# Patient Record
Sex: Male | Born: 1965
Health system: Southern US, Community
[De-identification: ages and names within clinical notes are randomized; demographics above are authoritative.]

## PROBLEM LIST (undated history)

## (undated) DIAGNOSIS — E785 Hyperlipidemia, unspecified: Secondary | ICD-10-CM

## (undated) DIAGNOSIS — F988 Other specified behavioral and emotional disorders with onset usually occurring in childhood and adolescence: Secondary | ICD-10-CM

## (undated) DIAGNOSIS — C4491 Basal cell carcinoma of skin, unspecified: Secondary | ICD-10-CM

## (undated) DIAGNOSIS — I10 Essential (primary) hypertension: Secondary | ICD-10-CM

## (undated) DIAGNOSIS — R0683 Snoring: Secondary | ICD-10-CM

## (undated) DIAGNOSIS — C44310 Basal cell carcinoma of skin of unspecified parts of face: Secondary | ICD-10-CM

## (undated) DIAGNOSIS — T7840XA Allergy, unspecified, initial encounter: Secondary | ICD-10-CM

## (undated) DIAGNOSIS — G4730 Sleep apnea, unspecified: Secondary | ICD-10-CM

## (undated) DIAGNOSIS — R739 Hyperglycemia, unspecified: Secondary | ICD-10-CM

## (undated) HISTORY — PX: EYE SURGERY: SHX253

## (undated) HISTORY — DX: Hyperglycemia, unspecified: R73.9

## (undated) HISTORY — DX: Allergy, unspecified, initial encounter: T78.40XA

## (undated) HISTORY — DX: Snoring: R06.83

## (undated) HISTORY — DX: Basal cell carcinoma of skin, unspecified: C44.91

## (undated) HISTORY — DX: Essential (primary) hypertension: I10

## (undated) HISTORY — DX: Basal cell carcinoma of skin of unspecified parts of face: C44.310

## (undated) HISTORY — DX: Hyperlipidemia, unspecified: E78.5

## (undated) HISTORY — DX: Other specified behavioral and emotional disorders with onset usually occurring in childhood and adolescence: F98.8

---

## 2011-11-13 LAB — PSA

## 2013-02-09 ENCOUNTER — Ambulatory Visit: Payer: Self-pay | Admitting: Specialist

## 2014-01-07 LAB — LIPID PANEL
Cholesterol: 215 mg/dL — AB (ref 0–200)
HDL: 66 mg/dL (ref 35–70)
LDL Cholesterol: 129 mg/dL
Triglycerides: 100 mg/dL (ref 40–160)

## 2014-01-07 LAB — HEMOGLOBIN A1C: Hgb A1c MFr Bld: 5.7 % (ref 4.0–6.0)

## 2014-05-05 HISTORY — PX: LASIK: SHX215

## 2014-10-18 DIAGNOSIS — D239 Other benign neoplasm of skin, unspecified: Secondary | ICD-10-CM

## 2014-10-18 HISTORY — DX: Other benign neoplasm of skin, unspecified: D23.9

## 2014-11-13 ENCOUNTER — Encounter: Payer: Self-pay | Admitting: Family Medicine

## 2014-11-13 DIAGNOSIS — F988 Other specified behavioral and emotional disorders with onset usually occurring in childhood and adolescence: Secondary | ICD-10-CM | POA: Insufficient documentation

## 2014-11-13 DIAGNOSIS — E785 Hyperlipidemia, unspecified: Secondary | ICD-10-CM | POA: Insufficient documentation

## 2014-11-13 DIAGNOSIS — J309 Allergic rhinitis, unspecified: Secondary | ICD-10-CM | POA: Insufficient documentation

## 2014-11-13 DIAGNOSIS — R739 Hyperglycemia, unspecified: Secondary | ICD-10-CM | POA: Insufficient documentation

## 2014-11-13 DIAGNOSIS — L259 Unspecified contact dermatitis, unspecified cause: Secondary | ICD-10-CM | POA: Insufficient documentation

## 2014-11-13 DIAGNOSIS — J302 Other seasonal allergic rhinitis: Secondary | ICD-10-CM | POA: Insufficient documentation

## 2014-11-13 DIAGNOSIS — J3089 Other allergic rhinitis: Secondary | ICD-10-CM | POA: Insufficient documentation

## 2014-11-13 DIAGNOSIS — I10 Essential (primary) hypertension: Secondary | ICD-10-CM | POA: Insufficient documentation

## 2014-11-13 DIAGNOSIS — C44310 Basal cell carcinoma of skin of unspecified parts of face: Secondary | ICD-10-CM | POA: Insufficient documentation

## 2014-11-15 ENCOUNTER — Ambulatory Visit (INDEPENDENT_AMBULATORY_CARE_PROVIDER_SITE_OTHER): Payer: 59 | Admitting: Family Medicine

## 2014-11-15 ENCOUNTER — Encounter: Payer: Self-pay | Admitting: Family Medicine

## 2014-11-15 VITALS — BP 122/72 | HR 92 | Temp 97.8°F | Resp 16 | Ht 70.0 in | Wt 202.9 lb

## 2014-11-15 DIAGNOSIS — I10 Essential (primary) hypertension: Secondary | ICD-10-CM

## 2014-11-15 DIAGNOSIS — F909 Attention-deficit hyperactivity disorder, unspecified type: Secondary | ICD-10-CM

## 2014-11-15 DIAGNOSIS — E785 Hyperlipidemia, unspecified: Secondary | ICD-10-CM

## 2014-11-15 DIAGNOSIS — F988 Other specified behavioral and emotional disorders with onset usually occurring in childhood and adolescence: Secondary | ICD-10-CM

## 2014-11-15 MED ORDER — AMPHETAMINE-DEXTROAMPHETAMINE 20 MG PO TABS: 20.0000 mg | ORAL_TABLET | Freq: Every day | ORAL | Status: DC

## 2014-11-15 MED ORDER — AMPHETAMINE-DEXTROAMPHETAMINE 10 MG PO TABS: 10.0000 mg | ORAL_TABLET | Freq: Every evening | ORAL | Status: DC

## 2014-11-15 MED ORDER — OLMESARTAN-AMLODIPINE-HCTZ 20-5-12.5 MG PO TABS: 1.0000 | ORAL_TABLET | Freq: Every day | ORAL | Status: DC

## 2014-11-15 MED ORDER — AMPHETAMINE-DEXTROAMPHETAMINE 10 MG PO TABS: 10.0000 mg | ORAL_TABLET | Freq: Every day | ORAL | Status: DC

## 2014-11-15 NOTE — Progress Notes (Signed)
Name: Victor Valdez   MRN: [id number]    DOB: [birth year]   Date:11/15/2014       Progress Note  Subjective  Chief Complaint  Chief Complaint  Patient presents with  . Hypertension  . ADHD    HPI  HTN: taking medications daily, denies side effects, bp is at goal.  Dyslipidemia: he is not on a statin, he is exercising more, riding bikes with his son four to five days weekly   ADD: doing well on medication, takes as prescribed, able to focus, feels like the dose is working well for him, no palpitation, it does decrease his appetite , but not significant.    Patient Active Problem List   Diagnosis Date Noted  . ADD (attention deficit disorder) 11/13/2014  . Basal cell carcinoma of face 11/13/2014  . CD (contact dermatitis) 11/13/2014  . Dyslipidemia 11/13/2014  . Blood glucose elevated 11/13/2014  . Benign hypertension 11/13/2014  . Perennial allergic rhinitis with seasonal variation 11/13/2014    Past Surgical History  Procedure Laterality Date  . Lasik Bilateral 12.30.2015    Family History  Problem Relation Age of Onset  . Hypertension Mother   . Cancer Father     Prostate    History   Social History  . Marital Status: Married    Spouse Name: N/A  . Number of Children: N/A  . Years of Education: N/A   Occupational History  . Not on file.   Social History Main Topics  . Smoking status: Never Smoker   . Smokeless tobacco: Never Used  . Alcohol Use: 0.0 oz/week    0 Standard drinks or equivalent per week  . Drug Use: No  . Sexual Activity: Yes   Other Topics Concern  . Not on file   Social History Narrative     Current outpatient prescriptions:  .  amphetamine-dextroamphetamine (ADDERALL) 10 MG tablet, Take 1 tablet (10 mg total) by mouth every evening., Disp: 30 tablet, Rfl: 0 .  amphetamine-dextroamphetamine (ADDERALL) 20 MG tablet, Take 1 tablet (20 mg total) by mouth daily., Disp: 30 tablet, Rfl: 0 .  cetirizine (ZYRTEC) 10 MG tablet, Take 1  tablet by mouth daily., Disp: , Rfl:  .  mometasone (NASONEX) 50 MCG/ACT nasal spray, Place 2 sprays into the nose at bedtime., Disp: , Rfl:  .  Olmesartan-Amlodipine-HCTZ (TRIBENZOR) 20-5-12.5 MG TABS, Take 1 tablet by mouth daily., Disp: 90 tablet, Rfl: 4 .  amphetamine-dextroamphetamine (ADDERALL) 10 MG tablet, Take 1 tablet (10 mg total) by mouth daily at 2 PM daily at 2 PM., Disp: 30 tablet, Rfl: 0 .  amphetamine-dextroamphetamine (ADDERALL) 10 MG tablet, Take 1 tablet (10 mg total) by mouth daily at 2 PM daily at 2 PM., Disp: 30 tablet, Rfl: 0 .  amphetamine-dextroamphetamine (ADDERALL) 20 MG tablet, Take 1 tablet (20 mg total) by mouth daily., Disp: 30 tablet, Rfl: 0 .  amphetamine-dextroamphetamine (ADDERALL) 20 MG tablet, Take 1 tablet (20 mg total) by mouth daily., Disp: 30 tablet, Rfl: 0  Allergies  Allergen Reactions  . Aspirin   . Penicillins      ROS  Constitutional: Negative for fever or weight change.  Respiratory: Negative for cough and shortness of breath.   Cardiovascular: Negative for chest pain or palpitations.  Gastrointestinal: Negative for abdominal pain, no bowel changes.  Musculoskeletal: Negative for gait problem or joint swelling.  Skin: Negative for rash.  Neurological: Negative for dizziness or headache.  No other specific complaints in a complete review of systems (except  as listed in HPI above).  Objective  Filed Vitals:   11/15/14 0810  BP: 122/72  Pulse: 92  Temp: 97.8 F (36.6 C)  TempSrc: Oral  Resp: 16  Height: 5\' 10"  (1.778 m)  Weight: 202 lb 14.4 oz (92.035 kg)  SpO2: 97%    Body mass index is 29.11 kg/(m^2).  Physical Exam  Constitutional: Patient appears well-developed and well-nourished. No distress.  Eyes:  No scleral icterus. PERL Neck: Normal range of motion. Neck supple. Cardiovascular: Normal rate, regular rhythm and normal heart sounds.  No murmur heard. No BLE edema. Pulmonary/Chest: Effort normal and breath sounds  normal. No respiratory distress. Abdominal: Soft.  There is no tenderness. Psychiatric: Patient has a normal mood and affect. behavior is normal. Judgment and thought content normal.  PHQ2/9: Depression screen PHQ 2/9 11/15/2014  Decreased Interest 0  Down, Depressed, Hopeless 0  PHQ - 2 Score 0     Fall Risk: Fall Risk  11/15/2014  Falls in the past year? No    Assessment & Plan    1. Benign hypertension  - Olmesartan-Amlodipine-HCTZ (TRIBENZOR) 20-5-12.5 MG TABS; Take 1 tablet by mouth daily.  Dispense: 90 tablet; Refill: 4  2. Dyslipidemia  Discussed diet and exercise   3. ADD (attention deficit disorder)  - amphetamine-dextroamphetamine (ADDERALL) 20 MG tablet; Take 1 tablet (20 mg total) by mouth daily.  Dispense: 30 tablet; Refill: 0 - amphetamine-dextroamphetamine (ADDERALL) 10 MG tablet; Take 1 tablet (10 mg total) by mouth every evening.  Dispense: 30 tablet; Refill: 0 - amphetamine-dextroamphetamine (ADDERALL) 10 MG tablet; Take 1 tablet (10 mg total) by mouth daily at 2 PM daily at 2 PM.  Dispense: 30 tablet; Refill: 0 - amphetamine-dextroamphetamine (ADDERALL) 20 MG tablet; Take 1 tablet (20 mg total) by mouth daily.  Dispense: 30 tablet; Refill: 0 - amphetamine-dextroamphetamine (ADDERALL) 10 MG tablet; Take 1 tablet (10 mg total) by mouth daily at 2 PM daily at 2 PM.  Dispense: 30 tablet; Refill: 0 - amphetamine-dextroamphetamine (ADDERALL) 20 MG tablet; Take 1 tablet (20 mg total) by mouth daily.  Dispense: 30 tablet; Refill: 0

## 2014-11-15 NOTE — Addendum Note (Signed)
Addended by: Steele Sizer F on: 11/15/2014 08:39 AM   Modules accepted: Orders, SmartSet

## 2015-01-11 ENCOUNTER — Telehealth: Payer: Self-pay | Admitting: Family Medicine

## 2015-01-11 ENCOUNTER — Other Ambulatory Visit: Payer: Self-pay

## 2015-01-11 DIAGNOSIS — F988 Other specified behavioral and emotional disorders with onset usually occurring in childhood and adolescence: Secondary | ICD-10-CM

## 2015-01-11 NOTE — Telephone Encounter (Signed)
Pt states Aug had 31 days and he needs a refill on Adderell and needs to be able to get this on Friday instead of Saturday to be called into [facility].

## 2015-02-14 ENCOUNTER — Encounter: Payer: Self-pay | Admitting: Family Medicine

## 2015-02-14 ENCOUNTER — Ambulatory Visit (INDEPENDENT_AMBULATORY_CARE_PROVIDER_SITE_OTHER): Payer: 59 | Admitting: Family Medicine

## 2015-02-14 VITALS — BP 106/64 | HR 102 | Temp 98.1°F | Resp 18 | Ht 70.0 in | Wt 199.7 lb

## 2015-02-14 DIAGNOSIS — J309 Allergic rhinitis, unspecified: Secondary | ICD-10-CM

## 2015-02-14 DIAGNOSIS — J3089 Other allergic rhinitis: Secondary | ICD-10-CM

## 2015-02-14 DIAGNOSIS — I10 Essential (primary) hypertension: Secondary | ICD-10-CM | POA: Diagnosis not present

## 2015-02-14 DIAGNOSIS — Z23 Encounter for immunization: Secondary | ICD-10-CM

## 2015-02-14 DIAGNOSIS — E785 Hyperlipidemia, unspecified: Secondary | ICD-10-CM | POA: Diagnosis not present

## 2015-02-14 DIAGNOSIS — F909 Attention-deficit hyperactivity disorder, unspecified type: Secondary | ICD-10-CM | POA: Diagnosis not present

## 2015-02-14 DIAGNOSIS — F988 Other specified behavioral and emotional disorders with onset usually occurring in childhood and adolescence: Secondary | ICD-10-CM

## 2015-02-14 DIAGNOSIS — J302 Other seasonal allergic rhinitis: Secondary | ICD-10-CM

## 2015-02-14 MED ORDER — AMPHETAMINE-DEXTROAMPHETAMINE 10 MG PO TABS: 10.0000 mg | ORAL_TABLET | Freq: Every day | ORAL | Status: DC

## 2015-02-14 MED ORDER — AMPHETAMINE-DEXTROAMPHETAMINE 10 MG PO TABS: 10.0000 mg | ORAL_TABLET | Freq: Every evening | ORAL | Status: DC

## 2015-02-14 MED ORDER — AMPHETAMINE-DEXTROAMPHETAMINE 20 MG PO TABS: 20.0000 mg | ORAL_TABLET | Freq: Every day | ORAL | Status: DC

## 2015-02-14 NOTE — Progress Notes (Signed)
Name: Victor Valdez   MRN: [id number]    DOB: 1966-01-17   Date:02/14/2015       Progress Note  Subjective  Chief Complaint  Chief Complaint  Patient presents with  . Medication Refill    follow-up 3onths  . ADHD  . Hypertension  . Stye    right eye noticed this am no pain    HPI  Hypertension: taking medication and denies side effects, no chest pain or SOB.  Not checking bp at home   ADHD: taking Adderal daily and is able to concentrate and focus, no palpitation, no side effects. No change in appetite  Eye lid problem: noticed a red bump on right lower eye lid, he noticed it a couple of days ago, non-tender  AR: currently under control, no nasal congestion, itchy eyes, or rhinorrhea, he has been off allergy medications.   Patient Active Problem List   Diagnosis Date Noted  . ADD (attention deficit disorder) 11/13/2014  . Basal cell carcinoma of face 11/13/2014  . CD (contact dermatitis) 11/13/2014  . Dyslipidemia 11/13/2014  . Blood glucose elevated 11/13/2014  . Benign hypertension 11/13/2014  . Perennial allergic rhinitis with seasonal variation 11/13/2014    Past Surgical History  Procedure Laterality Date  . Lasik Bilateral 12.30.2015    Family History  Problem Relation Age of Onset  . Hypertension Mother   . Cancer Father     Prostate    Social History   Social History  . Marital Status: Married    Spouse Name: N/A  . Number of Children: N/A  . Years of Education: N/A   Occupational History  . Not on file.   Social History Main Topics  . Smoking status: Never Smoker   . Smokeless tobacco: Never Used  . Alcohol Use: 0.0 oz/week    0 Standard drinks or equivalent per week  . Drug Use: No  . Sexual Activity: Yes   Other Topics Concern  . Not on file   Social History Narrative     Current outpatient prescriptions:  .  amphetamine-dextroamphetamine (ADDERALL) 10 MG tablet, Take 1 tablet (10 mg total) by mouth every evening., Disp: 30  tablet, Rfl: 0 .  amphetamine-dextroamphetamine (ADDERALL) 10 MG tablet, Take 1 tablet (10 mg total) by mouth daily at 2 PM daily at 2 PM., Disp: 30 tablet, Rfl: 0 .  amphetamine-dextroamphetamine (ADDERALL) 10 MG tablet, Take 1 tablet (10 mg total) by mouth daily at 2 PM daily at 2 PM., Disp: 30 tablet, Rfl: 0 .  amphetamine-dextroamphetamine (ADDERALL) 20 MG tablet, Take 1 tablet (20 mg total) by mouth daily., Disp: 30 tablet, Rfl: 0 .  amphetamine-dextroamphetamine (ADDERALL) 20 MG tablet, Take 1 tablet (20 mg total) by mouth daily., Disp: 30 tablet, Rfl: 0 .  amphetamine-dextroamphetamine (ADDERALL) 20 MG tablet, Take 1 tablet (20 mg total) by mouth daily., Disp: 30 tablet, Rfl: 0 .  cetirizine (ZYRTEC) 10 MG tablet, Take 1 tablet by mouth daily., Disp: , Rfl:  .  mometasone (NASONEX) 50 MCG/ACT nasal spray, Place 2 sprays into the nose at bedtime., Disp: , Rfl:  .  Olmesartan-Amlodipine-HCTZ (TRIBENZOR) 20-5-12.5 MG TABS, Take 1 tablet by mouth daily., Disp: 90 tablet, Rfl: 4  Allergies  Allergen Reactions  . Aspirin   . Penicillins      ROS  Constitutional: Negative for fever or weight change.  Respiratory: Negative for cough and shortness of breath.   Cardiovascular: Negative for chest pain or palpitations.  Gastrointestinal: Negative for abdominal pain,  no bowel changes.  Musculoskeletal: Negative for gait problem or joint swelling.  Skin: Negative for rash.  Neurological: Negative for dizziness or headache.  No other specific complaints in a complete review of systems (except as listed in HPI above).  Objective  Filed Vitals:   02/14/15 0927  BP: 106/64  Pulse: 102  Temp: 98.1 F (36.7 C)  TempSrc: Oral  Resp: 18  Height: 5\' 10"  (1.778 m)  Weight: 199 lb 11.2 oz (90.583 kg)  SpO2: 98%    Body mass index is 28.65 kg/(m^2).  Physical Exam  Constitutional: Patient appears well-developed and well-nourished. No distress.  HEENT: head atraumatic, normocephalic,  pupils equal and reactive to light,  neck supple, throat within normal limits, erythematous bump on right lower lid, non - tender, advised to monitor and if no resolution go to dermatologist since he has a history of basal cell carcinoma of face Cardiovascular: Normal rate, regular rhythm and normal heart sounds.  No murmur heard. No BLE edema. Pulmonary/Chest: Effort normal and breath sounds normal. No respiratory distress. Abdominal: Soft.  There is no tenderness. Psychiatric: Patient has a normal mood and affect. behavior is normal. Judgment and thought content normal.  PHQ2/9: Depression screen [facility] 2/9 02/14/2015 11/15/2014  Decreased Interest 0 0  Down, Depressed, Hopeless 0 0  PHQ - 2 Score 0 0    Fall Risk: Fall Risk  02/14/2015 11/15/2014  Falls in the past year? No No     Functional Status Survey: Is the patient deaf or have difficulty hearing?: No Does the patient have difficulty seeing, even when wearing glasses/contacts?: Yes (reading glasses) Does the patient have difficulty concentrating, remembering, or making decisions?: No Does the patient have difficulty walking or climbing stairs?: No Does the patient have difficulty dressing or bathing?: No Does the patient have difficulty doing errands alone such as visiting a doctor's office or shopping?: No    Assessment & Plan  1. Benign hypertension  At goal   2. Needs flu shot  - Flu Vaccine QUAD 36+ mos PF IM (Fluarix & Fluzone Quad PF)  3. Perennial allergic rhinitis with seasonal variation  Under control   4. ADD (attention deficit disorder)  Controlled  - amphetamine-dextroamphetamine (ADDERALL) 10 MG tablet; Take 1 tablet (10 mg total) by mouth every evening.  Dispense: 30 tablet; Refill: 0 - amphetamine-dextroamphetamine (ADDERALL) 10 MG tablet; Take 1 tablet (10 mg total) by mouth daily at 2 PM daily at 2 PM.  Dispense: 30 tablet; Refill: 0 - amphetamine-dextroamphetamine (ADDERALL) 10 MG tablet; Take 1  tablet (10 mg total) by mouth daily at 2 PM daily at 2 PM.  Dispense: 30 tablet; Refill: 0 - amphetamine-dextroamphetamine (ADDERALL) 20 MG tablet; Take 1 tablet (20 mg total) by mouth daily.  Dispense: 30 tablet; Refill: 0 - amphetamine-dextroamphetamine (ADDERALL) 20 MG tablet; Take 1 tablet (20 mg total) by mouth daily.  Dispense: 30 tablet; Refill: 0 - amphetamine-dextroamphetamine (ADDERALL) 20 MG tablet; Take 1 tablet (20 mg total) by mouth daily.  Dispense: 30 tablet; Refill: 0  5. Dyslipidemia  He had labs done at work and will fax results, but not fasting that day, he will returning fasting for his next follow up

## 2015-05-13 ENCOUNTER — Encounter: Payer: Self-pay | Admitting: Family Medicine

## 2015-05-13 ENCOUNTER — Ambulatory Visit (INDEPENDENT_AMBULATORY_CARE_PROVIDER_SITE_OTHER): Payer: 59 | Admitting: Family Medicine

## 2015-05-13 VITALS — BP 128/82 | HR 86 | Temp 97.8°F | Resp 16 | Ht 70.0 in | Wt 203.7 lb

## 2015-05-13 DIAGNOSIS — I10 Essential (primary) hypertension: Secondary | ICD-10-CM | POA: Diagnosis not present

## 2015-05-13 DIAGNOSIS — F988 Other specified behavioral and emotional disorders with onset usually occurring in childhood and adolescence: Secondary | ICD-10-CM

## 2015-05-13 DIAGNOSIS — F909 Attention-deficit hyperactivity disorder, unspecified type: Secondary | ICD-10-CM | POA: Diagnosis not present

## 2015-05-13 DIAGNOSIS — J069 Acute upper respiratory infection, unspecified: Secondary | ICD-10-CM

## 2015-05-13 MED ORDER — AMPHETAMINE-DEXTROAMPHETAMINE 20 MG PO TABS: 20.0000 mg | ORAL_TABLET | Freq: Every day | ORAL | Status: DC

## 2015-05-13 MED ORDER — AMPHETAMINE-DEXTROAMPHETAMINE 10 MG PO TABS: 10.0000 mg | ORAL_TABLET | Freq: Every evening | ORAL | Status: DC

## 2015-05-13 MED ORDER — AMPHETAMINE-DEXTROAMPHETAMINE 10 MG PO TABS: 10.0000 mg | ORAL_TABLET | Freq: Every day | ORAL | Status: DC

## 2015-05-13 NOTE — Progress Notes (Signed)
Name: Victor Valdez   MRN: [id number]    DOB: Aug 02, 1965   Date:05/13/2015       Progress Note  Subjective  Chief Complaint  Chief Complaint  Patient presents with  . Hypertension    patient is here for a 34-month follow-up  . URI    patient states that he thinks he has a head cold and has taken some Sudafed.    HPI  HTN: patient is taking medication as prescribed and bp has been at goal, no chest pain, no SOB or palpitation  ADD: patient is doing well on medication, he denies side effects of medication, able to focus at work on medication. He takes second dose in the afternoon and is able to stay focused until 6 pm.  No problems following asleep at night.   URI: he states he developed nasal congestion, rhinorrhea , nasal drainage is green at times. Facial pressure, mild post-nasal drainage. No fever, no body aches. No cough. He is taking Sudafed otc with some improvement of symptoms.    Patient Active Problem List   Diagnosis Date Noted  . ADD (attention deficit disorder) 11/13/2014  . Basal cell carcinoma of face 11/13/2014  . CD (contact dermatitis) 11/13/2014  . Dyslipidemia 11/13/2014  . Blood glucose elevated 11/13/2014  . Benign hypertension 11/13/2014  . Perennial allergic rhinitis with seasonal variation 11/13/2014    Past Surgical History  Procedure Laterality Date  . Lasik Bilateral 12.30.2015    Family History  Problem Relation Age of Onset  . Hypertension Mother   . Cancer Father     Prostate    Social History   Social History  . Marital Status: Married    Spouse Name: N/A  . Number of Children: N/A  . Years of Education: N/A   Occupational History  . Not on file.   Social History Main Topics  . Smoking status: Never Smoker   . Smokeless tobacco: Never Used  . Alcohol Use: 0.0 oz/week    0 Standard drinks or equivalent per week  . Drug Use: No  . Sexual Activity: Yes   Other Topics Concern  . Not on file   Social History Narrative      Current outpatient prescriptions:  .  amphetamine-dextroamphetamine (ADDERALL) 10 MG tablet, Take 1 tablet (10 mg total) by mouth every evening., Disp: 30 tablet, Rfl: 0 .  amphetamine-dextroamphetamine (ADDERALL) 10 MG tablet, Take 1 tablet (10 mg total) by mouth daily at 2 PM., Disp: 30 tablet, Rfl: 0 .  amphetamine-dextroamphetamine (ADDERALL) 10 MG tablet, Take 1 tablet (10 mg total) by mouth daily at 2 PM., Disp: 30 tablet, Rfl: 0 .  amphetamine-dextroamphetamine (ADDERALL) 20 MG tablet, Take 1 tablet (20 mg total) by mouth daily., Disp: 30 tablet, Rfl: 0 .  amphetamine-dextroamphetamine (ADDERALL) 20 MG tablet, Take 1 tablet (20 mg total) by mouth daily., Disp: 30 tablet, Rfl: 0 .  amphetamine-dextroamphetamine (ADDERALL) 20 MG tablet, Take 1 tablet (20 mg total) by mouth daily., Disp: 30 tablet, Rfl: 0 .  cetirizine (ZYRTEC) 10 MG tablet, Take 1 tablet by mouth daily., Disp: , Rfl:  .  mometasone (NASONEX) 50 MCG/ACT nasal spray, Place 2 sprays into the nose at bedtime., Disp: , Rfl:  .  Olmesartan-Amlodipine-HCTZ (TRIBENZOR) 20-5-12.5 MG TABS, Take 1 tablet by mouth daily., Disp: 90 tablet, Rfl: 4  Allergies  Allergen Reactions  . Aspirin   . Penicillins      ROS  Constitutional: Negative for fever or weight change.  Respiratory:  Negative for cough and shortness of breath.   Cardiovascular: Negative for chest pain or palpitations.  Gastrointestinal: Negative for abdominal pain, no bowel changes.  Musculoskeletal: Negative for gait problem or joint swelling.  Skin: Negative for rash.  Neurological: Negative for dizziness or headache.  No other specific complaints in a complete review of systems (except as listed in HPI above).  Objective  Filed Vitals:   05/13/15 0842  BP: 128/82  Pulse: 86  Temp: 97.8 F (36.6 C)  TempSrc: Oral  Resp: 16  Height: 5\' 10"  (1.778 m)  Weight: 203 lb 11.2 oz (92.398 kg)  SpO2: 97%    Body mass index is 29.23  kg/(m^2).  Physical Exam  Constitutional: Patient appears well-developed and well-nourished. Obese No distress.  HEENT: head atraumatic, normocephalic, pupils equal and reactive to light, neck supple, throat within normal limits, Tender maxillary sinus Cardiovascular: Normal rate, regular rhythm and normal heart sounds.  No murmur heard. No BLE edema. Pulmonary/Chest: Effort normal and breath sounds normal. No respiratory distress. Abdominal: Soft.  There is no tenderness. Psychiatric: Patient has a normal mood and affect. behavior is normal. Judgment and thought content normal.   PHQ2/9: Depression screen [facility] 2/9 05/13/2015 02/14/2015 11/15/2014  Decreased Interest 0 0 0  Down, Depressed, Hopeless 0 0 0  PHQ - 2 Score 0 0 0    Fall Risk: Fall Risk  05/13/2015 02/14/2015 11/15/2014  Falls in the past year? No No No    Functional Status Survey: Is the patient deaf or have difficulty hearing?: No Does the patient have difficulty seeing, even when wearing glasses/contacts?: No Does the patient have difficulty concentrating, remembering, or making decisions?: No Does the patient have difficulty walking or climbing stairs?: No Does the patient have difficulty dressing or bathing?: No Does the patient have difficulty doing errands alone such as visiting a doctor's office or shopping?: No   Assessment & Plan  1. ADD (attention deficit disorder)  - amphetamine-dextroamphetamine (ADDERALL) 20 MG tablet; Take 1 tablet (20 mg total) by mouth daily.  Dispense: 30 tablet; Refill: 0 - amphetamine-dextroamphetamine (ADDERALL) 10 MG tablet; Take 1 tablet (10 mg total) by mouth every evening.  Dispense: 30 tablet; Refill: 0 - amphetamine-dextroamphetamine (ADDERALL) 10 MG tablet; Take 1 tablet (10 mg total) by mouth daily at 2 PM.  Dispense: 30 tablet; Refill: 0 - amphetamine-dextroamphetamine (ADDERALL) 10 MG tablet; Take 1 tablet (10 mg total) by mouth daily at 2 PM.  Dispense: 30 tablet; Refill:  0 - amphetamine-dextroamphetamine (ADDERALL) 20 MG tablet; Take 1 tablet (20 mg total) by mouth daily.  Dispense: 30 tablet; Refill: 0 - amphetamine-dextroamphetamine (ADDERALL) 20 MG tablet; Take 1 tablet (20 mg total) by mouth daily.  Dispense: 30 tablet; Refill: 0  2. Benign hypertension  Doing well on medication   3. Upper respiratory infection  Continue otc medication

## 2015-05-23 ENCOUNTER — Telehealth: Payer: Self-pay

## 2015-05-23 NOTE — Telephone Encounter (Signed)
Spoke with patient about [facility] denying his Amphetamine/Dextroamphetamine prescription due to it needing to be written by or in consultation with a mental health specialist. Patient states he does not go to one currently and received the letter as well but would rather just pay cash, instead of going to a mental health provider.

## 2015-06-29 ENCOUNTER — Ambulatory Visit (INDEPENDENT_AMBULATORY_CARE_PROVIDER_SITE_OTHER): Payer: 59 | Admitting: Family Medicine

## 2015-06-29 ENCOUNTER — Encounter: Payer: Self-pay | Admitting: Family Medicine

## 2015-06-29 VITALS — BP 120/78 | HR 88 | Temp 97.6°F | Resp 16 | Wt 199.5 lb

## 2015-06-29 DIAGNOSIS — I10 Essential (primary) hypertension: Secondary | ICD-10-CM | POA: Diagnosis not present

## 2015-06-29 DIAGNOSIS — Z0000 Encounter for general adult medical examination without abnormal findings: Secondary | ICD-10-CM | POA: Diagnosis not present

## 2015-06-29 NOTE — Progress Notes (Signed)
Name: Pietro Moffet   MRN: [id number]    DOB: 01/10/1966   Date:06/29/2015       Progress Note  Subjective  Chief Complaint  Chief Complaint  Patient presents with  . Annual Exam    patient has to have surgery on his right eyelid to remove basal cell cancer.    HPI  Male Exam: he is feeling well, lost almost 5 lbs since last visit. He is feeling well. No chest pain or SOB. He has basal cell cancer and will have surgery next week at [facility] ( Dr. Lacinda Axon -dermatologist and Dr. Ellin Mayhew is the plastic surgeon )   Patient Active Problem List   Diagnosis Date Noted  . ADD (attention deficit disorder) 11/13/2014  . Basal cell carcinoma of face 11/13/2014  . CD (contact dermatitis) 11/13/2014  . Dyslipidemia 11/13/2014  . Blood glucose elevated 11/13/2014  . Benign hypertension 11/13/2014  . Perennial allergic rhinitis with seasonal variation 11/13/2014    Past Surgical History  Procedure Laterality Date  . Lasik Bilateral 12.30.2015    Family History  Problem Relation Age of Onset  . Hypertension Mother   . Cancer Father     Prostate    Social History   Social History  . Marital Status: Married    Spouse Name: N/A  . Number of Children: N/A  . Years of Education: N/A   Occupational History  . Not on file.   Social History Main Topics  . Smoking status: Never Smoker   . Smokeless tobacco: Never Used  . Alcohol Use: 0.0 oz/week    0 Standard drinks or equivalent per week  . Drug Use: No  . Sexual Activity: Yes   Other Topics Concern  . Not on file   Social History Narrative     Current outpatient prescriptions:  .  amphetamine-dextroamphetamine (ADDERALL) 10 MG tablet, Take 1 tablet (10 mg total) by mouth every evening., Disp: 30 tablet, Rfl: 0 .  amphetamine-dextroamphetamine (ADDERALL) 10 MG tablet, Take 1 tablet (10 mg total) by mouth daily at 2 PM., Disp: 30 tablet, Rfl: 0 .  amphetamine-dextroamphetamine (ADDERALL) 10 MG tablet, Take 1 tablet (10 mg total)  by mouth daily at 2 PM., Disp: 30 tablet, Rfl: 0 .  amphetamine-dextroamphetamine (ADDERALL) 20 MG tablet, Take 1 tablet (20 mg total) by mouth daily., Disp: 30 tablet, Rfl: 0 .  amphetamine-dextroamphetamine (ADDERALL) 20 MG tablet, Take 1 tablet (20 mg total) by mouth daily., Disp: 30 tablet, Rfl: 0 .  amphetamine-dextroamphetamine (ADDERALL) 20 MG tablet, Take 1 tablet (20 mg total) by mouth daily., Disp: 30 tablet, Rfl: 0 .  cetirizine (ZYRTEC) 10 MG tablet, Take 1 tablet by mouth daily., Disp: , Rfl:  .  mometasone (NASONEX) 50 MCG/ACT nasal spray, Place 2 sprays into the nose at bedtime., Disp: , Rfl:  .  Olmesartan-Amlodipine-HCTZ (TRIBENZOR) 20-5-12.5 MG TABS, Take 1 tablet by mouth daily., Disp: 90 tablet, Rfl: 4  Allergies  Allergen Reactions  . Aspirin   . Penicillins      ROS  Constitutional: Negative for fever, positive for  weight change - loss.  Respiratory: Negative for cough and shortness of breath.   Cardiovascular: Negative for chest pain or palpitations.  Gastrointestinal: Negative for abdominal pain, no bowel changes.  Musculoskeletal: Negative for gait problem or joint swelling.  Skin: Negative for rash. Skin lesion - basal lower right eyelid Neurological: Negative for dizziness or headache.  No other specific complaints in a complete review of systems (except as listed  in HPI above).  Objective  Filed Vitals:   06/29/15 0827  BP: 120/78  Pulse: 88  Temp: 97.6 F (36.4 C)  TempSrc: Oral  Resp: 16  Weight: 199 lb 8 oz (90.493 kg)  SpO2: 96%    Body mass index is 28.63 kg/(m^2).  Physical Exam  Constitutional: Patient appears well-developed and well-nourished, overweight. No distress.  HENT: Head: Normocephalic and atraumatic. Ears: B TMs ok, no erythema or effusion; Nose: Nose normal. Mouth/Throat: Oropharynx is clear and moist. No oropharyngeal exudate.  Eyes: Conjunctivae and EOM are normal. Pupils are equal, round, and reactive to light. No  scleral icterus.  Neck: Normal range of motion. Neck supple. No JVD present. No thyromegaly present.  Cardiovascular: Normal rate, regular rhythm and normal heart sounds.  No murmur heard. No BLE edema. Pulmonary/Chest: Effort normal and breath sounds normal. No respiratory distress. Abdominal: Soft. Bowel sounds are normal, no distension. There is no tenderness. no masses MALE GENITALIA: Normal descended testes bilaterally, no masses palpated, no hernias, no lesions, no discharge RECTAL: Prostate normal size and consistency, no rectal masses or hemorrhoids Musculoskeletal: Normal range of motion, no joint effusions. No gross deformities Neurological: he is alert and oriented to person, place, and time. No cranial nerve deficit. Coordination, balance, strength, speech and gait are normal.  Skin: Skin is warm and dry. No rash noted. No erythema.  Psychiatric: Patient has a normal mood and affect. behavior is normal. Judgment and thought content normal.   PHQ2/9: Depression screen [facility] 2/9 06/29/2015 05/13/2015 02/14/2015 11/15/2014  Decreased Interest 0 0 0 0  Down, Depressed, Hopeless 0 0 0 0  PHQ - 2 Score 0 0 0 0    Fall Risk: Fall Risk  06/29/2015 05/13/2015 02/14/2015 11/15/2014  Falls in the past year? No No No No    Functional Status Survey: Is the patient deaf or have difficulty hearing?: No Does the patient have difficulty seeing, even when wearing glasses/contacts?: No Does the patient have difficulty concentrating, remembering, or making decisions?: No Does the patient have difficulty walking or climbing stairs?: No Does the patient have difficulty dressing or bathing?: No Does the patient have difficulty doing errands alone such as visiting a doctor's office or shopping?: No    Assessment & Plan  1. Encounter for routine history and physical exam for male  Discussed importance of 150 minutes of physical activity weekly, eat two servings of fish weekly, eat one serving of tree  nuts ( cashews, pistachios, pecans, almonds.[name]) every other day, eat 6 servings of fruit/vegetables daily and drink plenty of water and avoid sweet beverages.   2. Benign hypertension  Doing well.  - EKG 12-Lead

## 2015-08-02 ENCOUNTER — Encounter: Payer: Self-pay | Admitting: Family Medicine

## 2015-08-08 ENCOUNTER — Ambulatory Visit (INDEPENDENT_AMBULATORY_CARE_PROVIDER_SITE_OTHER): Payer: 59 | Admitting: Family Medicine

## 2015-08-08 ENCOUNTER — Encounter: Payer: Self-pay | Admitting: Family Medicine

## 2015-08-08 VITALS — BP 122/76 | HR 95 | Temp 98.1°F | Resp 16 | Wt 204.5 lb

## 2015-08-08 DIAGNOSIS — F909 Attention-deficit hyperactivity disorder, unspecified type: Secondary | ICD-10-CM | POA: Diagnosis not present

## 2015-08-08 DIAGNOSIS — J302 Other seasonal allergic rhinitis: Secondary | ICD-10-CM

## 2015-08-08 DIAGNOSIS — E785 Hyperlipidemia, unspecified: Secondary | ICD-10-CM

## 2015-08-08 DIAGNOSIS — I10 Essential (primary) hypertension: Secondary | ICD-10-CM | POA: Diagnosis not present

## 2015-08-08 DIAGNOSIS — J309 Allergic rhinitis, unspecified: Secondary | ICD-10-CM

## 2015-08-08 DIAGNOSIS — F988 Other specified behavioral and emotional disorders with onset usually occurring in childhood and adolescence: Secondary | ICD-10-CM

## 2015-08-08 DIAGNOSIS — J3089 Other allergic rhinitis: Secondary | ICD-10-CM

## 2015-08-08 MED ORDER — AMPHETAMINE-DEXTROAMPHETAMINE 20 MG PO TABS: 20.0000 mg | ORAL_TABLET | Freq: Every day | ORAL | Status: DC

## 2015-08-08 MED ORDER — AMPHETAMINE-DEXTROAMPHETAMINE 10 MG PO TABS: 10.0000 mg | ORAL_TABLET | Freq: Every day | ORAL | Status: DC

## 2015-08-08 MED ORDER — AMPHETAMINE-DEXTROAMPHETAMINE 10 MG PO TABS
10.0000 mg | ORAL_TABLET | Freq: Every evening | ORAL | Status: DC
Start: 2015-08-08 — End: 2015-10-28

## 2015-08-08 NOTE — Progress Notes (Signed)
Name: Victor Valdez   MRN: [id number]    DOB: Sep 07, 1965   Date:08/08/2015       Progress Note  Subjective  Chief Complaint  Chief Complaint  Patient presents with  . ADD  . Hypertension  . Medication Refill    HPI  HTN: patient is taking medication as prescribed and bp has been at goal, no chest pain, no SOB or palpitation  ADD: patient is doing well on medication, he denies side effects of medication, able to focus at work on medication. He takes second dose in the afternoon and is able to stay focused until 6 pm. No problems following asleep at night. No tachycardia, bp has been at goal  Basal cell Carcinoma of face: just had it removed from right lower eye lid 2 weeks ago, doing well, having sutures removed next week  AR: she is not having any symptoms at this time, no nasal congestion or rhinorrhea   Patient Active Problem List   Diagnosis Date Noted  . ADD (attention deficit disorder) 11/13/2014  . Basal cell carcinoma of face 11/13/2014  . CD (contact dermatitis) 11/13/2014  . Dyslipidemia 11/13/2014  . Blood glucose elevated 11/13/2014  . Benign hypertension 11/13/2014  . Perennial allergic rhinitis with seasonal variation 11/13/2014    Past Surgical History  Procedure Laterality Date  . Lasik Bilateral 12.30.2015    Family History  Problem Relation Age of Onset  . Hypertension Mother   . Cancer Father     Prostate    Social History   Social History  . Marital Status: Married    Spouse Name: N/A  . Number of Children: N/A  . Years of Education: N/A   Occupational History  . Not on file.   Social History Main Topics  . Smoking status: Never Smoker   . Smokeless tobacco: Never Used  . Alcohol Use: 0.0 oz/week    0 Standard drinks or equivalent per week  . Drug Use: No  . Sexual Activity: Yes   Other Topics Concern  . Not on file   Social History Narrative     Current outpatient prescriptions:  .  amphetamine-dextroamphetamine (ADDERALL)  10 MG tablet, Take 1 tablet (10 mg total) by mouth every evening., Disp: 30 tablet, Rfl: 0 .  amphetamine-dextroamphetamine (ADDERALL) 10 MG tablet, Take 1 tablet (10 mg total) by mouth daily at 2 PM., Disp: 30 tablet, Rfl: 0 .  amphetamine-dextroamphetamine (ADDERALL) 10 MG tablet, Take 1 tablet (10 mg total) by mouth daily at 2 PM., Disp: 30 tablet, Rfl: 0 .  amphetamine-dextroamphetamine (ADDERALL) 20 MG tablet, Take 1 tablet (20 mg total) by mouth daily., Disp: 30 tablet, Rfl: 0 .  amphetamine-dextroamphetamine (ADDERALL) 20 MG tablet, Take 1 tablet (20 mg total) by mouth daily., Disp: 30 tablet, Rfl: 0 .  amphetamine-dextroamphetamine (ADDERALL) 20 MG tablet, Take 1 tablet (20 mg total) by mouth daily., Disp: 30 tablet, Rfl: 0 .  cetirizine (ZYRTEC) 10 MG tablet, Take 1 tablet by mouth daily., Disp: , Rfl:  .  mometasone (NASONEX) 50 MCG/ACT nasal spray, Place 2 sprays into the nose at bedtime., Disp: , Rfl:  .  Olmesartan-Amlodipine-HCTZ (TRIBENZOR) 20-5-12.5 MG TABS, Take 1 tablet by mouth daily., Disp: 90 tablet, Rfl: 4  Allergies  Allergen Reactions  . Aspirin   . Penicillins      ROS  Ten systems reviewed and is negative except as mentioned in HPI   Objective  Filed Vitals:   08/08/15 0956  BP: 122/76  Pulse:  95  Temp: 98.1 F (36.7 C)  TempSrc: Oral  Resp: 16  Weight: 204 lb 8 oz (92.761 kg)  SpO2: 97%    Body mass index is 29.34 kg/(m^2).  Physical Exam  Constitutional: Patient appears well-developed and well-nourished.  No distress.  HEENT: head atraumatic, normocephalic, pupils equal and reactive to light,  neck supple, throat within normal limits Cardiovascular: Normal rate, regular rhythm and normal heart sounds.  No murmur heard. No BLE edema. Pulmonary/Chest: Effort normal and breath sounds normal. No respiratory distress. Abdominal: Soft.  There is no tenderness. Psychiatric: Patient has a normal mood and affect. behavior is normal. Judgment and thought  content normal. Skin: area of recent surgery in good aspect  PHQ2/9: Depression screen [facility] 2/9 08/08/2015 06/29/2015 05/13/2015 02/14/2015 11/15/2014  Decreased Interest 0 0 0 0 0  Down, Depressed, Hopeless 0 0 0 0 0  PHQ - 2 Score 0 0 0 0 0     Fall Risk: Fall Risk  08/08/2015 06/29/2015 05/13/2015 02/14/2015 11/15/2014  Falls in the past year? No No No No No     Functional Status Survey: Is the patient deaf or have difficulty hearing?: No Does the patient have difficulty seeing, even when wearing glasses/contacts?: No Does the patient have difficulty concentrating, remembering, or making decisions?: No Does the patient have difficulty walking or climbing stairs?: No Does the patient have difficulty dressing or bathing?: No Does the patient have difficulty doing errands alone such as visiting a doctor's office or shopping?: No    Assessment & Plan  1. Benign hypertension  Under control   2. ADD (attention deficit disorder)  - amphetamine-dextroamphetamine (ADDERALL) 20 MG tablet; Take 1 tablet (20 mg total) by mouth daily.  Dispense: 30 tablet; Refill: 0 - amphetamine-dextroamphetamine (ADDERALL) 10 MG tablet; Take 1 tablet (10 mg total) by mouth every evening.  Dispense: 30 tablet; Refill: 0 - amphetamine-dextroamphetamine (ADDERALL) 10 MG tablet; Take 1 tablet (10 mg total) by mouth daily at 2 PM.  Dispense: 30 tablet; Refill: 0 - amphetamine-dextroamphetamine (ADDERALL) 10 MG tablet; Take 1 tablet (10 mg total) by mouth daily at 2 PM.  Dispense: 30 tablet; Refill: 0 - amphetamine-dextroamphetamine (ADDERALL) 20 MG tablet; Take 1 tablet (20 mg total) by mouth daily.  Dispense: 30 tablet; Refill: 0 - amphetamine-dextroamphetamine (ADDERALL) 20 MG tablet; Take 1 tablet (20 mg total) by mouth daily.  Dispense: 30 tablet; Refill: 0  3. Dyslipidemia  Continue life style modification   4. Perennial allergic rhinitis with seasonal variation  Under control

## 2015-08-11 ENCOUNTER — Ambulatory Visit: Payer: 59 | Admitting: Family Medicine

## 2015-10-28 ENCOUNTER — Ambulatory Visit (INDEPENDENT_AMBULATORY_CARE_PROVIDER_SITE_OTHER): Payer: 59 | Admitting: Family Medicine

## 2015-10-28 ENCOUNTER — Encounter: Payer: Self-pay | Admitting: Family Medicine

## 2015-10-28 VITALS — BP 118/72 | HR 95 | Temp 98.1°F | Resp 16 | Wt 207.5 lb

## 2015-10-28 DIAGNOSIS — J309 Allergic rhinitis, unspecified: Secondary | ICD-10-CM | POA: Diagnosis not present

## 2015-10-28 DIAGNOSIS — F909 Attention-deficit hyperactivity disorder, unspecified type: Secondary | ICD-10-CM

## 2015-10-28 DIAGNOSIS — I10 Essential (primary) hypertension: Secondary | ICD-10-CM | POA: Diagnosis not present

## 2015-10-28 DIAGNOSIS — E785 Hyperlipidemia, unspecified: Secondary | ICD-10-CM | POA: Diagnosis not present

## 2015-10-28 DIAGNOSIS — L259 Unspecified contact dermatitis, unspecified cause: Secondary | ICD-10-CM | POA: Diagnosis not present

## 2015-10-28 DIAGNOSIS — J302 Other seasonal allergic rhinitis: Secondary | ICD-10-CM

## 2015-10-28 DIAGNOSIS — J3089 Other allergic rhinitis: Secondary | ICD-10-CM

## 2015-10-28 DIAGNOSIS — F988 Other specified behavioral and emotional disorders with onset usually occurring in childhood and adolescence: Secondary | ICD-10-CM

## 2015-10-28 MED ORDER — AMPHETAMINE-DEXTROAMPHETAMINE 10 MG PO TABS: 10.0000 mg | ORAL_TABLET | Freq: Every day | ORAL | Status: DC

## 2015-10-28 MED ORDER — AMPHETAMINE-DEXTROAMPHETAMINE 20 MG PO TABS: 20.0000 mg | ORAL_TABLET | Freq: Every day | ORAL | Status: DC

## 2015-10-28 MED ORDER — OLMESARTAN-AMLODIPINE-HCTZ 20-5-12.5 MG PO TABS: 1.0000 | ORAL_TABLET | Freq: Every day | ORAL | Status: DC

## 2015-10-28 MED ORDER — TRIAMCINOLONE ACETONIDE 0.1 % EX CREA: 1.0000 "application " | TOPICAL_CREAM | Freq: Two times a day (BID) | CUTANEOUS | Status: DC

## 2015-10-28 MED ORDER — AMPHETAMINE-DEXTROAMPHETAMINE 10 MG PO TABS: 10.0000 mg | ORAL_TABLET | Freq: Every evening | ORAL | Status: DC

## 2015-10-28 NOTE — Progress Notes (Signed)
Name: Victor Valdez   MRN: [id number]    DOB: November 17, 1965   Date:10/28/2015       Progress Note  Subjective  Chief Complaint  Chief Complaint  Patient presents with  . Medication Refill    patient is requesting a refill of the KEnolg 0.1%  . Hypertension  . ADD  . Dyslipidemia  . Allergic Rhinitis     HPI  HTN: patient is taking medication as prescribed and bp has been at goal, no chest pain, no SOB or palpitation, she denies dizziness  ADD: patient is doing well on medication, he denies side effects of medication, able to focus at work on medication. He takes second dose in the afternoon and is able to stay focused until 6 pm. No problems following asleep at night. No tachycardia, bp has been at goal  Basal cell Carcinoma of face: just had it removed from right lower eye lid back in April,  doing well  AR: she is not having any symptoms at this time, no nasal congestion, sneezing or rhinorrhea  Contact Dermatitis: he has yearly poison ivy and uses medication prn when needed.    Patient Active Problem List   Diagnosis Date Noted  . ADD (attention deficit disorder) 11/13/2014  . Basal cell carcinoma of face 11/13/2014  . CD (contact dermatitis) 11/13/2014  . Dyslipidemia 11/13/2014  . Blood glucose elevated 11/13/2014  . Benign hypertension 11/13/2014  . Perennial allergic rhinitis with seasonal variation 11/13/2014  . Allergic rhinitis 11/13/2014    Past Surgical History  Procedure Laterality Date  . Lasik Bilateral 12.30.2015    Family History  Problem Relation Age of Onset  . Hypertension Mother   . Cancer Father     Prostate    Social History   Social History  . Marital Status: Married    Spouse Name: N/A  . Number of Children: N/A  . Years of Education: N/A   Occupational History  . Not on file.   Social History Main Topics  . Smoking status: Never Smoker   . Smokeless tobacco: Never Used  . Alcohol Use: 0.0 oz/week    0 Standard drinks or  equivalent per week  . Drug Use: No  . Sexual Activity: Yes   Other Topics Concern  . Not on file   Social History Narrative     Current outpatient prescriptions:  .  amphetamine-dextroamphetamine (ADDERALL) 10 MG tablet, Take 1 tablet (10 mg total) by mouth every evening., Disp: 30 tablet, Rfl: 0 .  amphetamine-dextroamphetamine (ADDERALL) 10 MG tablet, Take 1 tablet (10 mg total) by mouth daily at 2 PM., Disp: 30 tablet, Rfl: 0 .  amphetamine-dextroamphetamine (ADDERALL) 10 MG tablet, Take 1 tablet (10 mg total) by mouth daily at 2 PM., Disp: 30 tablet, Rfl: 0 .  amphetamine-dextroamphetamine (ADDERALL) 20 MG tablet, Take 1 tablet (20 mg total) by mouth daily., Disp: 30 tablet, Rfl: 0 .  amphetamine-dextroamphetamine (ADDERALL) 20 MG tablet, Take 1 tablet (20 mg total) by mouth daily., Disp: 30 tablet, Rfl: 0 .  amphetamine-dextroamphetamine (ADDERALL) 20 MG tablet, Take 1 tablet (20 mg total) by mouth daily., Disp: 30 tablet, Rfl: 0 .  cetirizine (ZYRTEC) 10 MG tablet, Take 1 tablet by mouth daily., Disp: , Rfl:  .  mometasone (NASONEX) 50 MCG/ACT nasal spray, Place 2 sprays into the nose at bedtime., Disp: , Rfl:  .  Olmesartan-Amlodipine-HCTZ (TRIBENZOR) 20-5-12.5 MG TABS, Take 1 tablet by mouth daily., Disp: 90 tablet, Rfl: 4  Allergies  Allergen Reactions  .  Aspirin   . Penicillins      ROS  Ten systems reviewed and is negative except as mentioned in HPI   Objective  Filed Vitals:   10/28/15 1119  BP: 118/72  Pulse: 95  Temp: 98.1 F (36.7 C)  TempSrc: Oral  Resp: 16  Weight: 207 lb 8 oz (94.121 kg)  SpO2: 97%    Body mass index is 29.77 kg/(m^2).  Physical Exam  Constitutional: Patient appears well-developed and well-nourished. Obese  No distress.  HEENT: head atraumatic, normocephalic, pupils equal and reactive to light, neck supple, throat within normal limits Cardiovascular: Normal rate, regular rhythm and normal heart sounds.  No murmur heard. No BLE  edema. Pulmonary/Chest: Effort normal and breath sounds normal. No respiratory distress. Abdominal: Soft.  There is no tenderness. Psychiatric: Patient has a normal mood and affect. behavior is normal. Judgment and thought content normal.  PHQ2/9: Depression screen [facility] 2/9 10/28/2015 08/08/2015 06/29/2015 05/13/2015 02/14/2015  Decreased Interest 0 0 0 0 0  Down, Depressed, Hopeless 0 0 0 0 0  PHQ - 2 Score 0 0 0 0 0    Fall Risk: Fall Risk  10/28/2015 08/08/2015 06/29/2015 05/13/2015 02/14/2015  Falls in the past year? No No No No No    Functional Status Survey: Is the patient deaf or have difficulty hearing?: No Does the patient have difficulty seeing, even when wearing glasses/contacts?: No Does the patient have difficulty concentrating, remembering, or making decisions?: No Does the patient have difficulty walking or climbing stairs?: No Does the patient have difficulty dressing or bathing?: No Does the patient have difficulty doing errands alone such as visiting a doctor's office or shopping?: No   Assessment & Plan  1. Benign hypertension  - Olmesartan-Amlodipine-HCTZ (TRIBENZOR) 20-5-12.5 MG TABS; Take 1 tablet by mouth daily.  Dispense: 90 tablet; Refill: 4  2. ADD (attention deficit disorder)  - amphetamine-dextroamphetamine (ADDERALL) 20 MG tablet; Take 1 tablet (20 mg total) by mouth daily.  Dispense: 30 tablet; Refill: 0 - amphetamine-dextroamphetamine (ADDERALL) 10 MG tablet; Take 1 tablet (10 mg total) by mouth every evening.  Dispense: 30 tablet; Refill: 0 - amphetamine-dextroamphetamine (ADDERALL) 10 MG tablet; Take 1 tablet (10 mg total) by mouth daily at 2 PM.  Dispense: 30 tablet; Refill: 0 - amphetamine-dextroamphetamine (ADDERALL) 10 MG tablet; Take 1 tablet (10 mg total) by mouth daily at 2 PM.  Dispense: 30 tablet; Refill: 0 - amphetamine-dextroamphetamine (ADDERALL) 20 MG tablet; Take 1 tablet (20 mg total) by mouth daily.  Dispense: 30 tablet; Refill: 0 -  amphetamine-dextroamphetamine (ADDERALL) 20 MG tablet; Take 1 tablet (20 mg total) by mouth daily.  Dispense: 30 tablet; Refill: 0  3. Dyslipidemia  Continue medication  4. Perennial allergic rhinitis with seasonal variation  Doing well  5. Contact dermatitis  - triamcinolone cream (KENALOG) 0.1 %; Apply 1 application topically 2 (two) times daily.  Dispense: 453.6 g; Refill: 0

## 2015-11-10 ENCOUNTER — Other Ambulatory Visit: Payer: Self-pay | Admitting: Family Medicine

## 2015-11-10 NOTE — Telephone Encounter (Signed)
Patient requesting refill. 

## 2016-01-05 LAB — BASIC METABOLIC PANEL
Creatinine: 1.5 mg/dL — AB (ref 0.6–1.3)
Glucose: 108 mg/dL

## 2016-01-05 LAB — LIPID PANEL
Cholesterol: 264 mg/dL — AB (ref 0–200)
HDL: 82 mg/dL — AB (ref 35–70)
LDL Cholesterol: 161 mg/dL
Triglycerides: 107 mg/dL (ref 40–160)

## 2016-01-05 LAB — HEMOGLOBIN A1C: Hemoglobin A1C: 5.3

## 2016-01-31 ENCOUNTER — Encounter: Payer: Self-pay | Admitting: Family Medicine

## 2016-01-31 ENCOUNTER — Ambulatory Visit (INDEPENDENT_AMBULATORY_CARE_PROVIDER_SITE_OTHER): Payer: 59 | Admitting: Family Medicine

## 2016-01-31 VITALS — BP 118/79 | HR 90 | Temp 97.7°F | Resp 16 | Ht 70.0 in | Wt 206.9 lb

## 2016-01-31 DIAGNOSIS — E785 Hyperlipidemia, unspecified: Secondary | ICD-10-CM

## 2016-01-31 DIAGNOSIS — J309 Allergic rhinitis, unspecified: Secondary | ICD-10-CM

## 2016-01-31 DIAGNOSIS — Z23 Encounter for immunization: Secondary | ICD-10-CM | POA: Diagnosis not present

## 2016-01-31 DIAGNOSIS — F909 Attention-deficit hyperactivity disorder, unspecified type: Secondary | ICD-10-CM | POA: Diagnosis not present

## 2016-01-31 DIAGNOSIS — Z1211 Encounter for screening for malignant neoplasm of colon: Secondary | ICD-10-CM

## 2016-01-31 DIAGNOSIS — J3089 Other allergic rhinitis: Secondary | ICD-10-CM

## 2016-01-31 DIAGNOSIS — F988 Other specified behavioral and emotional disorders with onset usually occurring in childhood and adolescence: Secondary | ICD-10-CM

## 2016-01-31 DIAGNOSIS — I10 Essential (primary) hypertension: Secondary | ICD-10-CM

## 2016-01-31 DIAGNOSIS — J302 Other seasonal allergic rhinitis: Secondary | ICD-10-CM

## 2016-01-31 MED ORDER — AMPHETAMINE-DEXTROAMPHETAMINE 20 MG PO TABS: 20.0000 mg | ORAL_TABLET | Freq: Every day | ORAL | 0 refills | Status: DC

## 2016-01-31 MED ORDER — AMPHETAMINE-DEXTROAMPHETAMINE 10 MG PO TABS: 10.0000 mg | ORAL_TABLET | Freq: Every day | ORAL | 0 refills | Status: DC

## 2016-01-31 MED ORDER — AMPHETAMINE-DEXTROAMPHETAMINE 10 MG PO TABS: 10.0000 mg | ORAL_TABLET | Freq: Every evening | ORAL | 0 refills | Status: DC

## 2016-01-31 NOTE — Progress Notes (Signed)
Name: Victor Valdez   MRN: [id number]    DOB: [birth year]   Date:01/31/2016       Progress Note  Subjective  Chief Complaint  Chief Complaint  Patient presents with  . Follow-up    3 mo  . Medication Refill    adderall    HPI  HTN: patient is taking medication as prescribed and bp has been at goal, no chest pain, no SOB or palpitation, he denies orthostatic changes  ADD: patient is doing well on medication, he denies side effects of medication, able to focus at work on medication. He takes second dose in the afternoon and is able to stay focused until 6 pm. No problems following asleep at night. No tachycardia, bp has been at goal   AR: she is not having any symptoms at this time, no nasal congestion, sneezing or rhinorrhea  Dyslipidemia: he will fax me the results of his labs today    Patient Active Problem List   Diagnosis Date Noted  . ADD (attention deficit disorder) 11/13/2014  . Basal cell carcinoma of face 11/13/2014  . CD (contact dermatitis) 11/13/2014  . Dyslipidemia 11/13/2014  . Blood glucose elevated 11/13/2014  . Benign hypertension 11/13/2014  . Perennial allergic rhinitis with seasonal variation 11/13/2014  . Allergic rhinitis 11/13/2014    Past Surgical History:  Procedure Laterality Date  . LASIK Bilateral 12.30.2015    Family History  Problem Relation Age of Onset  . Hypertension Mother   . Cancer Father     Prostate    Social History   Social History  . Marital status: Married    Spouse name: N/A  . Number of children: N/A  . Years of education: N/A   Occupational History  . Not on file.   Social History Main Topics  . Smoking status: Never Smoker  . Smokeless tobacco: Never Used  . Alcohol use 0.0 oz/week  . Drug use: No  . Sexual activity: Yes   Other Topics Concern  . Not on file   Social History Narrative  . No narrative on file     Current Outpatient Prescriptions:  .  amphetamine-dextroamphetamine (ADDERALL) 10  MG tablet, Take 1 tablet (10 mg total) by mouth daily at 2 PM., Disp: 30 tablet, Rfl: 0 .  amphetamine-dextroamphetamine (ADDERALL) 10 MG tablet, Take 1 tablet (10 mg total) by mouth every evening., Disp: 30 tablet, Rfl: 0 .  amphetamine-dextroamphetamine (ADDERALL) 10 MG tablet, Take 1 tablet (10 mg total) by mouth daily at 2 PM., Disp: 30 tablet, Rfl: 0 .  amphetamine-dextroamphetamine (ADDERALL) 20 MG tablet, Take 1 tablet (20 mg total) by mouth daily., Disp: 30 tablet, Rfl: 0 .  amphetamine-dextroamphetamine (ADDERALL) 20 MG tablet, Take 1 tablet (20 mg total) by mouth daily., Disp: 30 tablet, Rfl: 0 .  amphetamine-dextroamphetamine (ADDERALL) 20 MG tablet, Take 1 tablet (20 mg total) by mouth daily., Disp: 30 tablet, Rfl: 0 .  cetirizine (ZYRTEC) 10 MG tablet, Take 1 tablet by mouth daily., Disp: , Rfl:  .  mometasone (NASONEX) 50 MCG/ACT nasal spray, Place 2 sprays into the nose at bedtime., Disp: , Rfl:  .  Olmesartan-Amlodipine-HCTZ (TRIBENZOR) 20-5-12.5 MG TABS, Take 1 tablet by mouth daily., Disp: 90 tablet, Rfl: 4 .  triamcinolone cream (KENALOG) 0.1 %, APPLY TWICE A DAY AS DIRECTED, Disp: 454 g, Rfl: 0  Allergies  Allergen Reactions  . Aspirin   . Penicillins      ROS  Constitutional: Negative for fever or  weight change.  Respiratory: Negative for cough and shortness of breath.   Cardiovascular: Negative for chest pain or palpitations.  Gastrointestinal: Negative for abdominal pain, no bowel changes.  Musculoskeletal: Negative for gait problem or joint swelling.  Skin: Negative for rash.  Neurological: Negative for dizziness or headache.  No other specific complaints in a complete review of systems (except as listed in HPI above).  Objective  Vitals:   01/31/16 0754  BP: 118/79  Pulse: 90  Resp: 16  Temp: 97.7 F (36.5 C)  TempSrc: Oral  SpO2: 96%  Weight: 206 lb 14.4 oz (93.8 kg)  Height: 5\' 10"  (1.778 m)    Body mass index is 29.69 kg/m.  Physical  Exam  Constitutional: Patient appears well-developed and well-nourished. Obese  No distress.  HEENT: head atraumatic, normocephalic, pupils equal and reactive to light, neck supple, throat within normal limits Cardiovascular: Normal rate, regular rhythm and normal heart sounds.  No murmur heard. No BLE edema. Pulmonary/Chest: Effort normal and breath sounds normal. No respiratory distress. Abdominal: Soft.  There is no tenderness. Psychiatric: Patient has a normal mood and affect. behavior is normal. Judgment and thought content normal.  PHQ2/9: Depression screen [facility] 2/9 01/31/2016 10/28/2015 08/08/2015 06/29/2015 05/13/2015  Decreased Interest 0 0 0 0 0  Down, Depressed, Hopeless 0 0 0 0 0  PHQ - 2 Score 0 0 0 0 0     Fall Risk: Fall Risk  01/31/2016 10/28/2015 08/08/2015 06/29/2015 05/13/2015  Falls in the past year? No No No No No     Functional Status Survey: Is the patient deaf or have difficulty hearing?: No Does the patient have difficulty seeing, even when wearing glasses/contacts?: No Does the patient have difficulty concentrating, remembering, or making decisions?: No Does the patient have difficulty walking or climbing stairs?: No Does the patient have difficulty dressing or bathing?: No Does the patient have difficulty doing errands alone such as visiting a doctor's office or shopping?: No    Assessment & Plan  1. Benign hypertension  Well controlled, continue medication   2. ADD (attention deficit disorder)  - amphetamine-dextroamphetamine (ADDERALL) 10 MG tablet; Take 1 tablet (10 mg total) by mouth daily at 2 PM.  Dispense: 30 tablet; Refill: 0 - amphetamine-dextroamphetamine (ADDERALL) 10 MG tablet; Take 1 tablet (10 mg total) by mouth every evening.  Dispense: 30 tablet; Refill: 0 - amphetamine-dextroamphetamine (ADDERALL) 10 MG tablet; Take 1 tablet (10 mg total) by mouth daily at 2 PM.  Dispense: 30 tablet; Refill: 0 - amphetamine-dextroamphetamine (ADDERALL) 20 MG  tablet; Take 1 tablet (20 mg total) by mouth daily.  Dispense: 30 tablet; Refill: 0 - amphetamine-dextroamphetamine (ADDERALL) 20 MG tablet; Take 1 tablet (20 mg total) by mouth daily.  Dispense: 30 tablet; Refill: 0 - amphetamine-dextroamphetamine (ADDERALL) 20 MG tablet; Take 1 tablet (20 mg total) by mouth daily.  Dispense: 30 tablet; Refill: 0  3. Dyslipidemia  Not on statin therapy   4. Perennial allergic rhinitis with seasonal variation  Well controlled at this time   5. Needs flu shot  - Flu Vaccine QUAD 36+ mos IM   6. Colon cancer screening  - Ambulatory referral to Gastroenterology

## 2016-04-27 ENCOUNTER — Encounter: Payer: Self-pay | Admitting: Family Medicine

## 2016-04-27 ENCOUNTER — Ambulatory Visit (INDEPENDENT_AMBULATORY_CARE_PROVIDER_SITE_OTHER): Payer: 59 | Admitting: Family Medicine

## 2016-04-27 VITALS — BP 112/62 | HR 89 | Temp 97.7°F | Resp 18 | Ht 70.0 in | Wt 207.4 lb

## 2016-04-27 DIAGNOSIS — E785 Hyperlipidemia, unspecified: Secondary | ICD-10-CM | POA: Diagnosis not present

## 2016-04-27 DIAGNOSIS — F901 Attention-deficit hyperactivity disorder, predominantly hyperactive type: Secondary | ICD-10-CM

## 2016-04-27 DIAGNOSIS — I10 Essential (primary) hypertension: Secondary | ICD-10-CM

## 2016-04-27 MED ORDER — AMPHETAMINE-DEXTROAMPHETAMINE 20 MG PO TABS: 20.0000 mg | ORAL_TABLET | Freq: Every day | ORAL | 0 refills | Status: DC

## 2016-04-27 MED ORDER — AMPHETAMINE-DEXTROAMPHETAMINE 10 MG PO TABS: 10.0000 mg | ORAL_TABLET | Freq: Every day | ORAL | 0 refills | Status: DC

## 2016-04-27 MED ORDER — AMPHETAMINE-DEXTROAMPHETAMINE 10 MG PO TABS: 10.0000 mg | ORAL_TABLET | Freq: Every evening | ORAL | 0 refills | Status: DC

## 2016-04-27 NOTE — Progress Notes (Signed)
Name: Victor Valdez   MRN: [id number]    DOB: 12-16-1965   Date:04/27/2016       Progress Note  Subjective  Chief Complaint  Chief Complaint  Patient presents with  . Hypertension  . ADHD  . Allergic Rhinitis     HPI  HTN: patient is taking medication as prescribed and bp has been at goal, no chest pain, no SOB or palpitation, he denies orthostatic changes  ADD: patient is doing well on medication, he denies side effects of medication, able to focus at work on medication. He takes second dose in the afternoon and is able to stay focused until 6 pm. No problems following asleep at night. No tachycardia, bp has been at goal. He denies lack of appetite  AR: he states that he has no nasal congestion, sneezing or rhinorrhea  Dyslipidemia: he forgot to fax me the lab results.   Patient Active Problem List   Diagnosis Date Noted  . ADD (attention deficit disorder) 11/13/2014  . Basal cell carcinoma of face 11/13/2014  . CD (contact dermatitis) 11/13/2014  . Dyslipidemia 11/13/2014  . Blood glucose elevated 11/13/2014  . Benign hypertension 11/13/2014  . Perennial allergic rhinitis with seasonal variation 11/13/2014  . Allergic rhinitis 11/13/2014    Past Surgical History:  Procedure Laterality Date  . LASIK Bilateral 12.30.2015    Family History  Problem Relation Age of Onset  . Hypertension Mother   . Cancer Father 59    prostate cancer  . Hypertension Sister   . Obesity Sister     Social History   Social History  . Marital status: Married    Spouse name: N/A  . Number of children: N/A  . Years of education: N/A   Occupational History  . Not on file.   Social History Main Topics  . Smoking status: Never Smoker  . Smokeless tobacco: Never Used  . Alcohol use 0.0 oz/week  . Drug use: No  . Sexual activity: Yes   Other Topics Concern  . Not on file   Social History Narrative  . No narrative on file     Current Outpatient Prescriptions:  .   amphetamine-dextroamphetamine (ADDERALL) 10 MG tablet, Take 1 tablet (10 mg total) by mouth daily at 2 PM., Disp: 30 tablet, Rfl: 0 .  amphetamine-dextroamphetamine (ADDERALL) 10 MG tablet, Take 1 tablet (10 mg total) by mouth every evening., Disp: 30 tablet, Rfl: 0 .  amphetamine-dextroamphetamine (ADDERALL) 10 MG tablet, Take 1 tablet (10 mg total) by mouth daily at 2 PM., Disp: 30 tablet, Rfl: 0 .  amphetamine-dextroamphetamine (ADDERALL) 20 MG tablet, Take 1 tablet (20 mg total) by mouth daily., Disp: 30 tablet, Rfl: 0 .  amphetamine-dextroamphetamine (ADDERALL) 20 MG tablet, Take 1 tablet (20 mg total) by mouth daily., Disp: 30 tablet, Rfl: 0 .  amphetamine-dextroamphetamine (ADDERALL) 20 MG tablet, Take 1 tablet (20 mg total) by mouth daily., Disp: 30 tablet, Rfl: 0 .  cetirizine (ZYRTEC) 10 MG tablet, Take 1 tablet by mouth daily., Disp: , Rfl:  .  mometasone (NASONEX) 50 MCG/ACT nasal spray, Place 2 sprays into the nose at bedtime., Disp: , Rfl:  .  Olmesartan-Amlodipine-HCTZ (TRIBENZOR) 20-5-12.5 MG TABS, Take 1 tablet by mouth daily., Disp: 90 tablet, Rfl: 4 .  triamcinolone cream (KENALOG) 0.1 %, APPLY TWICE A DAY AS DIRECTED, Disp: 454 g, Rfl: 0  Allergies  Allergen Reactions  . Aspirin   . Penicillins      ROS  Constitutional: Negative for fever or  weight change.  Respiratory: Negative for cough and shortness of breath.   Cardiovascular: Negative for chest pain or palpitations.  Gastrointestinal: Negative for abdominal pain, no bowel changes.  Musculoskeletal: Negative for gait problem or joint swelling.  Skin: Negative for rash.  Neurological: Negative for dizziness or headache.  No other specific complaints in a complete review of systems (except as listed in HPI above).  Objective  Vitals:   04/27/16 0729  BP: 112/62  Pulse: 89  Resp: 18  Temp: 97.7 F (36.5 C)  TempSrc: Oral  SpO2: 96%  Weight: 207 lb 7 oz (94.1 kg)  Height: 5\' 10"  (1.778 m)    Body mass  index is 29.76 kg/m.  Physical Exam  Constitutional: Patient appears well-developed and well-nourished. Obese No distress.  HEENT: head atraumatic, normocephalic, pupils equal and reactive to light,  neck supple, throat within normal limits Cardiovascular: Normal rate, regular rhythm and normal heart sounds.  No murmur heard. No BLE edema. Pulmonary/Chest: Effort normal and breath sounds normal. No respiratory distress. Abdominal: Soft.  There is no tenderness. Psychiatric: Patient has a normal mood and affect. behavior is normal. Judgment and thought content normal.   PHQ2/9: Depression screen [facility] 2/9 01/31/2016 10/28/2015 08/08/2015 06/29/2015 05/13/2015  Decreased Interest 0 0 0 0 0  Down, Depressed, Hopeless 0 0 0 0 0  PHQ - 2 Score 0 0 0 0 0    Fall Risk: Fall Risk  01/31/2016 10/28/2015 08/08/2015 06/29/2015 05/13/2015  Falls in the past year? No No No No No    Assessment & Plan  1. Benign hypertension  Well controlled  2. Attention deficit hyperactivity disorder (ADHD), predominantly hyperactive type  - amphetamine-dextroamphetamine (ADDERALL) 20 MG tablet; Take 1 tablet (20 mg total) by mouth daily.  Dispense: 30 tablet; Refill: 0 - amphetamine-dextroamphetamine (ADDERALL) 10 MG tablet; Take 1 tablet (10 mg total) by mouth daily at 2 PM.  Dispense: 30 tablet; Refill: 0 - amphetamine-dextroamphetamine (ADDERALL) 10 MG tablet; Take 1 tablet (10 mg total) by mouth every evening.  Dispense: 30 tablet; Refill: 0 - amphetamine-dextroamphetamine (ADDERALL) 10 MG tablet; Take 1 tablet (10 mg total) by mouth daily at 2 PM.  Dispense: 30 tablet; Refill: 0 - amphetamine-dextroamphetamine (ADDERALL) 20 MG tablet; Take 1 tablet (20 mg total) by mouth daily.  Dispense: 30 tablet; Refill: 0 - amphetamine-dextroamphetamine (ADDERALL) 20 MG tablet; Take 1 tablet (20 mg total) by mouth daily.  Dispense: 30 tablet; Refill: 0  3. Dyslipidemia

## 2016-07-10 ENCOUNTER — Ambulatory Visit: Admission: RE | Admit: 2016-07-10 | Payer: 59 | Source: Ambulatory Visit | Admitting: Gastroenterology

## 2016-07-10 ENCOUNTER — Encounter: Admission: RE | Payer: Self-pay | Source: Ambulatory Visit

## 2016-07-10 SURGERY — COLONOSCOPY WITH PROPOFOL
Anesthesia: General

## 2016-07-26 ENCOUNTER — Ambulatory Visit (INDEPENDENT_AMBULATORY_CARE_PROVIDER_SITE_OTHER): Payer: 59 | Admitting: Family Medicine

## 2016-07-26 ENCOUNTER — Encounter: Payer: Self-pay | Admitting: Family Medicine

## 2016-07-26 VITALS — BP 142/100 | HR 90 | Temp 97.5°F | Resp 16 | Ht 70.0 in | Wt 206.4 lb

## 2016-07-26 DIAGNOSIS — J3089 Other allergic rhinitis: Secondary | ICD-10-CM | POA: Diagnosis not present

## 2016-07-26 DIAGNOSIS — Z1211 Encounter for screening for malignant neoplasm of colon: Secondary | ICD-10-CM

## 2016-07-26 DIAGNOSIS — F901 Attention-deficit hyperactivity disorder, predominantly hyperactive type: Secondary | ICD-10-CM | POA: Diagnosis not present

## 2016-07-26 DIAGNOSIS — E663 Overweight: Secondary | ICD-10-CM | POA: Diagnosis not present

## 2016-07-26 DIAGNOSIS — I10 Essential (primary) hypertension: Secondary | ICD-10-CM | POA: Diagnosis not present

## 2016-07-26 DIAGNOSIS — J302 Other seasonal allergic rhinitis: Secondary | ICD-10-CM | POA: Diagnosis not present

## 2016-07-26 DIAGNOSIS — E785 Hyperlipidemia, unspecified: Secondary | ICD-10-CM

## 2016-07-26 MED ORDER — ATENOLOL 25 MG PO TABS: 25.0000 mg | ORAL_TABLET | Freq: Every evening | ORAL | 0 refills | Status: DC

## 2016-07-26 MED ORDER — AMPHETAMINE-DEXTROAMPHETAMINE 10 MG PO TABS: 10.0000 mg | ORAL_TABLET | Freq: Every day | ORAL | 0 refills | Status: DC

## 2016-07-26 MED ORDER — AMPHETAMINE-DEXTROAMPHETAMINE 20 MG PO TABS: 20.0000 mg | ORAL_TABLET | Freq: Every day | ORAL | 0 refills | Status: DC

## 2016-07-26 MED ORDER — AMPHETAMINE-DEXTROAMPHETAMINE 10 MG PO TABS: 10.0000 mg | ORAL_TABLET | Freq: Every evening | ORAL | 0 refills | Status: DC

## 2016-07-26 NOTE — Progress Notes (Signed)
Name: Victor Valdez   MRN: [id number]    DOB: Feb 05, 1966   Date:07/26/2016       Progress Note  Subjective  Chief Complaint  Chief Complaint  Patient presents with  . Hypertension    3 month follow up  . ADD  . Hyperlipidemia    HPI   HTN:  no chest pain, no SOB or palpitation, he denies orthostatic changes. He has not been taking medication lately and is very worried about his 50 yo daughter that they just found out has been self - cutting   ADD: patient is doing well on medication, he denies side effects of medication, able to focus at work on medication. He takes second dose in the afternoon and is able to stay focused until 6 pm. No problems following asleep at night. No tachycardia, bp has been at goal. He denies lack of appetite He changed departments but is doing okay.  AR: he states that he has no nasal congestion, sneezing or rhinorrhea  Dyslipidemia: we recheck labs   Patient Active Problem List   Diagnosis Date Noted  . ADD (attention deficit disorder) 11/13/2014  . Basal cell carcinoma of face 11/13/2014  . CD (contact dermatitis) 11/13/2014  . Dyslipidemia 11/13/2014  . Blood glucose elevated 11/13/2014  . Benign hypertension 11/13/2014  . Perennial allergic rhinitis with seasonal variation 11/13/2014  . Allergic rhinitis 11/13/2014    Past Surgical History:  Procedure Laterality Date  . LASIK Bilateral 12.30.2015    Family History  Problem Relation Age of Onset  . Hypertension Mother   . Cancer Father 5    prostate cancer  . Hypertension Sister   . Obesity Sister     Social History   Social History  . Marital status: Married    Spouse name: N/A  . Number of children: N/A  . Years of education: N/A   Occupational History  . Not on file.   Social History Main Topics  . Smoking status: Never Smoker  . Smokeless tobacco: Never Used  . Alcohol use 0.0 oz/week  . Drug use: No  . Sexual activity: Yes   Other Topics Concern  . Not on  file   Social History Narrative  . No narrative on file     Current Outpatient Prescriptions:  .  amphetamine-dextroamphetamine (ADDERALL) 10 MG tablet, Take 1 tablet (10 mg total) by mouth daily at 2 PM., Disp: 30 tablet, Rfl: 0 .  amphetamine-dextroamphetamine (ADDERALL) 10 MG tablet, Take 1 tablet (10 mg total) by mouth every evening., Disp: 30 tablet, Rfl: 0 .  amphetamine-dextroamphetamine (ADDERALL) 10 MG tablet, Take 1 tablet (10 mg total) by mouth daily at 2 PM., Disp: 30 tablet, Rfl: 0 .  amphetamine-dextroamphetamine (ADDERALL) 20 MG tablet, Take 1 tablet (20 mg total) by mouth daily., Disp: 30 tablet, Rfl: 0 .  amphetamine-dextroamphetamine (ADDERALL) 20 MG tablet, Take 1 tablet (20 mg total) by mouth daily., Disp: 30 tablet, Rfl: 0 .  amphetamine-dextroamphetamine (ADDERALL) 20 MG tablet, Take 1 tablet (20 mg total) by mouth daily., Disp: 30 tablet, Rfl: 0 .  cetirizine (ZYRTEC) 10 MG tablet, Take 1 tablet by mouth daily., Disp: , Rfl:  .  mometasone (NASONEX) 50 MCG/ACT nasal spray, Place 2 sprays into the nose at bedtime., Disp: , Rfl:  .  Olmesartan-Amlodipine-HCTZ (TRIBENZOR) 20-5-12.5 MG TABS, Take 1 tablet by mouth daily., Disp: 90 tablet, Rfl: 4 .  triamcinolone cream (KENALOG) 0.1 %, APPLY TWICE A DAY AS DIRECTED, Disp: 454 g, Rfl: 0  Allergies  Allergen Reactions  . Aspirin   . Penicillins      ROS  Constitutional: Negative for fever or weight change.  Respiratory: Negative for cough and shortness of breath.   Cardiovascular: Negative for chest pain or palpitations.  Gastrointestinal: Negative for abdominal pain, no bowel changes.  Musculoskeletal: Negative for gait problem or joint swelling.  Skin: Negative for rash.  Neurological: Negative for dizziness or headache.  No other specific complaints in a complete review of systems (except as listed in HPI above).  Objective  Vitals:   07/26/16 0832 07/26/16 0847  BP: (!) 142/98 (!) 142/100  Pulse: 90    Resp: 16   Temp: 97.5 F (36.4 C)   SpO2: 95%   Weight: 206 lb 6 oz (93.6 kg)   Height: 5\' 10"  (1.778 m)     Body mass index is 29.61 kg/m.  Physical Exam  Constitutional: Patient appears well-developed and well-nourished. Obese  No distress.  HEENT: head atraumatic, normocephalic, pupils equal and reactive to light,  neck supple, throat within normal limits Cardiovascular: Normal rate, regular rhythm and normal heart sounds.  No murmur heard. No BLE edema. Pulmonary/Chest: Effort normal and breath sounds normal. No respiratory distress. Abdominal: Soft.  There is no tenderness. Psychiatric: Patient has a normal mood and affect. behavior is normal. Judgment and thought content normal.  PHQ2/9: Depression screen [facility] 2/9 07/26/2016 01/31/2016 10/28/2015 08/08/2015 06/29/2015  Decreased Interest 0 0 0 0 0  Down, Depressed, Hopeless 0 0 0 0 0  PHQ - 2 Score 0 0 0 0 0     Fall Risk: Fall Risk  07/26/2016 01/31/2016 10/28/2015 08/08/2015 06/29/2015  Falls in the past year? No No No No No     Assessment & Plan  1. Benign hypertension  - CBC with Differential/Platelet - Comp. Metabolic Panel (12) - CBC With Differential We will add Atenolol to help with stress and bp, recheck in one month  2. Attention deficit hyperactivity disorder (ADHD), predominantly hyperactive type  - amphetamine-dextroamphetamine (ADDERALL) 10 MG tablet; Take 1 tablet (10 mg total) by mouth daily at 2 PM.  Dispense: 30 tablet; Refill: 0 - amphetamine-dextroamphetamine (ADDERALL) 10 MG tablet; Take 1 tablet (10 mg total) by mouth every evening.  Dispense: 30 tablet; Refill: 0 - amphetamine-dextroamphetamine (ADDERALL) 10 MG tablet; Take 1 tablet (10 mg total) by mouth daily at 2 PM.  Dispense: 30 tablet; Refill: 0 - amphetamine-dextroamphetamine (ADDERALL) 20 MG tablet; Take 1 tablet (20 mg total) by mouth daily.  Dispense: 30 tablet; Refill: 0 - amphetamine-dextroamphetamine (ADDERALL) 20 MG tablet; Take 1  tablet (20 mg total) by mouth daily.  Dispense: 30 tablet; Refill: 0 - amphetamine-dextroamphetamine (ADDERALL) 20 MG tablet; Take 1 tablet (20 mg total) by mouth daily.  Dispense: 30 tablet; Refill: 0  3. Dyslipidemia  - Lipid panel  4. Perennial allergic rhinitis with seasonal variation  Continue otc medication   5. Colon cancer screening  - Ambulatory referral to Gastroenterology  6. Overweight (BMI 25.0-29.9)  - Hemoglobin A1c - Insulin, 2 Hour

## 2016-07-26 NOTE — Addendum Note (Signed)
Addended by: Lolita Rieger D on: 07/26/2016 09:37 AM   Modules accepted: Orders

## 2016-08-30 ENCOUNTER — Other Ambulatory Visit: Payer: Self-pay | Admitting: Family Medicine

## 2016-08-30 ENCOUNTER — Encounter: Payer: Self-pay | Admitting: Family Medicine

## 2016-08-30 ENCOUNTER — Ambulatory Visit (INDEPENDENT_AMBULATORY_CARE_PROVIDER_SITE_OTHER): Payer: 59 | Admitting: Family Medicine

## 2016-08-30 VITALS — BP 120/68 | HR 86 | Temp 97.7°F | Resp 16 | Ht 70.0 in | Wt 204.1 lb

## 2016-08-30 DIAGNOSIS — E785 Hyperlipidemia, unspecified: Secondary | ICD-10-CM | POA: Diagnosis not present

## 2016-08-30 DIAGNOSIS — I10 Essential (primary) hypertension: Secondary | ICD-10-CM

## 2016-08-30 MED ORDER — ATENOLOL 25 MG PO TABS: 25.0000 mg | ORAL_TABLET | Freq: Every evening | ORAL | 1 refills | Status: DC

## 2016-08-30 NOTE — Patient Instructions (Addendum)
The conscious parent - book -

## 2016-08-30 NOTE — Progress Notes (Signed)
Name: Victor Valdez   MRN: [id number]    DOB: 1965-07-22   Date:08/30/2016       Progress Note  Subjective  Chief Complaint  Chief Complaint  Patient presents with  . Hypertension    1 month follow up    HPI  HTN: he is now on Atenolol qhs and Tribenzor and bp is at goal, he denies side effects of medication. No chest pain or palpitation. Stress at home is down, daughter is seeing therapist.   Dyslipidemia; reviewed labs done at work, his LDL was 161, but HDL was 83, ASCVD is 3.2%, discussed life style modification, we will hold off on statin therapy   Patient Active Problem List   Diagnosis Date Noted  . ADD (attention deficit disorder) 11/13/2014  . Basal cell carcinoma of face 11/13/2014  . CD (contact dermatitis) 11/13/2014  . Dyslipidemia 11/13/2014  . Blood glucose elevated 11/13/2014  . Benign hypertension 11/13/2014  . Perennial allergic rhinitis with seasonal variation 11/13/2014  . Allergic rhinitis 11/13/2014    Past Surgical History:  Procedure Laterality Date  . LASIK Bilateral 12.30.2015    Family History  Problem Relation Age of Onset  . Hypertension Mother   . Cancer Father 65    prostate cancer  . Hypertension Sister   . Obesity Sister     Social History   Social History  . Marital status: Married    Spouse name: N/A  . Number of children: N/A  . Years of education: N/A   Occupational History  . Not on file.   Social History Main Topics  . Smoking status: Never Smoker  . Smokeless tobacco: Never Used  . Alcohol use 0.0 oz/week  . Drug use: No  . Sexual activity: Yes   Other Topics Concern  . Not on file   Social History Narrative  . No narrative on file     Current Outpatient Prescriptions:  .  amphetamine-dextroamphetamine (ADDERALL) 10 MG tablet, Take 1 tablet (10 mg total) by mouth daily at 2 PM., Disp: 30 tablet, Rfl: 0 .  amphetamine-dextroamphetamine (ADDERALL) 10 MG tablet, Take 1 tablet (10 mg total) by mouth every  evening., Disp: 30 tablet, Rfl: 0 .  amphetamine-dextroamphetamine (ADDERALL) 10 MG tablet, Take 1 tablet (10 mg total) by mouth daily at 2 PM., Disp: 30 tablet, Rfl: 0 .  amphetamine-dextroamphetamine (ADDERALL) 20 MG tablet, Take 1 tablet (20 mg total) by mouth daily., Disp: 30 tablet, Rfl: 0 .  amphetamine-dextroamphetamine (ADDERALL) 20 MG tablet, Take 1 tablet (20 mg total) by mouth daily., Disp: 30 tablet, Rfl: 0 .  amphetamine-dextroamphetamine (ADDERALL) 20 MG tablet, Take 1 tablet (20 mg total) by mouth daily., Disp: 30 tablet, Rfl: 0 .  atenolol (TENORMIN) 25 MG tablet, Take 1 tablet (25 mg total) by mouth every evening., Disp: 90 tablet, Rfl: 1 .  cetirizine (ZYRTEC) 10 MG tablet, Take 1 tablet by mouth daily., Disp: , Rfl:  .  mometasone (NASONEX) 50 MCG/ACT nasal spray, Place 2 sprays into the nose at bedtime., Disp: , Rfl:  .  Olmesartan-Amlodipine-HCTZ (TRIBENZOR) 20-5-12.5 MG TABS, Take 1 tablet by mouth daily., Disp: 90 tablet, Rfl: 4 .  triamcinolone cream (KENALOG) 0.1 %, APPLY TWICE A DAY AS DIRECTED, Disp: 454 g, Rfl: 0  Allergies  Allergen Reactions  . Aspirin   . Penicillins      ROS  Constitutional: Negative for fever or weight change.  Respiratory: Negative for cough and shortness of breath.   Cardiovascular: Negative for chest  pain or palpitations.  Gastrointestinal: Negative for abdominal pain, no bowel changes.  Musculoskeletal: Negative for gait problem or joint swelling.  Skin: Negative for rash.  Neurological: Negative for dizziness or headache.  No other specific complaints in a complete review of systems (except as listed in HPI above).  Objective  Vitals:   08/30/16 0840  BP: 120/68  Pulse: 86  Resp: 16  Temp: 97.7 F (36.5 C)  SpO2: 96%  Weight: 204 lb 2 oz (92.6 kg)  Height: 5\' 10"  (1.778 m)    Body mass index is 29.29 kg/m.  Physical Exam  Constitutional: Patient appears well-developed and well-nourished. Obese No distress.  HEENT:  head atraumatic, normocephalic, pupils equal and reactive to light,  neck supple, throat within normal limits Cardiovascular: Normal rate, regular rhythm and normal heart sounds.  No murmur heard. No BLE edema. Pulmonary/Chest: Effort normal and breath sounds normal. No respiratory distress. Abdominal: Soft.  There is no tenderness. Psychiatric: Patient has a normal mood and affect. behavior is normal. Judgment and thought content normal.  PHQ2/9: Depression screen [facility] 2/9 08/30/2016 07/26/2016 01/31/2016 10/28/2015 08/08/2015  Decreased Interest 0 0 0 0 0  Down, Depressed, Hopeless 0 0 0 0 0  PHQ - 2 Score 0 0 0 0 0     Fall Risk: Fall Risk  08/30/2016 07/26/2016 01/31/2016 10/28/2015 08/08/2015  Falls in the past year? No No No No No     Assessment & Plan  1. Benign hypertension  - atenolol (TENORMIN) 25 MG tablet; Take 1 tablet (25 mg total) by mouth every evening.  Dispense: 90 tablet; Refill: 1              He is doing better, stress is down, his daughter is doing better   2. Dyslipidemia  Life style modification

## 2016-10-05 ENCOUNTER — Encounter: Payer: Self-pay | Admitting: *Deleted

## 2016-10-08 ENCOUNTER — Ambulatory Visit: Payer: 59 | Admitting: Certified Registered Nurse Anesthetist

## 2016-10-08 ENCOUNTER — Ambulatory Visit
Admission: RE | Admit: 2016-10-08 | Discharge: 2016-10-08 | Disposition: A | Discharge status: Home / Self Care | Payer: 59 | Source: Ambulatory Visit | Attending: Gastroenterology | Admitting: Gastroenterology

## 2016-10-08 ENCOUNTER — Encounter: Payer: Self-pay | Admitting: *Deleted

## 2016-10-08 ENCOUNTER — Encounter
Admission: RE | Disposition: A | Discharge status: Home / Self Care | Payer: Self-pay | Source: Ambulatory Visit | Attending: Gastroenterology

## 2016-10-08 DIAGNOSIS — Z85828 Personal history of other malignant neoplasm of skin: Secondary | ICD-10-CM | POA: Diagnosis not present

## 2016-10-08 DIAGNOSIS — R0683 Snoring: Secondary | ICD-10-CM | POA: Insufficient documentation

## 2016-10-08 DIAGNOSIS — Z880 Allergy status to penicillin: Secondary | ICD-10-CM | POA: Insufficient documentation

## 2016-10-08 DIAGNOSIS — E785 Hyperlipidemia, unspecified: Secondary | ICD-10-CM | POA: Insufficient documentation

## 2016-10-08 DIAGNOSIS — Z79899 Other long term (current) drug therapy: Secondary | ICD-10-CM | POA: Insufficient documentation

## 2016-10-08 DIAGNOSIS — K640 First degree hemorrhoids: Secondary | ICD-10-CM | POA: Insufficient documentation

## 2016-10-08 DIAGNOSIS — F988 Other specified behavioral and emotional disorders with onset usually occurring in childhood and adolescence: Secondary | ICD-10-CM | POA: Insufficient documentation

## 2016-10-08 DIAGNOSIS — Z886 Allergy status to analgesic agent status: Secondary | ICD-10-CM | POA: Diagnosis not present

## 2016-10-08 DIAGNOSIS — R739 Hyperglycemia, unspecified: Secondary | ICD-10-CM | POA: Insufficient documentation

## 2016-10-08 DIAGNOSIS — K5790 Diverticulosis of intestine, part unspecified, without perforation or abscess without bleeding: Secondary | ICD-10-CM | POA: Diagnosis not present

## 2016-10-08 DIAGNOSIS — I10 Essential (primary) hypertension: Secondary | ICD-10-CM | POA: Diagnosis not present

## 2016-10-08 DIAGNOSIS — K5730 Diverticulosis of large intestine without perforation or abscess without bleeding: Secondary | ICD-10-CM | POA: Diagnosis not present

## 2016-10-08 DIAGNOSIS — Z1211 Encounter for screening for malignant neoplasm of colon: Secondary | ICD-10-CM | POA: Insufficient documentation

## 2016-10-08 DIAGNOSIS — K648 Other hemorrhoids: Secondary | ICD-10-CM | POA: Diagnosis not present

## 2016-10-08 HISTORY — PX: COLONOSCOPY WITH PROPOFOL: SHX5780

## 2016-10-08 SURGERY — COLONOSCOPY WITH PROPOFOL
Anesthesia: General

## 2016-10-08 MED ORDER — PROPOFOL 500 MG/50ML IV EMUL
INTRAVENOUS | Status: AC
  Filled 2016-10-08: qty 50

## 2016-10-08 MED ORDER — PROPOFOL 500 MG/50ML IV EMUL
INTRAVENOUS | Status: DC | PRN
  Administered 2016-10-08: 150 ug/kg/min via INTRAVENOUS

## 2016-10-08 MED ORDER — PROPOFOL 10 MG/ML IV BOLUS
INTRAVENOUS | Status: AC
  Filled 2016-10-08: qty 20

## 2016-10-08 MED ORDER — SODIUM CHLORIDE 0.9 % IV SOLN: INTRAVENOUS | Status: DC

## 2016-10-08 MED ORDER — PROPOFOL 10 MG/ML IV BOLUS
INTRAVENOUS | Status: DC | PRN
  Administered 2016-10-08: 30 mg via INTRAVENOUS
  Administered 2016-10-08: 70 mg via INTRAVENOUS
  Administered 2016-10-08: 30 mg via INTRAVENOUS
  Administered 2016-10-08: 50 mg via INTRAVENOUS

## 2016-10-08 MED ORDER — SODIUM CHLORIDE 0.9 % IV SOLN
INTRAVENOUS | Status: DC
  Administered 2016-10-08: 1000 mL via INTRAVENOUS

## 2016-10-08 NOTE — Transfer of Care (Signed)
Immediate Anesthesia Transfer of Care Note  Patient: Victor Valdez  Procedure(s) Performed: Procedure(s): COLONOSCOPY WITH PROPOFOL (N/A)  Patient Location: PACU  Anesthesia Type:General  Level of Consciousness: awake, alert , oriented and patient cooperative  Airway & Oxygen Therapy: Patient Spontanous Breathing and Patient connected to nasal cannula oxygen  Post-op Assessment: Report given to RN and Post -op Vital signs reviewed and stable  Post vital signs: Reviewed and stable  Last Vitals:  Vitals:   10/08/16 1243 10/08/16 1334  BP: (!) 129/91 (!) 97/47  Pulse: 80 81  Resp: 16 (!) 9  Temp: 36.4 C 36.3 C    Last Pain:  Vitals:   10/08/16 1334  TempSrc: Tympanic         Complications: No apparent anesthesia complications

## 2016-10-08 NOTE — Op Note (Signed)
[facility] Gastroenterology Patient Name: Victor Valdez Procedure Date: 10/08/2016 12:53 PM MRN: [id number] Account #: [id number] Date of Birth: August 01, 1965 Admit Type: Outpatient Age: 51 Room: [facility] ENDO ROOM 3 Gender: Male Note Status: Finalized Procedure:            Colonoscopy Indications:          Screening for colorectal malignant neoplasm, This is                        the patient's first colonoscopy Providers:            Lollie Sails, MD Referring MD:         Bethena Roys. Sowles, MD (Referring MD) Medicines:            Monitored Anesthesia Care Complications:        No immediate complications. Procedure:            Pre-Anesthesia Assessment:                       - ASA Grade Assessment: II - A patient with mild                        systemic disease.                       After obtaining informed consent, the colonoscope was                        passed under direct vision. Throughout the procedure,                        the patient's blood pressure, pulse, and oxygen                        saturations were monitored continuously. The                        Colonoscope was introduced through the anus and                        advanced to the the cecum, identified by appendiceal                        orifice and ileocecal valve. The patient tolerated the                        procedure well. The quality of the bowel preparation                        was good. Findings:      A few small-mouthed diverticula were found in the sigmoid colon.      Non-bleeding internal hemorrhoids were found during retroflexion. The       hemorrhoids were small and Grade I (internal hemorrhoids that do not       prolapse).      The exam was otherwise without abnormality.      The digital rectal exam was normal. Impression:           - Diverticulosis in the sigmoid colon.                       -  Non-bleeding internal hemorrhoids.                       - The  examination was otherwise normal.                       - No specimens collected. Recommendation:       - Discharge patient to home.                       - Repeat colonoscopy in 10 years for screening purposes. Procedure Code(s):    --- Professional ---                       [phone], Colonoscopy, flexible; diagnostic, including                        collection of specimen(s) by brushing or washing, when                        performed (separate procedure) Diagnosis Code(s):    --- Professional ---                       Z12.11, Encounter for screening for malignant neoplasm                        of colon                       K64.0, First degree hemorrhoids                       K57.30, Diverticulosis of large intestine without                        perforation or abscess without bleeding CPT copyright 2016 American Medical Association. All rights reserved. The codes documented in this report are preliminary and upon coder review may  be revised to meet current compliance requirements. Lollie Sails, MD 10/08/2016 1:33:15 PM This report has been signed electronically. Number of Addenda: 0 Note Initiated On: 10/08/2016 12:53 PM Scope Withdrawal Time: 0 hours 6 minutes 50 seconds  Total Procedure Duration: 0 hours 16 minutes 38 seconds       [facility]

## 2016-10-08 NOTE — Anesthesia Post-op Follow-up Note (Cosign Needed)
Anesthesia QCDR form completed.        

## 2016-10-08 NOTE — Anesthesia Preprocedure Evaluation (Signed)
Anesthesia Evaluation  Patient identified by MRN, date of birth, ID band Patient awake    Reviewed: Allergy & Precautions, NPO status , Patient's Chart, lab work & pertinent test results, reviewed documented beta blocker date and time   History of Anesthesia Complications Negative for: history of anesthetic complications  Airway Mallampati: II       Dental   Pulmonary neg pulmonary ROS,           Cardiovascular hypertension, Pt. on medications and Pt. on home beta blockers      Neuro/Psych negative neurological ROS     GI/Hepatic negative GI ROS, Neg liver ROS,   Endo/Other  negative endocrine ROS  Renal/GU negative Renal ROS     Musculoskeletal   Abdominal   Peds  Hematology   Anesthesia Other Findings   Reproductive/Obstetrics                             Anesthesia Physical Anesthesia Plan  ASA: II  Anesthesia Plan: General   Post-op Pain Management:    Induction: Intravenous  Airway Management Planned: Nasal Cannula  Additional Equipment:   Intra-op Plan:   Post-operative Plan:   Informed Consent: I have reviewed the patients History and Physical, chart, labs and discussed the procedure including the risks, benefits and alternatives for the proposed anesthesia with the patient or authorized representative who has indicated his/her understanding and acceptance.     Plan Discussed with:   Anesthesia Plan Comments:         Anesthesia Quick Evaluation

## 2016-10-08 NOTE — H&P (Signed)
Outpatient short stay form Pre-procedure 10/08/2016 12:44 PM Lollie Sails MD  Primary Physician: Dr Vashti Hey  Reason for visit:  Screening colonoscopy  History of present illness:  Patient is a 51 year old male presenting as above. He tolerated his prep well. He takes no aspirin or blood thinning agents. This is his first colonoscopy.    Current Facility-Administered Medications:  .  0.9 %  sodium chloride infusion, , Intravenous, Continuous, Lollie Sails, MD  Prescriptions Prior to Admission  Medication Sig Dispense Refill Last Dose  . amphetamine-dextroamphetamine (ADDERALL) 10 MG tablet Take 1 tablet (10 mg total) by mouth daily at 2 PM. 30 tablet 0 10/07/2016 at Unknown time  . amphetamine-dextroamphetamine (ADDERALL) 10 MG tablet Take 1 tablet (10 mg total) by mouth every evening. 30 tablet 0 10/07/2016 at Unknown time  . amphetamine-dextroamphetamine (ADDERALL) 10 MG tablet Take 1 tablet (10 mg total) by mouth daily at 2 PM. 30 tablet 0 10/07/2016 at Unknown time  . amphetamine-dextroamphetamine (ADDERALL) 20 MG tablet Take 1 tablet (20 mg total) by mouth daily. 30 tablet 0 10/07/2016 at Unknown time  . amphetamine-dextroamphetamine (ADDERALL) 20 MG tablet Take 1 tablet (20 mg total) by mouth daily. 30 tablet 0 10/07/2016 at Unknown time  . amphetamine-dextroamphetamine (ADDERALL) 20 MG tablet Take 1 tablet (20 mg total) by mouth daily. 30 tablet 0 10/07/2016 at Unknown time  . atenolol (TENORMIN) 25 MG tablet Take 1 tablet (25 mg total) by mouth every evening. 90 tablet 1 Past Month at Unknown time  . cetirizine (ZYRTEC) 10 MG tablet Take 1 tablet by mouth daily.   Past Week at Unknown time  . mometasone (NASONEX) 50 MCG/ACT nasal spray Place 2 sprays into the nose at bedtime.   Past Week at Unknown time  . Olmesartan-Amlodipine-HCTZ (TRIBENZOR) 20-5-12.5 MG TABS Take 1 tablet by mouth daily. 90 tablet 4 10/07/2016 at Unknown time  . triamcinolone cream (KENALOG) 0.1 % APPLY TWICE A  DAY AS DIRECTED 454 g 0 Past Week at Unknown time     Allergies  Allergen Reactions  . Aspirin Anaphylaxis  . Penicillins      Past Medical History:  Diagnosis Date  . Adult attention deficit disorder   . Allergy   . Basal cell epithelioma, face    Dr. Doristine Johns  . Hyperglycemia   . Hyperlipidemia   . Hypertension   . Snoring     Review of systems:      Physical Exam    Heart and lungs: Regular rate and rhythm without rub or gallop, lungs are bilaterally clear    HEENT: Normocephalic atraumatic eyes are anicteric    Other:     Pertinant exam for procedure: Soft nontender nondistended bowel sounds positive normoactive.    Planned proceedures: Colonoscopy and indicated procedures. I have discussed the risks benefits and complications of procedures to include not limited to bleeding, infection, perforation and the risk of sedation and the patient wishes to proceed.    Lollie Sails, MD Gastroenterology 10/08/2016  12:44 PM

## 2016-10-08 NOTE — Anesthesia Postprocedure Evaluation (Signed)
Anesthesia Post Note  Patient: Victor Valdez  Procedure(s) Performed: Procedure(s) (LRB): COLONOSCOPY WITH PROPOFOL (N/A)  Patient location during evaluation: Endoscopy Anesthesia Type: General Level of consciousness: awake and alert Pain management: pain level controlled Vital Signs Assessment: post-procedure vital signs reviewed and stable Respiratory status: spontaneous breathing and respiratory function stable Cardiovascular status: stable Anesthetic complications: no     Last Vitals:  Vitals:   10/08/16 1243 10/08/16 1334  BP: (!) 129/91 (!) 97/47  Pulse: 80 81  Resp: 16 (!) 9  Temp: 36.4 C 36.3 C    Last Pain:  Vitals:   10/08/16 1334  TempSrc: Tympanic                 Anahlia Iseminger K

## 2016-10-08 NOTE — Anesthesia Procedure Notes (Signed)
Date/Time: 10/08/2016 1:09 PM Performed by: Darlyne Russian Pre-anesthesia Checklist: Patient identified, Emergency Drugs available, Suction available, Patient being monitored and Timeout performed Patient Re-evaluated:Patient Re-evaluated prior to inductionOxygen Delivery Method: Nasal cannula Placement Confirmation: positive ETCO2

## 2016-10-09 ENCOUNTER — Encounter: Payer: Self-pay | Admitting: Gastroenterology

## 2016-10-23 ENCOUNTER — Encounter: Payer: Self-pay | Admitting: Family Medicine

## 2016-10-23 ENCOUNTER — Ambulatory Visit (INDEPENDENT_AMBULATORY_CARE_PROVIDER_SITE_OTHER): Payer: 59 | Admitting: Family Medicine

## 2016-10-23 VITALS — BP 118/64 | HR 80 | Temp 98.2°F | Resp 16 | Ht 70.0 in | Wt 205.1 lb

## 2016-10-23 DIAGNOSIS — J3089 Other allergic rhinitis: Secondary | ICD-10-CM

## 2016-10-23 DIAGNOSIS — K5790 Diverticulosis of intestine, part unspecified, without perforation or abscess without bleeding: Secondary | ICD-10-CM | POA: Insufficient documentation

## 2016-10-23 DIAGNOSIS — I10 Essential (primary) hypertension: Secondary | ICD-10-CM | POA: Diagnosis not present

## 2016-10-23 DIAGNOSIS — F901 Attention-deficit hyperactivity disorder, predominantly hyperactive type: Secondary | ICD-10-CM

## 2016-10-23 DIAGNOSIS — J302 Other seasonal allergic rhinitis: Secondary | ICD-10-CM

## 2016-10-23 DIAGNOSIS — E785 Hyperlipidemia, unspecified: Secondary | ICD-10-CM | POA: Diagnosis not present

## 2016-10-23 DIAGNOSIS — J40 Bronchitis, not specified as acute or chronic: Secondary | ICD-10-CM

## 2016-10-23 DIAGNOSIS — K648 Other hemorrhoids: Secondary | ICD-10-CM | POA: Insufficient documentation

## 2016-10-23 MED ORDER — DM-GUAIFENESIN ER 60-1200 MG PO TB12: 1.0000 | ORAL_TABLET | Freq: Two times a day (BID) | ORAL | 0 refills | Status: DC

## 2016-10-23 MED ORDER — AMPHETAMINE-DEXTROAMPHETAMINE 10 MG PO TABS: 10.0000 mg | ORAL_TABLET | Freq: Every day | ORAL | 0 refills | Status: DC

## 2016-10-23 MED ORDER — PSEUDOEPHEDRINE-GUAIFENESIN ER 60-600 MG PO TB12: 1.0000 | ORAL_TABLET | Freq: Two times a day (BID) | ORAL | 0 refills | Status: DC

## 2016-10-23 MED ORDER — AMPHETAMINE-DEXTROAMPHETAMINE 10 MG PO TABS: 10.0000 mg | ORAL_TABLET | Freq: Every evening | ORAL | 0 refills | Status: DC

## 2016-10-23 MED ORDER — AMPHETAMINE-DEXTROAMPHETAMINE 20 MG PO TABS: 20.0000 mg | ORAL_TABLET | Freq: Every day | ORAL | 0 refills | Status: DC

## 2016-10-23 MED ORDER — BUDESONIDE-FORMOTEROL FUMARATE 160-4.5 MCG/ACT IN AERO: 2.0000 | INHALATION_SPRAY | Freq: Two times a day (BID) | RESPIRATORY_TRACT | 0 refills | Status: DC

## 2016-10-23 MED ORDER — AZITHROMYCIN 250 MG PO TABS: ORAL_TABLET | ORAL | 0 refills | Status: DC

## 2016-10-23 MED ORDER — OLMESARTAN-AMLODIPINE-HCTZ 20-5-12.5 MG PO TABS: 1.0000 | ORAL_TABLET | Freq: Every day | ORAL | 4 refills | Status: DC

## 2016-10-23 NOTE — Progress Notes (Signed)
Name: Victor Valdez   MRN: [id number]    DOB: 05-28-1965   Date:10/23/2016       Progress Note  Subjective  Chief Complaint  Chief Complaint  Patient presents with  . Hypertension    3 month follow up  . ADHD  . Cough    for about [redacted] weeks along with nasal congestion    HPI  HTN:  no chest pain, no SOB or palpitation, he denies orthostatic changes. He is doing better, stress is down, his daughter is doing well under therapy.   ADD: patient is doing well on medication, he denies side effects of medication, able to focus at work on medication. He takes second dose in the afternoon and is able to stay focused until 6 pm. No problems following asleep at night. No tachycardia, bp has been at goal. He denies lack of appetite He changed departments but is doing okay.   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  Bronchitis: he developed a cold 2 weeks ago, he is still having a productive cough, noticed some wheezing and mild SOB with activity. He is not taking otc medications at this time.    Patient Active Problem List   Diagnosis Date Noted  . ADD (attention deficit disorder) 11/13/2014  . Basal cell carcinoma of face 11/13/2014  . CD (contact dermatitis) 11/13/2014  . Dyslipidemia 11/13/2014  . Blood glucose elevated 11/13/2014  . Benign hypertension 11/13/2014  . Perennial allergic rhinitis with seasonal variation 11/13/2014  . Allergic rhinitis 11/13/2014    Past Surgical History:  Procedure Laterality Date  . COLONOSCOPY WITH PROPOFOL N/A 10/08/2016   Procedure: COLONOSCOPY WITH PROPOFOL;  Surgeon: Lollie Sails, MD;  Location: [facility] ENDOSCOPY;  Service: Endoscopy;  Laterality: N/A;  . EYE SURGERY Bilateral    lasik  . LASIK Bilateral 12.30.2015    Family History  Problem Relation Age of Onset  . Hypertension Mother   . Cancer Father 110        prostate cancer  . Hypertension Sister   . Obesity Sister     Social History   Social History  . Marital status: Married    Spouse name: N/A  . Number of children: N/A  . Years of education: N/A   Occupational History  . Not on file.   Social History Main Topics  . Smoking status: Never Smoker  . Smokeless tobacco: Former Systems developer  . Alcohol use 0.0 oz/week  . Drug use: No  . Sexual activity: Yes   Other Topics Concern  . Not on file   Social History Narrative  . No narrative on file     Current Outpatient Prescriptions:  .  amphetamine-dextroamphetamine (ADDERALL) 10 MG tablet, Take 1 tablet (10 mg total) by mouth daily at 2 PM., Disp: 30 tablet, Rfl: 0 .  amphetamine-dextroamphetamine (ADDERALL) 10 MG tablet, Take 1 tablet (10 mg total) by mouth every evening., Disp: 30 tablet, Rfl: 0 .  amphetamine-dextroamphetamine (ADDERALL) 10 MG tablet, Take 1 tablet (10 mg total) by mouth daily at 2 PM., Disp: 30 tablet, Rfl: 0 .  amphetamine-dextroamphetamine (ADDERALL) 20 MG tablet, Take 1 tablet (20 mg total) by mouth daily., Disp: 30 tablet, Rfl: 0 .  amphetamine-dextroamphetamine (ADDERALL) 20 MG tablet, Take 1 tablet (20 mg total) by mouth daily., Disp: 30 tablet, Rfl: 0 .  amphetamine-dextroamphetamine (ADDERALL) 20 MG tablet, Take 1  tablet (20 mg total) by mouth daily., Disp: 30 tablet, Rfl: 0 .  atenolol (TENORMIN) 25 MG tablet, Take 1 tablet (25 mg total) by mouth every evening., Disp: 90 tablet, Rfl: 1 .  azithromycin (ZITHROMAX Z-PAK) 250 MG tablet, Take 2 first day and one daily for 4 days, Disp: 6 each, Rfl: 0 .  budesonide-formoterol (SYMBICORT) 160-4.5 MCG/ACT inhaler, Inhale 2 puffs into the lungs 2 (two) times daily., Disp: 1 Inhaler, Rfl: 0 .  cetirizine (ZYRTEC) 10 MG tablet, Take 1 tablet by mouth daily., Disp: , Rfl:  .  mometasone (NASONEX) 50 MCG/ACT nasal spray, Place 2 sprays into the nose at bedtime., Disp: , Rfl:  .  Olmesartan-Amlodipine-HCTZ  (TRIBENZOR) 20-5-12.5 MG TABS, Take 1 tablet by mouth daily., Disp: 90 tablet, Rfl: 4 .  pseudoephedrine-guaifenesin (MUCINEX D) 60-600 MG 12 hr tablet, Take 1 tablet by mouth every 12 (twelve) hours., Disp: 20 tablet, Rfl: 0 .  triamcinolone cream (KENALOG) 0.1 %, APPLY TWICE A DAY AS DIRECTED, Disp: 454 g, Rfl: 0  Allergies  Allergen Reactions  . Aspirin Anaphylaxis  . Penicillins      ROS  Ten systems reviewed and is negative except as mentioned in HPI   Objective  Vitals:   10/23/16 0941  BP: 118/64  Pulse: 80  Resp: 16  Temp: 98.2 F (36.8 C)  SpO2: 97%  Weight: 205 lb 2 oz (93 kg)  Height: 5\' 10"  (1.778 m)    Body mass index is 29.43 kg/m.  Physical Exam  Constitutional: Patient appears well-developed and well-nourished. Overweight No distress.  HEENT: head atraumatic, normocephalic, pupils equal and reactive to light,  neck supple, throat within normal limits Cardiovascular: Normal rate, regular rhythm and normal heart sounds.  No murmur heard. No BLE edema. Pulmonary/Chest: Effort normal and breath sounds normal. No respiratory distress. Abdominal: Soft.  There is no tenderness. Psychiatric: Patient has a normal mood and affect. behavior is normal. Judgment and thought content normal.  PHQ2/9: Depression screen [facility] 2/9 08/30/2016 07/26/2016 01/31/2016 10/28/2015 08/08/2015  Decreased Interest 0 0 0 0 0  Down, Depressed, Hopeless 0 0 0 0 0  PHQ - 2 Score 0 0 0 0 0     Fall Risk: Fall Risk  08/30/2016 07/26/2016 01/31/2016 10/28/2015 08/08/2015  Falls in the past year? No No No No No     Assessment & Plan  1. Benign hypertension  - Olmesartan-Amlodipine-HCTZ (TRIBENZOR) 20-5-12.5 MG TABS; Take 1 tablet by mouth daily.  Dispense: 90 tablet; Refill: 4  2. Dyslipidemia  Continue medication   3. Attention deficit hyperactivity disorder (ADHD), predominantly hyperactive type  - amphetamine-dextroamphetamine (ADDERALL) 20 MG tablet; Take 1 tablet (20 mg total)  by mouth daily.  Dispense: 30 tablet; Refill: 0 - amphetamine-dextroamphetamine (ADDERALL) 10 MG tablet; Take 1 tablet (10 mg total) by mouth daily at 2 PM.  Dispense: 30 tablet; Refill: 0 - amphetamine-dextroamphetamine (ADDERALL) 10 MG tablet; Take 1 tablet (10 mg total) by mouth every evening.  Dispense: 30 tablet; Refill: 0 - amphetamine-dextroamphetamine (ADDERALL) 10 MG tablet; Take 1 tablet (10 mg total) by mouth daily at 2 PM.  Dispense: 30 tablet; Refill: 0 - amphetamine-dextroamphetamine (ADDERALL) 20 MG tablet; Take 1 tablet (20 mg total) by mouth daily.  Dispense: 30 tablet; Refill: 0 - amphetamine-dextroamphetamine (ADDERALL) 20 MG tablet; Take 1 tablet (20 mg total) by mouth daily.  Dispense: 30 tablet; Refill: 0   4. Perennial allergic rhinitis with seasonal variation  Continue medication  5. Bronchitis  - Dextromethorphan-Guaifenesin  60-1200 MG 12hr tablet; Take 1 tablet by mouth every 12 (twelve) hours.  Dispense: 30 tablet; Refill: 0

## 2017-01-16 ENCOUNTER — Encounter: Payer: Self-pay | Admitting: Family Medicine

## 2017-01-16 ENCOUNTER — Ambulatory Visit (INDEPENDENT_AMBULATORY_CARE_PROVIDER_SITE_OTHER): Payer: 59 | Admitting: Family Medicine

## 2017-01-16 VITALS — BP 106/68 | HR 68 | Temp 98.1°F | Resp 18 | Ht 70.0 in | Wt 208.4 lb

## 2017-01-16 DIAGNOSIS — E785 Hyperlipidemia, unspecified: Secondary | ICD-10-CM | POA: Diagnosis not present

## 2017-01-16 DIAGNOSIS — F901 Attention-deficit hyperactivity disorder, predominantly hyperactive type: Secondary | ICD-10-CM

## 2017-01-16 DIAGNOSIS — Z23 Encounter for immunization: Secondary | ICD-10-CM | POA: Diagnosis not present

## 2017-01-16 DIAGNOSIS — R232 Flushing: Secondary | ICD-10-CM | POA: Diagnosis not present

## 2017-01-16 DIAGNOSIS — I10 Essential (primary) hypertension: Secondary | ICD-10-CM

## 2017-01-16 DIAGNOSIS — J3089 Other allergic rhinitis: Secondary | ICD-10-CM

## 2017-01-16 DIAGNOSIS — J302 Other seasonal allergic rhinitis: Secondary | ICD-10-CM

## 2017-01-16 MED ORDER — AMPHETAMINE-DEXTROAMPHETAMINE 10 MG PO TABS: 10.0000 mg | ORAL_TABLET | Freq: Every evening | ORAL | 0 refills | Status: DC

## 2017-01-16 MED ORDER — AMPHETAMINE-DEXTROAMPHETAMINE 10 MG PO TABS: 10.0000 mg | ORAL_TABLET | Freq: Every day | ORAL | 0 refills | Status: DC

## 2017-01-16 MED ORDER — AMPHETAMINE-DEXTROAMPHETAMINE 20 MG PO TABS: 20.0000 mg | ORAL_TABLET | Freq: Every day | ORAL | 0 refills | Status: DC

## 2017-01-16 NOTE — Progress Notes (Signed)
Name: Victor Valdez   MRN: [id number]    DOB: [birth year]   Date:01/16/2017       Progress Note  Subjective  Chief Complaint  Chief Complaint  Patient presents with  . Medication Refill    3 month F/U  . Hypertension    Denies any symptoms  . ADD  . Allergic Rhinitis   . Dyslipidemia    HPI  HTN: no chest pain, no SOB or palpitation, he denies orthostatic changes. BP is at goal   ADD: patient is doing well on medication, he denies side effects of medication, able to focus at work on medication. He takes second dose in the afternoon and is able to stay focused until 6 pm. No problems following asleep at night. No tachycardia, bp has been at goal. He denies lack of appetite He changed departments but is doing okay.   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   Hot flashes: he has noticed 3 episodes of feeling flushed, and breaks out in a sweat, not associated with SOB or chest pain. Very brief. Nobody has noticed, he will try to keep a log of when it happens. This morning it happened after taking a shower   Patient Active Problem List   Diagnosis Date Noted  . Internal hemorrhoids 10/23/2016  . Diverticulosis 10/23/2016  . ADD (attention deficit disorder) 11/13/2014  . Basal cell carcinoma of face 11/13/2014  . CD (contact dermatitis) 11/13/2014  . Dyslipidemia 11/13/2014  . Blood glucose elevated 11/13/2014  . Benign hypertension 11/13/2014  . Perennial allergic rhinitis with seasonal variation 11/13/2014  . Allergic rhinitis 11/13/2014    Past Surgical History:  Procedure Laterality Date  . COLONOSCOPY WITH PROPOFOL N/A 10/08/2016   Procedure: COLONOSCOPY WITH PROPOFOL;  Surgeon: Lollie Sails, MD;  Location: [facility] ENDOSCOPY;  Service: Endoscopy;  Laterality: N/A;  . EYE SURGERY Bilateral    lasik  . LASIK  Bilateral 12.30.2015    Family History  Problem Relation Age of Onset  . Hypertension Mother   . Cancer Father 51       prostate cancer  . Hypertension Sister   . Obesity Sister     Social History   Social History  . Marital status: Married    Spouse name: N/A  . Number of children: N/A  . Years of education: N/A   Occupational History  . Not on file.   Social History Main Topics  . Smoking status: Never Smoker  . Smokeless tobacco: Former Systems developer  . Alcohol use 0.0 oz/week  . Drug use: No  . Sexual activity: Yes    Partners: Female   Other Topics Concern  . Not on file   Social History Narrative  . No narrative on file     Current Outpatient Prescriptions:  .  amphetamine-dextroamphetamine (ADDERALL) 10 MG tablet, Take 1 tablet (10 mg total) by mouth daily at 2 PM., Disp: 30 tablet, Rfl: 0 .  amphetamine-dextroamphetamine (ADDERALL) 10 MG tablet, Take 1 tablet (10 mg total) by mouth every evening., Disp: 30 tablet, Rfl: 0 .  amphetamine-dextroamphetamine (ADDERALL) 10 MG tablet, Take 1 tablet (10 mg total) by mouth daily at 2 PM., Disp: 30 tablet, Rfl: 0 .  amphetamine-dextroamphetamine (ADDERALL) 20 MG tablet, Take 1 tablet (20 mg total) by mouth daily., Disp: 30 tablet, Rfl: 0 .  amphetamine-dextroamphetamine (ADDERALL) 20 MG tablet, Take 1 tablet (20 mg total) by mouth daily., Disp: 30 tablet, Rfl: 0 .  amphetamine-dextroamphetamine (ADDERALL) 20 MG tablet, Take 1 tablet (20 mg total) by mouth daily., Disp: 30 tablet, Rfl: 0 .  atenolol (TENORMIN) 25 MG tablet, Take 1 tablet (25 mg total) by mouth every evening., Disp: 90 tablet, Rfl: 1 .  Olmesartan-Amlodipine-HCTZ (TRIBENZOR) 20-5-12.5 MG TABS, Take 1 tablet by mouth daily., Disp: 90 tablet, Rfl: 4 .  cetirizine (ZYRTEC) 10 MG tablet, Take 1 tablet by mouth daily., Disp: , Rfl:  .  mometasone (NASONEX) 50 MCG/ACT nasal spray, Place 2 sprays into the nose at bedtime., Disp: , Rfl:  .  triamcinolone cream (KENALOG)  0.1 %, APPLY TWICE A DAY AS DIRECTED (Patient not taking: Reported on 01/16/2017), Disp: 454 g, Rfl: 0  Allergies  Allergen Reactions  . Aspirin Anaphylaxis  . Penicillins      ROS  Constitutional: Negative for fever or weight change.  Respiratory: Negative for cough and shortness of breath.   Cardiovascular: Negative for chest pain or palpitations.  Gastrointestinal: Negative for abdominal pain, no bowel changes.  Musculoskeletal: Negative for gait problem or joint swelling.  Skin: Negative for rash.  Neurological: Negative for dizziness or headache.  No other specific complaints in a complete review of systems (except as listed in HPI above).  Objective  Vitals:   01/16/17 1451  BP: 106/68  Pulse: 68  Resp: 18  Temp: 98.1 F (36.7 C)  TempSrc: Oral  SpO2: 97%  Weight: 208 lb 6.4 oz (94.5 kg)  Height: 5\' 10"  (1.778 m)    Body mass index is 29.9 kg/m.  Physical Exam  Constitutional: Patient appears well-developed and well-nourished. Obese  No distress.  HEENT: head atraumatic, normocephalic, pupils equal and reactive to light,  neck supple, throat within normal limits Cardiovascular: Normal rate, regular rhythm and normal heart sounds.  No murmur heard. No BLE edema. Pulmonary/Chest: Effort normal and breath sounds normal. No respiratory distress. Abdominal: Soft.  There is no tenderness. Psychiatric: Patient has a normal mood and affect. behavior is normal. Judgment and thought content normal.  PHQ2/9: Depression screen [facility] 2/9 01/16/2017 08/30/2016 07/26/2016 01/31/2016 10/28/2015  Decreased Interest 0 0 0 0 0  Down, Depressed, Hopeless 0 0 0 0 0  PHQ - 2 Score 0 0 0 0 0     Fall Risk: Fall Risk  01/16/2017 08/30/2016 07/26/2016 01/31/2016 10/28/2015  Falls in the past year? No No No No No     Functional Status Survey: Is the patient deaf or have difficulty hearing?: No Does the patient have difficulty seeing, even when wearing glasses/contacts?: No Does the  patient have difficulty concentrating, remembering, or making decisions?: No Does the patient have difficulty walking or climbing stairs?: No Does the patient have difficulty dressing or bathing?: No Does the patient have difficulty doing errands alone such as visiting a doctor's office or shopping?: No   Assessment & Plan  1. Benign hypertension  Well controlled  2. Needs flu shot  - Flu Vaccine QUAD 6+ mos PF IM (Fluarix Quad PF)  3. Dyslipidemia   on life style modification Walking for 50 minutes daily   4. Perennial allergic rhinitis with seasonal variation  controlled  5. Attention deficit hyperactivity disorder (ADHD), predominantly hyperactive type  - amphetamine-dextroamphetamine (ADDERALL) 10 MG tablet; Take 1 tablet (10 mg total) by mouth daily at 2 PM.  Dispense: 30 tablet; Refill: 0 - amphetamine-dextroamphetamine (ADDERALL) 10 MG tablet; Take  1 tablet (10 mg total) by mouth every evening.  Dispense: 30 tablet; Refill: 0 - amphetamine-dextroamphetamine (ADDERALL) 10 MG tablet; Take 1 tablet (10 mg total) by mouth daily at 2 PM.  Dispense: 30 tablet; Refill: 0 - amphetamine-dextroamphetamine (ADDERALL) 20 MG tablet; Take 1 tablet (20 mg total) by mouth daily.  Dispense: 30 tablet; Refill: 0 - amphetamine-dextroamphetamine (ADDERALL) 20 MG tablet; Take 1 tablet (20 mg total) by mouth daily.  Dispense: 30 tablet; Refill: 0 - amphetamine-dextroamphetamine (ADDERALL) 20 MG tablet; Take 1 tablet (20 mg total) by mouth daily.  Dispense: 30 tablet; Refill: 0  6. Hot flashes  - CBC with Differential/Platelet - COMPLETE METABOLIC PANEL WITH GFR - TSH

## 2017-01-16 NOTE — Addendum Note (Signed)
Addended by: Inda Coke on: 01/16/2017 03:25 PM   Modules accepted: Orders

## 2017-01-17 LAB — COMPREHENSIVE METABOLIC PANEL
ALT: 51 IU/L — ABNORMAL HIGH (ref 0–44)
AST: 48 IU/L — ABNORMAL HIGH (ref 0–40)
Albumin/Globulin Ratio: 1.8 (ref 1.2–2.2)
Albumin: 4.4 g/dL (ref 3.5–5.5)
Alkaline Phosphatase: 84 IU/L (ref 39–117)
BUN/Creatinine Ratio: 23 — ABNORMAL HIGH (ref 9–20)
BUN: 25 mg/dL — ABNORMAL HIGH (ref 6–24)
Bilirubin Total: 0.9 mg/dL (ref 0.0–1.2)
CO2: 27 mmol/L (ref 20–29)
Calcium: 9.8 mg/dL (ref 8.7–10.2)
Chloride: 98 mmol/L (ref 96–106)
Creatinine, Ser: 1.11 mg/dL (ref 0.76–1.27)
GFR calc Af Amer: 89 mL/min/{1.73_m2} (ref >59)
GFR calc non Af Amer: 77 mL/min/{1.73_m2} (ref >59)
Globulin, Total: 2.4 g/dL (ref 1.5–4.5)
Glucose: 100 mg/dL — ABNORMAL HIGH (ref 65–99)
Potassium: 4.4 mmol/L (ref 3.5–5.2)
Sodium: 139 mmol/L (ref 134–144)
Total Protein: 6.8 g/dL (ref 6.0–8.5)

## 2017-01-17 LAB — CBC WITH DIFFERENTIAL/PLATELET
Basophils Absolute: 0 10*3/uL (ref 0.0–0.2)
Basos: 1 %
EOS (ABSOLUTE): 0.2 10*3/uL (ref 0.0–0.4)
Eos: 3 %
Hematocrit: 39.5 % (ref 37.5–51.0)
Hemoglobin: 13.6 g/dL (ref 13.0–17.7)
Immature Grans (Abs): 0 10*3/uL (ref 0.0–0.1)
Immature Granulocytes: 0 %
Lymphocytes Absolute: 2.2 10*3/uL (ref 0.7–3.1)
Lymphs: 33 %
MCH: 29.9 pg (ref 26.6–33.0)
MCHC: 34.4 g/dL (ref 31.5–35.7)
MCV: 87 fL (ref 79–97)
Monocytes Absolute: 0.8 10*3/uL (ref 0.1–0.9)
Monocytes: 13 %
Neutrophils Absolute: 3.4 10*3/uL (ref 1.4–7.0)
Neutrophils: 50 %
Platelets: 274 10*3/uL (ref 150–379)
RBC: 4.55 x10E6/uL (ref 4.14–5.80)
RDW: 13 % (ref 12.3–15.4)
WBC: 6.7 10*3/uL (ref 3.4–10.8)

## 2017-01-17 LAB — TSH: TSH: 1.29 u[IU]/mL (ref 0.450–4.500)

## 2017-01-18 ENCOUNTER — Ambulatory Visit: Payer: 59 | Admitting: Family Medicine

## 2017-01-21 ENCOUNTER — Ambulatory Visit: Payer: 59 | Admitting: Family Medicine

## 2017-04-19 ENCOUNTER — Encounter: Payer: Self-pay | Admitting: Family Medicine

## 2017-04-19 ENCOUNTER — Ambulatory Visit: Payer: 59 | Admitting: Family Medicine

## 2017-04-19 VITALS — BP 110/70 | HR 84 | Resp 14 | Ht 70.0 in | Wt 205.5 lb

## 2017-04-19 DIAGNOSIS — E785 Hyperlipidemia, unspecified: Secondary | ICD-10-CM

## 2017-04-19 DIAGNOSIS — I10 Essential (primary) hypertension: Secondary | ICD-10-CM

## 2017-04-19 DIAGNOSIS — F901 Attention-deficit hyperactivity disorder, predominantly hyperactive type: Secondary | ICD-10-CM

## 2017-04-19 MED ORDER — ATENOLOL 25 MG PO TABS: 25.0000 mg | ORAL_TABLET | Freq: Every evening | ORAL | 1 refills | Status: DC

## 2017-04-19 MED ORDER — AMPHETAMINE-DEXTROAMPHETAMINE 20 MG PO TABS: 20.0000 mg | ORAL_TABLET | Freq: Every day | ORAL | 0 refills | Status: DC

## 2017-04-19 MED ORDER — AMPHETAMINE-DEXTROAMPHETAMINE 10 MG PO TABS: 10.0000 mg | ORAL_TABLET | Freq: Every day | ORAL | 0 refills | Status: DC

## 2017-04-19 MED ORDER — AMPHETAMINE-DEXTROAMPHETAMINE 10 MG PO TABS: 10.0000 mg | ORAL_TABLET | Freq: Every evening | ORAL | 0 refills | Status: DC

## 2017-04-19 NOTE — Progress Notes (Signed)
Name: Victor Valdez   MRN: [id number]    DOB: 08-26-1965   Date:04/19/2017       Progress Note  Subjective  Chief Complaint  Chief Complaint  Patient presents with  . ADD  . Hypertension  . Medication Refill    HPI  HTN: no chest pain, no SOB or palpitation, he denies orthostatic changes. BP is at goal, HR is up   ADD: patient is doing well on medication, he denies side effects of medication, able to focus at work on medication. He takes second dose in the afternoon and is able to stay focused until 6 pm. No problems following asleep at night. He denies palpitation. He states his appetite has been normal.   AR: he states that he has no nasal congestion, sneezing or rhinorrhea  Dyslipidemia: he is not taking statin therapy, he is walking around his neighborhood and will start a bike at home  Hot flashes: resolved.   Patient Active Problem List   Diagnosis Date Noted  . Internal hemorrhoids 10/23/2016  . Diverticulosis 10/23/2016  . ADD (attention deficit disorder) 11/13/2014  . Basal cell carcinoma of face 11/13/2014  . CD (contact dermatitis) 11/13/2014  . Dyslipidemia 11/13/2014  . Blood glucose elevated 11/13/2014  . Benign hypertension 11/13/2014  . Perennial allergic rhinitis with seasonal variation 11/13/2014  . Allergic rhinitis 11/13/2014    Past Surgical History:  Procedure Laterality Date  . COLONOSCOPY WITH PROPOFOL N/A 10/08/2016   Procedure: COLONOSCOPY WITH PROPOFOL;  Surgeon: Lollie Sails, MD;  Location: [facility] ENDOSCOPY;  Service: Endoscopy;  Laterality: N/A;  . EYE SURGERY Bilateral    lasik  . LASIK Bilateral 12.30.2015    Family History  Problem Relation Age of Onset  . Hypertension Mother   . Cancer Father 25       prostate cancer  . Hypertension Sister   . Obesity Sister     Social History   Socioeconomic History  . Marital status: Married    Spouse name: Not on file  . Number of children: Not on file  . Years of education:  Not on file  . Highest education level: Not on file  Social Needs  . Financial resource strain: Not on file  . Food insecurity - worry: Not on file  . Food insecurity - inability: Not on file  . Transportation needs - medical: Not on file  . Transportation needs - non-medical: Not on file  Occupational History  . Not on file  Tobacco Use  . Smoking status: Never Smoker  . Smokeless tobacco: Former Network engineer and Sexual Activity  . Alcohol use: Yes    Alcohol/week: 0.0 oz  . Drug use: No  . Sexual activity: Yes    Partners: Female  Other Topics Concern  . Not on file  Social History Narrative  . Not on file     Current Outpatient Medications:  .  amphetamine-dextroamphetamine (ADDERALL) 10 MG tablet, Take 1 tablet (10 mg total) by mouth daily at 2 PM., Disp: 30 tablet, Rfl: 0 .  amphetamine-dextroamphetamine (ADDERALL) 10 MG tablet, Take 1 tablet (10 mg total) by mouth every evening., Disp: 30 tablet, Rfl: 0 .  amphetamine-dextroamphetamine (ADDERALL) 10 MG tablet, Take 1 tablet (10 mg total) by mouth daily at 2 PM., Disp: 30 tablet, Rfl: 0 .  amphetamine-dextroamphetamine (ADDERALL) 20 MG tablet, Take 1 tablet (20 mg total) by mouth daily., Disp: 30 tablet, Rfl: 0 .  amphetamine-dextroamphetamine (ADDERALL) 20 MG tablet, Take 1 tablet (20  mg total) by mouth daily., Disp: 30 tablet, Rfl: 0 .  amphetamine-dextroamphetamine (ADDERALL) 20 MG tablet, Take 1 tablet (20 mg total) by mouth daily., Disp: 30 tablet, Rfl: 0 .  atenolol (TENORMIN) 25 MG tablet, Take 1 tablet (25 mg total) by mouth every evening., Disp: 90 tablet, Rfl: 1 .  cetirizine (ZYRTEC) 10 MG tablet, Take 1 tablet by mouth daily., Disp: , Rfl:  .  mometasone (NASONEX) 50 MCG/ACT nasal spray, Place 2 sprays into the nose at bedtime., Disp: , Rfl:  .  Olmesartan-Amlodipine-HCTZ (TRIBENZOR) 20-5-12.5 MG TABS, Take 1 tablet by mouth daily., Disp: 90 tablet, Rfl: 4 .  triamcinolone cream (KENALOG) 0.1 %, APPLY TWICE A  DAY AS DIRECTED (Patient not taking: Reported on 01/16/2017), Disp: 454 g, Rfl: 0  Allergies  Allergen Reactions  . Aspirin Anaphylaxis  . Penicillins      ROS  Constitutional: Negative for fever or weight change.  Respiratory: Negative for cough and shortness of breath.   Cardiovascular: Negative for chest pain or palpitations.  Gastrointestinal: Negative for abdominal pain, no bowel changes.  Musculoskeletal: Negative for gait problem or joint swelling.  Skin: Negative for rash.  Neurological: Negative for dizziness or headache.  No other specific complaints in a complete review of systems (except as listed in HPI above).  Objective  Vitals:   04/19/17 0805  BP: 110/70  Pulse: (!) 114  Resp: 14  SpO2: 98%  Weight: 205 lb 8 oz (93.2 kg)  Height: 5\' 10"  (1.778 m)    Body mass index is 29.49 kg/m.  Physical Exam  Constitutional: Patient appears well-developed and well-nourished. Overweight.No distress.  HEENT: head atraumatic, normocephalic, pupils equal and reactive to light, neck supple, throat within normal limits Cardiovascular: Normal rate, regular rhythm and normal heart sounds.  No murmur heard. No BLE edema. Pulmonary/Chest: Effort normal and breath sounds normal. No respiratory distress. Abdominal: Soft.  There is no tenderness. Psychiatric: Patient has a normal mood and affect. behavior is normal. Judgment and thought content normal.  PHQ2/9: Depression screen [facility] 2/9 01/16/2017 08/30/2016 07/26/2016 01/31/2016 10/28/2015  Decreased Interest 0 0 0 0 0  Down, Depressed, Hopeless 0 0 0 0 0  PHQ - 2 Score 0 0 0 0 0     Fall Risk: Fall Risk  04/19/2017 01/16/2017 08/30/2016 07/26/2016 01/31/2016  Falls in the past year? No No No No No     Functional Status Survey: Is the patient deaf or have difficulty hearing?: No Does the patient have difficulty seeing, even when wearing glasses/contacts?: No Does the patient have difficulty concentrating, remembering, or  making decisions?: No Does the patient have difficulty walking or climbing stairs?: No Does the patient have difficulty dressing or bathing?: No Does the patient have difficulty doing errands alone such as visiting a doctor's office or shopping?: No    Assessment & Plan  1. Attention deficit hyperactivity disorder (ADHD), predominantly hyperactive type  - amphetamine-dextroamphetamine (ADDERALL) 10 MG tablet; Take 1 tablet (10 mg total) by mouth daily at 2 PM.  Dispense: 30 tablet; Refill: 0 - amphetamine-dextroamphetamine (ADDERALL) 10 MG tablet; Take 1 tablet (10 mg total) by mouth every evening.  Dispense: 30 tablet; Refill: 0 - amphetamine-dextroamphetamine (ADDERALL) 10 MG tablet; Take 1 tablet (10 mg total) by mouth daily at 2 PM.  Dispense: 30 tablet; Refill: 0 - amphetamine-dextroamphetamine (ADDERALL) 20 MG tablet; Take 1 tablet (20 mg total) by mouth daily.  Dispense: 30 tablet; Refill: 0 - amphetamine-dextroamphetamine (ADDERALL) 20 MG tablet; Take 1  tablet (20 mg total) by mouth daily.  Dispense: 30 tablet; Refill: 0 - amphetamine-dextroamphetamine (ADDERALL) 20 MG tablet; Take 1 tablet (20 mg total) by mouth daily.  Dispense: 30 tablet; Refill: 0  2. Benign hypertension  - atenolol (TENORMIN) 25 MG tablet; Take 1 tablet (25 mg total) by mouth every evening.  Dispense: 90 tablet; Refill: 1

## 2017-07-17 ENCOUNTER — Encounter: Payer: Self-pay | Admitting: Family Medicine

## 2017-07-17 ENCOUNTER — Ambulatory Visit: Payer: 59 | Admitting: Family Medicine

## 2017-07-17 VITALS — BP 120/70 | HR 80 | Resp 14 | Ht 70.0 in | Wt 206.4 lb

## 2017-07-17 DIAGNOSIS — J3089 Other allergic rhinitis: Secondary | ICD-10-CM

## 2017-07-17 DIAGNOSIS — F901 Attention-deficit hyperactivity disorder, predominantly hyperactive type: Secondary | ICD-10-CM | POA: Diagnosis not present

## 2017-07-17 DIAGNOSIS — R739 Hyperglycemia, unspecified: Secondary | ICD-10-CM | POA: Diagnosis not present

## 2017-07-17 DIAGNOSIS — E785 Hyperlipidemia, unspecified: Secondary | ICD-10-CM | POA: Diagnosis not present

## 2017-07-17 DIAGNOSIS — J302 Other seasonal allergic rhinitis: Secondary | ICD-10-CM

## 2017-07-17 DIAGNOSIS — I10 Essential (primary) hypertension: Secondary | ICD-10-CM | POA: Diagnosis not present

## 2017-07-17 MED ORDER — AMPHETAMINE-DEXTROAMPHETAMINE 20 MG PO TABS: 20.0000 mg | ORAL_TABLET | Freq: Every day | ORAL | 0 refills | Status: DC

## 2017-07-17 MED ORDER — OLMESARTAN-AMLODIPINE-HCTZ 20-5-12.5 MG PO TABS: 1.0000 | ORAL_TABLET | Freq: Every day | ORAL | 4 refills | Status: DC

## 2017-07-17 MED ORDER — AMPHETAMINE-DEXTROAMPHETAMINE 10 MG PO TABS: 10.0000 mg | ORAL_TABLET | Freq: Every evening | ORAL | 0 refills | Status: DC

## 2017-07-17 MED ORDER — AMPHETAMINE-DEXTROAMPHETAMINE 10 MG PO TABS: 10.0000 mg | ORAL_TABLET | Freq: Every day | ORAL | 0 refills | Status: DC

## 2017-07-17 MED ORDER — ATENOLOL 25 MG PO TABS: 25.0000 mg | ORAL_TABLET | Freq: Every evening | ORAL | 1 refills | Status: DC

## 2017-07-17 NOTE — Addendum Note (Signed)
Addended by: Steele Sizer F on: 07/17/2017 08:11 AM   Modules accepted: Orders

## 2017-07-17 NOTE — Progress Notes (Signed)
Name: Victor Valdez   MRN: [id number]    DOB: 19-Jan-1966   Date:07/17/2017       Progress Note  Subjective  Chief Complaint  Chief Complaint  Patient presents with  . Medication Refill  . ADD  . Hypertension    HPI  HTN: no chest pain, no SOB or palpitation, he denies orthostatic changes. BP is at goal  ADD: patient is doing well on medication, he denies side effects of medication, able to focus at work on medication. He takes second dose in the afternoon and is able to stay focused until 6 pm. No problems following asleep at night. He denies palpitation. He states his appetite has been normal.   AR: he states that he has no nasal congestion, sneezing or rhinorrhea, he stopped Nasonex, takes Zyrtec prn   Dyslipidemia: he isnottaking statin therapy, we will recheck labs   Patient Active Problem List   Diagnosis Date Noted  . Internal hemorrhoids 10/23/2016  . Diverticulosis 10/23/2016  . ADD (attention deficit disorder) 11/13/2014  . Basal cell carcinoma of face 11/13/2014  . CD (contact dermatitis) 11/13/2014  . Dyslipidemia 11/13/2014  . Blood glucose elevated 11/13/2014  . Benign hypertension 11/13/2014  . Perennial allergic rhinitis with seasonal variation 11/13/2014  . Allergic rhinitis 11/13/2014    Past Surgical History:  Procedure Laterality Date  . COLONOSCOPY WITH PROPOFOL N/A 10/08/2016   Procedure: COLONOSCOPY WITH PROPOFOL;  Surgeon: Lollie Sails, MD;  Location: [facility] ENDOSCOPY;  Service: Endoscopy;  Laterality: N/A;  . EYE SURGERY Bilateral    lasik  . LASIK Bilateral 12.30.2015    Family History  Problem Relation Age of Onset  . Hypertension Mother   . Cancer Father 28       prostate cancer  . Hypertension Sister   . Obesity Sister     Social History   Socioeconomic History  . Marital status: Married    Spouse name: Not on file  . Number of children: Not on file  . Years of education: Not on file  . Highest education level: Not on  file  Social Needs  . Financial resource strain: Not on file  . Food insecurity - worry: Not on file  . Food insecurity - inability: Not on file  . Transportation needs - medical: Not on file  . Transportation needs - non-medical: Not on file  Occupational History  . Not on file  Tobacco Use  . Smoking status: Never Smoker  . Smokeless tobacco: Former Network engineer and Sexual Activity  . Alcohol use: Yes    Alcohol/week: 0.0 oz  . Drug use: No  . Sexual activity: Yes    Partners: Female  Other Topics Concern  . Not on file  Social History Narrative  . Not on file     Current Outpatient Medications:  .  amphetamine-dextroamphetamine (ADDERALL) 10 MG tablet, Take 1 tablet (10 mg total) by mouth daily at 2 PM., Disp: 30 tablet, Rfl: 0 .  amphetamine-dextroamphetamine (ADDERALL) 10 MG tablet, Take 1 tablet (10 mg total) by mouth every evening., Disp: 30 tablet, Rfl: 0 .  amphetamine-dextroamphetamine (ADDERALL) 10 MG tablet, Take 1 tablet (10 mg total) by mouth daily at 2 PM., Disp: 30 tablet, Rfl: 0 .  amphetamine-dextroamphetamine (ADDERALL) 20 MG tablet, Take 1 tablet (20 mg total) by mouth daily., Disp: 30 tablet, Rfl: 0 .  amphetamine-dextroamphetamine (ADDERALL) 20 MG tablet, Take 1 tablet (20 mg total) by mouth daily., Disp: 30 tablet, Rfl: 0 .  amphetamine-dextroamphetamine (ADDERALL) 20 MG tablet, Take 1 tablet (20 mg total) by mouth daily., Disp: 30 tablet, Rfl: 0 .  atenolol (TENORMIN) 25 MG tablet, Take 1 tablet (25 mg total) by mouth every evening., Disp: 90 tablet, Rfl: 1 .  cetirizine (ZYRTEC) 10 MG tablet, Take 1 tablet by mouth daily., Disp: , Rfl:  .  Olmesartan-amLODIPine-HCTZ (TRIBENZOR) 20-5-12.5 MG TABS, Take 1 tablet by mouth daily., Disp: 90 tablet, Rfl: 4 .  triamcinolone cream (KENALOG) 0.1 %, APPLY TWICE A DAY AS DIRECTED, Disp: 454 g, Rfl: 0  Allergies  Allergen Reactions  . Aspirin Anaphylaxis  . Nsaids   . Penicillins       ROS  Constitutional: Negative for fever or weight change.  Respiratory: Negative for cough and shortness of breath.   Cardiovascular: Negative for chest pain or palpitations.  Gastrointestinal: Negative for abdominal pain, no bowel changes.  Musculoskeletal: Negative for gait problem or joint swelling.  Skin: Negative for rash.  Neurological: Negative for dizziness or headache.  No other specific complaints in a complete review of systems (except as listed in HPI above).  Objective  Vitals:   07/17/17 0751  BP: 120/70  Pulse: 80  Resp: 14  SpO2: 98%  Weight: 206 lb 6.4 oz (93.6 kg)  Height: 5\' 10"  (1.778 m)    Body mass index is 29.62 kg/m.  Physical Exam  Constitutional: Patient appears well-developed and well-nourished. Overweight No distress.  HEENT: head atraumatic, normocephalic, pupils equal and reactive to light, neck supple, throat within normal limits Cardiovascular: Normal rate, regular rhythm and normal heart sounds.  No murmur heard. No BLE edema. Pulmonary/Chest: Effort normal and breath sounds normal. No respiratory distress. Abdominal: Soft.  There is no tenderness. Psychiatric: Patient has a normal mood and affect. behavior is normal. Judgment and thought content normal.  PHQ2/9: Depression screen [facility] 2/9 01/16/2017 08/30/2016 07/26/2016 01/31/2016 10/28/2015  Decreased Interest 0 0 0 0 0  Down, Depressed, Hopeless 0 0 0 0 0  PHQ - 2 Score 0 0 0 0 0    Fall Risk: Fall Risk  07/17/2017 04/19/2017 01/16/2017 08/30/2016 07/26/2016  Falls in the past year? No No No No No     Functional Status Survey: Is the patient deaf or have difficulty hearing?: No Does the patient have difficulty seeing, even when wearing glasses/contacts?: No Does the patient have difficulty concentrating, remembering, or making decisions?: No Does the patient have difficulty walking or climbing stairs?: No Does the patient have difficulty dressing or bathing?: No Does the patient  have difficulty doing errands alone such as visiting a doctor's office or shopping?: No    Assessment & Plan  1. Benign hypertension  - Olmesartan-amLODIPine-HCTZ (TRIBENZOR) 20-5-12.5 MG TABS; Take 1 tablet by mouth daily.  Dispense: 90 tablet; Refill: 4 - atenolol (TENORMIN) 25 MG tablet; Take 1 tablet (25 mg total) by mouth every evening.  Dispense: 90 tablet; Refill: 1 -comp panel   2. Attention deficit hyperactivity disorder (ADHD), predominantly hyperactive type  - amphetamine-dextroamphetamine (ADDERALL) 10 MG tablet; Take 1 tablet (10 mg total) by mouth daily at 2 PM.  Dispense: 30 tablet; Refill: 0 - amphetamine-dextroamphetamine (ADDERALL) 10 MG tablet; Take 1 tablet (10 mg total) by mouth every evening.  Dispense: 30 tablet; Refill: 0 - amphetamine-dextroamphetamine (ADDERALL) 10 MG tablet; Take 1 tablet (10 mg total) by mouth daily at 2 PM.  Dispense: 30 tablet; Refill: 0 - amphetamine-dextroamphetamine (ADDERALL) 20 MG tablet; Take 1 tablet (20 mg total) by mouth daily.  Dispense: 30 tablet; Refill: 0 - amphetamine-dextroamphetamine (ADDERALL) 20 MG tablet; Take 1 tablet (20 mg total) by mouth daily.  Dispense: 30 tablet; Refill: 0 - amphetamine-dextroamphetamine (ADDERALL) 20 MG tablet; Take 1 tablet (20 mg total) by mouth daily.  Dispense: 30 tablet; Refill: 0  3. Dyslipidemia  Cannot take aspirin -Lipid panel   4. Perennial allergic rhinitis with seasonal variation   5. Hyperglycemia  - Hemoglobin A1c

## 2017-10-15 ENCOUNTER — Encounter: Payer: Self-pay | Admitting: Family Medicine

## 2017-10-15 ENCOUNTER — Ambulatory Visit: Payer: 59 | Admitting: Family Medicine

## 2017-10-15 VITALS — BP 110/76 | HR 65 | Resp 16 | Ht 70.0 in | Wt 206.6 lb

## 2017-10-15 DIAGNOSIS — F901 Attention-deficit hyperactivity disorder, predominantly hyperactive type: Secondary | ICD-10-CM

## 2017-10-15 DIAGNOSIS — I10 Essential (primary) hypertension: Secondary | ICD-10-CM | POA: Diagnosis not present

## 2017-10-15 DIAGNOSIS — J3089 Other allergic rhinitis: Secondary | ICD-10-CM

## 2017-10-15 DIAGNOSIS — E785 Hyperlipidemia, unspecified: Secondary | ICD-10-CM

## 2017-10-15 DIAGNOSIS — J302 Other seasonal allergic rhinitis: Secondary | ICD-10-CM

## 2017-10-15 MED ORDER — AMPHETAMINE-DEXTROAMPHETAMINE 10 MG PO TABS: 10.0000 mg | ORAL_TABLET | Freq: Every evening | ORAL | 0 refills | Status: DC

## 2017-10-15 MED ORDER — AMPHETAMINE-DEXTROAMPHETAMINE 10 MG PO TABS: 10.0000 mg | ORAL_TABLET | Freq: Every day | ORAL | 0 refills | Status: DC

## 2017-10-15 MED ORDER — AMPHETAMINE-DEXTROAMPHETAMINE 20 MG PO TABS: 20.0000 mg | ORAL_TABLET | Freq: Every day | ORAL | 0 refills | Status: DC

## 2017-10-15 NOTE — Progress Notes (Signed)
Name: Victor Valdez   MRN: [id number]    DOB: 1966/02/01   Date:10/15/2017       Progress Note  Subjective  Chief Complaint  Chief Complaint  Patient presents with  . ADD  . Hypertension    HPI  HTN: no chest pain, no SOB or palpitation, he denies orthostatic changes. BP is at goal, he has been compliant with medications   ADD: patient is doing well on medication, he denies side effects of medication, able to focus at work on medication. He takes second dose in the afternoon and is able to stay focused until 6 pm. No problems following asleep at night. He denies palpitation. He states his appetite has been normal.He takes also on weekends " so I don't drive my wife crazy)  AR: he states that he has no nasal congestion, sneezing or rhinorrhea, he stopped Nasonex, takes Zyrtec prn Unchanged   Dyslipidemia: he isnottaking statin therapy and did not have labs done as recommended on his last visit. Reminded him to have it done    Patient Active Problem List   Diagnosis Date Noted  . Internal hemorrhoids 10/23/2016  . Diverticulosis 10/23/2016  . ADD (attention deficit disorder) 11/13/2014  . Basal cell carcinoma of face 11/13/2014  . CD (contact dermatitis) 11/13/2014  . Dyslipidemia 11/13/2014  . Blood glucose elevated 11/13/2014  . Benign hypertension 11/13/2014  . Perennial allergic rhinitis with seasonal variation 11/13/2014  . Allergic rhinitis 11/13/2014    Past Surgical History:  Procedure Laterality Date  . COLONOSCOPY WITH PROPOFOL N/A 10/08/2016   Procedure: COLONOSCOPY WITH PROPOFOL;  Surgeon: Lollie Sails, MD;  Location: [facility] ENDOSCOPY;  Service: Endoscopy;  Laterality: N/A;  . EYE SURGERY Bilateral    lasik  . LASIK Bilateral 12.30.2015    Family History  Problem Relation Age of Onset  . Hypertension Mother   . Cancer Father 73       prostate cancer  . Hypertension Sister   . Obesity Sister     Social History   Socioeconomic History  .  Marital status: Married    Spouse name: Not on file  . Number of children: Not on file  . Years of education: Not on file  . Highest education level: Not on file  Occupational History  . Not on file  Social Needs  . Financial resource strain: Not on file  . Food insecurity:    Worry: Not on file    Inability: Not on file  . Transportation needs:    Medical: Not on file    Non-medical: Not on file  Tobacco Use  . Smoking status: Never Smoker  . Smokeless tobacco: Former Network engineer and Sexual Activity  . Alcohol use: Yes    Alcohol/week: 0.0 oz  . Drug use: No  . Sexual activity: Yes    Partners: Female  Lifestyle  . Physical activity:    Days per week: Not on file    Minutes per session: Not on file  . Stress: Not on file  Relationships  . Social connections:    Talks on phone: Not on file    Gets together: Not on file    Attends religious service: Not on file    Active member of club or organization: Not on file    Attends meetings of clubs or organizations: Not on file    Relationship status: Not on file  . Intimate partner violence:    Fear of current or ex partner: Not on  file    Emotionally abused: Not on file    Physically abused: Not on file    Forced sexual activity: Not on file  Other Topics Concern  . Not on file  Social History Narrative  . Not on file     Current Outpatient Medications:  .  amphetamine-dextroamphetamine (ADDERALL) 10 MG tablet, Take 1 tablet (10 mg total) by mouth daily at 2 PM., Disp: 30 tablet, Rfl: 0 .  amphetamine-dextroamphetamine (ADDERALL) 10 MG tablet, Take 1 tablet (10 mg total) by mouth every evening., Disp: 30 tablet, Rfl: 0 .  amphetamine-dextroamphetamine (ADDERALL) 10 MG tablet, Take 1 tablet (10 mg total) by mouth daily at 2 PM., Disp: 30 tablet, Rfl: 0 .  amphetamine-dextroamphetamine (ADDERALL) 20 MG tablet, Take 1 tablet (20 mg total) by mouth daily., Disp: 30 tablet, Rfl: 0 .  amphetamine-dextroamphetamine  (ADDERALL) 20 MG tablet, Take 1 tablet (20 mg total) by mouth daily., Disp: 30 tablet, Rfl: 0 .  amphetamine-dextroamphetamine (ADDERALL) 20 MG tablet, Take 1 tablet (20 mg total) by mouth daily., Disp: 30 tablet, Rfl: 0 .  atenolol (TENORMIN) 25 MG tablet, Take 1 tablet (25 mg total) by mouth every evening., Disp: 90 tablet, Rfl: 1 .  cetirizine (ZYRTEC) 10 MG tablet, Take 1 tablet by mouth daily., Disp: , Rfl:  .  Olmesartan-amLODIPine-HCTZ (TRIBENZOR) 20-5-12.5 MG TABS, Take 1 tablet by mouth daily., Disp: 90 tablet, Rfl: 4 .  triamcinolone cream (KENALOG) 0.1 %, APPLY TWICE A DAY AS DIRECTED, Disp: 454 g, Rfl: 0  Allergies  Allergen Reactions  . Aspirin Anaphylaxis  . Nsaids   . Penicillins      ROS  Constitutional: Negative for fever or weight change.  Respiratory: Negative for cough and shortness of breath.   Cardiovascular: Negative for chest pain or palpitations.  Gastrointestinal: Negative for abdominal pain, no bowel changes.  Musculoskeletal: Negative for gait problem or joint swelling.  Skin: Negative for rash.  Neurological: Negative for dizziness or headache.  No other specific complaints in a complete review of systems (except as listed in HPI above).  Objective  Vitals:   10/15/17 0741  BP: 110/76  Pulse: 65  Resp: 16  SpO2: 99%  Weight: 206 lb 9.6 oz (93.7 kg)  Height: 5\' 10"  (1.778 m)    Body mass index is 29.64 kg/m.  Physical Exam  Constitutional: Patient appears well-developed and well-nourished. Overweight. No distress.  HEENT: head atraumatic, normocephalic, pupils equal and reactive to light,  neck supple, throat within normal limits Cardiovascular: Normal rate, regular rhythm and normal heart sounds.  No murmur heard. No BLE edema. Pulmonary/Chest: Effort normal and breath sounds normal. No respiratory distress. Abdominal: Soft.  There is no tenderness. Psychiatric: Patient has a normal mood and affect. behavior is normal. Judgment and thought  content normal.  PHQ2/9: Depression screen [facility] 2/9 01/16/2017 08/30/2016 07/26/2016 01/31/2016 10/28/2015  Decreased Interest 0 0 0 0 0  Down, Depressed, Hopeless 0 0 0 0 0  PHQ - 2 Score 0 0 0 0 0    Fall Risk: Fall Risk  10/15/2017 07/17/2017 04/19/2017 01/16/2017 08/30/2016  Falls in the past year? No No No No No     Assessment & Plan  1. Attention deficit hyperactivity disorder (ADHD), predominantly hyperactive type  - amphetamine-dextroamphetamine (ADDERALL) 10 MG tablet; Take 1 tablet (10 mg total) by mouth daily at 2 PM.  Dispense: 30 tablet; Refill: 0 - amphetamine-dextroamphetamine (ADDERALL) 10 MG tablet; Take 1 tablet (10 mg total) by mouth every evening.  Dispense: 30 tablet; Refill: 0 - amphetamine-dextroamphetamine (ADDERALL) 10 MG tablet; Take 1 tablet (10 mg total) by mouth daily at 2 PM.  Dispense: 30 tablet; Refill: 0 - amphetamine-dextroamphetamine (ADDERALL) 20 MG tablet; Take 1 tablet (20 mg total) by mouth daily.  Dispense: 30 tablet; Refill: 0 - amphetamine-dextroamphetamine (ADDERALL) 20 MG tablet; Take 1 tablet (20 mg total) by mouth daily.  Dispense: 30 tablet; Refill: 0 - amphetamine-dextroamphetamine (ADDERALL) 20 MG tablet; Take 1 tablet (20 mg total) by mouth daily.  Dispense: 30 tablet; Refill: 0   2. Benign hypertension  At goal, continue medication   3. Dyslipidemia  Needs to have labs done  4. Perennial allergic rhinitis with seasonal variation  stable

## 2017-10-30 ENCOUNTER — Encounter: Payer: 59 | Admitting: Family Medicine

## 2017-12-08 ENCOUNTER — Other Ambulatory Visit: Payer: Self-pay | Admitting: Family Medicine

## 2017-12-08 DIAGNOSIS — I10 Essential (primary) hypertension: Secondary | ICD-10-CM

## 2018-01-13 ENCOUNTER — Encounter: Payer: Self-pay | Admitting: Family Medicine

## 2018-01-13 ENCOUNTER — Ambulatory Visit: Payer: 59 | Admitting: Family Medicine

## 2018-01-13 ENCOUNTER — Telehealth: Payer: Self-pay | Admitting: Family Medicine

## 2018-01-13 VITALS — BP 112/68 | HR 96 | Temp 98.0°F | Resp 18 | Ht 70.0 in | Wt 201.4 lb

## 2018-01-13 DIAGNOSIS — E785 Hyperlipidemia, unspecified: Secondary | ICD-10-CM

## 2018-01-13 DIAGNOSIS — I10 Essential (primary) hypertension: Secondary | ICD-10-CM | POA: Diagnosis not present

## 2018-01-13 DIAGNOSIS — Z23 Encounter for immunization: Secondary | ICD-10-CM

## 2018-01-13 DIAGNOSIS — J3089 Other allergic rhinitis: Secondary | ICD-10-CM

## 2018-01-13 DIAGNOSIS — R739 Hyperglycemia, unspecified: Secondary | ICD-10-CM

## 2018-01-13 DIAGNOSIS — J302 Other seasonal allergic rhinitis: Secondary | ICD-10-CM

## 2018-01-13 DIAGNOSIS — F901 Attention-deficit hyperactivity disorder, predominantly hyperactive type: Secondary | ICD-10-CM

## 2018-01-13 MED ORDER — AMPHETAMINE-DEXTROAMPHETAMINE 10 MG PO TABS: 30.0000 mg | ORAL_TABLET | Freq: Two times a day (BID) | ORAL | 0 refills | Status: DC

## 2018-01-13 MED ORDER — AMPHETAMINE-DEXTROAMPHETAMINE 10 MG PO TABS: 30.0000 mg | ORAL_TABLET | Freq: Every evening | ORAL | 0 refills | Status: DC

## 2018-01-13 MED ORDER — ATENOLOL 25 MG PO TABS: 25.0000 mg | ORAL_TABLET | Freq: Every evening | ORAL | 1 refills | Status: DC

## 2018-01-13 NOTE — Telephone Encounter (Signed)
Copied from [id number]. Topic: Quick Communication - Rx Refill/Question >> Jan 13, 2018  4:08 PM Marin Olp L wrote: Medication: adderral (call pharmacy to clarify scripts before they can fill)  Has the patient contacted their pharmacy? Yes.   (Agent: If no, request that the patient contact the pharmacy for the refill.) (Agent: If yes, when and what did the pharmacy advise?)  Preferred Pharmacy (with phone number or street name): [place], [place] - [address] [place] [address] Phone: [phone] Fax: [phone]  Agent: Please be advised that RX refills may take up to 3 business days. We ask that you follow-up with your pharmacy.

## 2018-01-13 NOTE — Telephone Encounter (Signed)
Called Pharmacist Kennyth Lose at [facility] and she had a question about the Adderall fill dates. On was dated 02/11/2018, 03/14/2018. So the pharmacist assumed the one that did not have a date was for today but wanted to double check before filling it.

## 2018-01-13 NOTE — Progress Notes (Signed)
Name: Victor Valdez   MRN: [id number]    DOB: [birth year]   Date:01/13/2018       Progress Note  Subjective  Chief Complaint  Chief Complaint  Patient presents with  . Follow-up    patient is here for a 3 month f/u  . Hypertension  . Allergic Rhinitis   . ADD  . Dyslipidemia  . Medication Refill  . Immunizations    flu shot    HPI  HTN: no chest pain, no SOB or palpitation, he denies orthostatic changes. BP is at goal, he has been compliant with medications No side effects of medication   ADD: patient is doing well on medication, he denies side effects of medication, able to focus at work on medication. He takes second dose in the afternoon and is able to stay focused until 6 pm. No problems following asleep at night. He denies palpitation. He is doing well on medication   AR: he states that he has no nasal congestion, sneezing or rhinorrhea, he stopped Nasonex, takes Zyrtec prn. Doing well at this time of the day  Dyslipidemia: he isnottaking statin therapy and did not have labs done as recommended on his last visit. He states had labs done at work and will bring me a copy    Patient Active Problem List   Diagnosis Date Noted  . Internal hemorrhoids 10/23/2016  . Diverticulosis 10/23/2016  . ADD (attention deficit disorder) 11/13/2014  . Basal cell carcinoma of face 11/13/2014  . CD (contact dermatitis) 11/13/2014  . Dyslipidemia 11/13/2014  . Blood glucose elevated 11/13/2014  . Benign hypertension 11/13/2014  . Perennial allergic rhinitis with seasonal variation 11/13/2014  . Allergic rhinitis 11/13/2014    Past Surgical History:  Procedure Laterality Date  . COLONOSCOPY WITH PROPOFOL N/A 10/08/2016   Procedure: COLONOSCOPY WITH PROPOFOL;  Surgeon: Lollie Sails, MD;  Location: [facility] ENDOSCOPY;  Service: Endoscopy;  Laterality: N/A;  . EYE SURGERY Bilateral    lasik  . LASIK Bilateral 12.30.2015    Family History  Problem Relation Age of Onset  .  Hypertension Mother   . Cancer Father 43       prostate cancer  . Hypertension Sister   . Obesity Sister     Social History   Socioeconomic History  . Marital status: Married    Spouse name: Nunzio Cory  . Number of children: 2  . Years of education: Not on file  . Highest education level: Master's degree (e.g., MA, MS, MEng, MEd, MSW, MBA)  Occupational History  . Not on file  Social Needs  . Financial resource strain: Not hard at all  . Food insecurity:    Worry: Never true    Inability: Never true  . Transportation needs:    Medical: No    Non-medical: No  Tobacco Use  . Smoking status: Never Smoker  . Smokeless tobacco: Former Network engineer and Sexual Activity  . Alcohol use: Yes    Alcohol/week: 0.0 standard drinks  . Drug use: No  . Sexual activity: Yes    Partners: Female    Birth control/protection: None  Lifestyle  . Physical activity:    Days per week: 5 days    Minutes per session: 60 min  . Stress: Very much  Relationships  . Social connections:    Talks on phone: More than three times a week    Gets together: More than three times a week    Attends religious service: Not on file  Active member of club or organization: Yes    Attends meetings of clubs or organizations: 1 to 4 times per year    Relationship status: Married  . Intimate partner violence:    Fear of current or ex partner: No    Emotionally abused: No    Physically abused: No    Forced sexual activity: No  Other Topics Concern  . Not on file  Social History Narrative  . Not on file     Current Outpatient Medications:  .  amphetamine-dextroamphetamine (ADDERALL) 10 MG tablet, Take 1 tablet (10 mg total) by mouth daily at 2 PM., Disp: 30 tablet, Rfl: 0 .  amphetamine-dextroamphetamine (ADDERALL) 10 MG tablet, Take 1 tablet (10 mg total) by mouth every evening., Disp: 30 tablet, Rfl: 0 .  amphetamine-dextroamphetamine (ADDERALL) 10 MG tablet, Take 1 tablet (10 mg total) by mouth  daily at 2 PM., Disp: 30 tablet, Rfl: 0 .  amphetamine-dextroamphetamine (ADDERALL) 20 MG tablet, Take 1 tablet (20 mg total) by mouth daily., Disp: 30 tablet, Rfl: 0 .  amphetamine-dextroamphetamine (ADDERALL) 20 MG tablet, Take 1 tablet (20 mg total) by mouth daily., Disp: 30 tablet, Rfl: 0 .  amphetamine-dextroamphetamine (ADDERALL) 20 MG tablet, Take 1 tablet (20 mg total) by mouth daily., Disp: 30 tablet, Rfl: 0 .  atenolol (TENORMIN) 25 MG tablet, Take 1 tablet (25 mg total) by mouth every evening., Disp: 90 tablet, Rfl: 1 .  cetirizine (ZYRTEC) 10 MG tablet, Take 1 tablet by mouth daily., Disp: , Rfl:  .  Olmesartan-amLODIPine-HCTZ 20-5-12.5 MG TABS, TAKE 1 TABLET BY MOUTH  DAILY, Disp: 90 tablet, Rfl: 4 .  triamcinolone cream (KENALOG) 0.1 %, APPLY TWICE A DAY AS DIRECTED, Disp: 454 g, Rfl: 0  Allergies  Allergen Reactions  . Aspirin Anaphylaxis  . Nsaids   . Penicillins      ROS  Constitutional: Negative for fever , positive for mild weight change.  Respiratory: Negative for cough and shortness of breath.   Cardiovascular: Negative for chest pain or palpitations.  Gastrointestinal: Negative for abdominal pain, no bowel changes.  Musculoskeletal: Negative for gait problem or joint swelling.  Skin: Negative for rash.  Neurological: Negative for dizziness or headache.  No other specific complaints in a complete review of systems (except as listed in HPI above).  Objective  Vitals:   01/13/18 1057 01/13/18 1113  BP: 112/68   Pulse: (!) 121 96  Resp: 18   Temp: 98 F (36.7 C)   TempSrc: Oral   SpO2: 98%   Weight: 201 lb 6.4 oz (91.4 kg)   Height: 5\' 10"  (1.778 m)     Body mass index is 28.9 kg/m.  Physical Exam  Constitutional: Patient appears well-developed and well-nourished. Overweight.  No distress.  HEENT: head atraumatic, normocephalic, pupils equal and reactive to light,  neck supple, throat within normal limits Cardiovascular: Normal rate, regular rhythm  and normal heart sounds.  No murmur heard. No BLE edema. Pulmonary/Chest: Effort normal and breath sounds normal. No respiratory distress. Abdominal: Soft.  There is no tenderness. Psychiatric: Patient has a normal mood and affect. behavior is normal. Judgment and thought content normal.   PHQ2/9: Depression screen [facility] 2/9 01/13/2018 01/16/2017 08/30/2016 07/26/2016 01/31/2016  Decreased Interest 0 0 0 0 0  Down, Depressed, Hopeless 0 0 0 0 0  PHQ - 2 Score 0 0 0 0 0  Altered sleeping 0 - - - -  Tired, decreased energy 0 - - - -  Change in appetite  0 - - - -  Feeling bad or failure about yourself  0 - - - -  Trouble concentrating 0 - - - -  Moving slowly or fidgety/restless 0 - - - -  Suicidal thoughts 0 - - - -  PHQ-9 Score 0 - - - -  Difficult doing work/chores Not difficult at all - - - -     Fall Risk: Fall Risk  01/13/2018 10/15/2017 07/17/2017 04/19/2017 01/16/2017  Falls in the past year? No No No No No     Functional Status Survey: Is the patient deaf or have difficulty hearing?: No Does the patient have difficulty seeing, even when wearing glasses/contacts?: No Does the patient have difficulty concentrating, remembering, or making decisions?: No Does the patient have difficulty walking or climbing stairs?: No Does the patient have difficulty dressing or bathing?: No Does the patient have difficulty doing errands alone such as visiting a doctor's office or shopping?: No    Assessment & Plan  1. Benign hypertension  - atenolol (TENORMIN) 25 MG tablet; Take 1 tablet (25 mg total) by mouth every evening.  Dispense: 90 tablet; Refill: 1  2. Need for immunization against influenza  - Flu Vaccine QUAD 6+ mos PF IM (Fluarix Quad PF)  3. Dyslipidemia   4. Hyperglycemia  He will drop off labs for me to review  5. Perennial allergic rhinitis with seasonal variation   6. Attention deficit hyperactivity disorder (ADHD), predominantly hyperactive type  -  amphetamine-dextroamphetamine (ADDERALL) 10 MG tablet; Take 3 tablets (30 mg total) by mouth 2 (two) times daily with a meal. 20 mg in am and 10 mg in the pm  Dispense: 90 tablet; Refill: 0 - amphetamine-dextroamphetamine (ADDERALL) 10 MG tablet; Take 3 tablets (30 mg total) by mouth 2 (two) times daily with a meal. 20 mg in am and 10 mg in the pm  Dispense: 90 tablet; Refill: 0 - amphetamine-dextroamphetamine (ADDERALL) 10 MG tablet; Take 3 tablets (30 mg total) by mouth every evening. 20 mg in am and 10 mg in the pm  Dispense: 90 tablet; Refill: 0

## 2018-04-11 ENCOUNTER — Telehealth: Payer: Self-pay | Admitting: Family Medicine

## 2018-04-11 ENCOUNTER — Ambulatory Visit (INDEPENDENT_AMBULATORY_CARE_PROVIDER_SITE_OTHER): Payer: 59 | Admitting: Family Medicine

## 2018-04-11 ENCOUNTER — Encounter: Payer: Self-pay | Admitting: Family Medicine

## 2018-04-11 VITALS — BP 120/70 | HR 84 | Temp 97.3°F | Resp 14 | Ht 70.0 in | Wt 203.8 lb

## 2018-04-11 DIAGNOSIS — J302 Other seasonal allergic rhinitis: Secondary | ICD-10-CM | POA: Diagnosis not present

## 2018-04-11 DIAGNOSIS — I10 Essential (primary) hypertension: Secondary | ICD-10-CM | POA: Diagnosis not present

## 2018-04-11 DIAGNOSIS — E785 Hyperlipidemia, unspecified: Secondary | ICD-10-CM | POA: Diagnosis not present

## 2018-04-11 DIAGNOSIS — E663 Overweight: Secondary | ICD-10-CM | POA: Diagnosis not present

## 2018-04-11 DIAGNOSIS — F901 Attention-deficit hyperactivity disorder, predominantly hyperactive type: Secondary | ICD-10-CM

## 2018-04-11 MED ORDER — AMPHETAMINE-DEXTROAMPHETAMINE 10 MG PO TABS: ORAL_TABLET | ORAL | 0 refills | Status: DC

## 2018-04-11 NOTE — Progress Notes (Signed)
Name: Victor Valdez   MRN: [id number]    DOB: 07-21-1965   Date:04/11/2018       Progress Note  Subjective  Chief Complaint  Chief Complaint  Patient presents with  . Medication Refill    HPI  HTN: no chest pain, no SOB or palpitation, he denies orthostatic changes. BP is at goal today, he has been compliant with medicationsNo side effects of medication. Taking Olmesartan-amlodipine-HCTZ and atenolol daily. No changes from previous visits.  ADD: Patient is doing well on medication - has been on same dose for about 8 years.  He denies side effects of medication, able to focus at work on medication. He takes second dose in the afternoon and is able to stay focused until 6 pm. No problems falling asleep at night. He denies palpitation. He is doing well on medication.  Current dose is 20mg  in the mornings and 10mg  in the afternoon. Controlled substance database is checked and no suspicious findings.  AR: he states that he has no nasal congestion, sneezing or rhinorrhea, he stopped Nasonex, takes Zyrtec prn. Doing well at this time of the day  Dyslipidemia: he isnottaking statin therapyand did not have labs done as recommended on his last visit. He states had labs done at work and will bring me a copy - we will request  These from him again today. Diet is poor.  Overweight: Walks 2.5 miles a day and does tae kwon do twice weekly with his son. Diet is terrible for breakfast and lunch - eats out for lunch.  Dinner is healthy because his wife cooks.   Patient Active Problem List   Diagnosis Date Noted  . Internal hemorrhoids 10/23/2016  . Diverticulosis 10/23/2016  . ADD (attention deficit disorder) 11/13/2014  . Basal cell carcinoma of face 11/13/2014  . CD (contact dermatitis) 11/13/2014  . Dyslipidemia 11/13/2014  . Blood glucose elevated 11/13/2014  . Benign hypertension 11/13/2014  . Perennial allergic rhinitis with seasonal variation 11/13/2014  . Allergic rhinitis  11/13/2014    Social History   Tobacco Use  . Smoking status: Never Smoker  . Smokeless tobacco: Former Network engineer Use Topics  . Alcohol use: Yes    Alcohol/week: 0.0 standard drinks     Current Outpatient Medications:  .  amphetamine-dextroamphetamine (ADDERALL) 10 MG tablet, Take 3 tablets (30 mg total) by mouth 2 (two) times daily with a meal. 20 mg in am and 10 mg in the pm, Disp: 90 tablet, Rfl: 0 .  amphetamine-dextroamphetamine (ADDERALL) 10 MG tablet, Take 3 tablets (30 mg total) by mouth every evening. 20 mg in am and 10 mg in the pm, Disp: 90 tablet, Rfl: 0 .  amphetamine-dextroamphetamine (ADDERALL) 10 MG tablet, Take 3 tablets (30 mg total) by mouth 2 (two) times daily with a meal. 20 mg in am and 10 mg in the pm, Disp: 90 tablet, Rfl: 0 .  atenolol (TENORMIN) 25 MG tablet, Take 1 tablet (25 mg total) by mouth every evening., Disp: 90 tablet, Rfl: 1 .  cetirizine (ZYRTEC) 10 MG tablet, Take 1 tablet by mouth daily., Disp: , Rfl:  .  Olmesartan-amLODIPine-HCTZ 20-5-12.5 MG TABS, TAKE 1 TABLET BY MOUTH  DAILY, Disp: 90 tablet, Rfl: 4 .  triamcinolone cream (KENALOG) 0.1 %, APPLY TWICE A DAY AS DIRECTED, Disp: 454 g, Rfl: 0  Allergies  Allergen Reactions  . Aspirin Anaphylaxis  . Nsaids   . Penicillins     I personally reviewed active problem list, medication list, allergies,  health maintenance, notes from last encounter, lab results with the patient/caregiver today.  ROS  Constitutional: Negative for fever or weight change.  Respiratory: Negative for cough and shortness of breath.   Cardiovascular: Negative for chest pain or palpitations.  Gastrointestinal: Negative for abdominal pain, no bowel changes.  Musculoskeletal: Negative for gait problem or joint swelling.  Skin: Negative for rash.  Neurological: Negative for dizziness or headache.  No other specific complaints in a complete review of systems (except as listed in HPI above).  Objective  Vitals:    04/11/18 0845  BP: 120/70  Pulse: 84  Resp: 14  Temp: (!) 97.3 F (36.3 C)  TempSrc: Oral  SpO2: 97%  Weight: 203 lb 12.8 oz (92.4 kg)  Height: 5\' 10"  (1.778 m)   Body mass index is 29.24 kg/m.  Nursing Note and Vital Signs reviewed.  Physical Exam  Constitutional: Patient appears well-developed and well-nourished. No distress.  HENT: Head: Normocephalic and atraumatic. Ears: bilateral TMs with no erythema or effusion; Nose: Nose normal. Eyes: Conjunctivae and EOM are normal. No scleral icterus. Neck: Normal range of motion. Neck supple. No JVD present. No thyromegaly present.  Cardiovascular: Normal rate, regular rhythm and normal heart sounds.  No murmur heard. No BLE edema. Pulmonary/Chest: Effort normal and breath sounds normal. No respiratory distress. Musculoskeletal: Normal range of motion, no joint effusions. No gross deformities Neurological: Pt is alert and oriented to person, place, and time. No cranial nerve deficit. Coordination, balance, strength, speech and gait are normal.  Skin: Skin is warm and dry. No rash noted. No erythema.  Psychiatric: Patient has a normal mood and affect. behavior is normal. Judgment and thought content normal.  No results found for this or any previous visit (from the past 72 hour(s)).  Assessment & Plan  1. Benign hypertension - Stable on medications; need labs, but he declines today and states will bring to our office what he has had done with [facility]  2. Dyslipidemia  Need labs, but he declines today and states will bring to our office what he has had done with [facility]  3. Seasonal allergic rhinitis, unspecified trigger - Stable on Zyrtec  4. Overweight (BMI 25.0-29.9) - Discussed importance of 150 minutes of physical activity weekly, eat two servings of fish weekly, eat one serving of tree nuts ( cashews, pistachios, pecans, almonds.[name]) every other day, eat 6 servings of fruit/vegetables daily and drink plenty of water and avoid  sweet beverages.   5. Attention deficit hyperactivity disorder (ADHD), predominantly hyperactive type - amphetamine-dextroamphetamine (ADDERALL) 10 MG tablet; 20 mg in am and 10 mg in the pm  Dispense: 90 tablet; Refill: 0 - amphetamine-dextroamphetamine (ADDERALL) 10 MG tablet; 20 mg in am and 10 mg in the pm  Dispense: 90 tablet; Refill: 0 - amphetamine-dextroamphetamine (ADDERALL) 10 MG tablet; 20 mg in am and 10 mg in the pm  Dispense: 90 tablet; Refill: 0

## 2018-04-11 NOTE — Telephone Encounter (Signed)
Please call patient on Monday and remind him to bring in [place] labs.

## 2018-04-11 NOTE — Telephone Encounter (Signed)
Patient notified

## 2018-04-29 DIAGNOSIS — J1189 Influenza due to unidentified influenza virus with other manifestations: Secondary | ICD-10-CM | POA: Diagnosis not present

## 2018-07-10 ENCOUNTER — Ambulatory Visit: Payer: 59 | Admitting: Family Medicine

## 2018-07-10 ENCOUNTER — Encounter: Payer: Self-pay | Admitting: Family Medicine

## 2018-07-10 VITALS — BP 104/70 | HR 85 | Temp 97.6°F | Resp 16 | Ht 70.0 in | Wt 205.8 lb

## 2018-07-10 DIAGNOSIS — F341 Dysthymic disorder: Secondary | ICD-10-CM | POA: Diagnosis not present

## 2018-07-10 DIAGNOSIS — E785 Hyperlipidemia, unspecified: Secondary | ICD-10-CM

## 2018-07-10 DIAGNOSIS — I10 Essential (primary) hypertension: Secondary | ICD-10-CM | POA: Diagnosis not present

## 2018-07-10 DIAGNOSIS — Z131 Encounter for screening for diabetes mellitus: Secondary | ICD-10-CM

## 2018-07-10 DIAGNOSIS — R5383 Other fatigue: Secondary | ICD-10-CM

## 2018-07-10 DIAGNOSIS — F901 Attention-deficit hyperactivity disorder, predominantly hyperactive type: Secondary | ICD-10-CM

## 2018-07-10 DIAGNOSIS — R6882 Decreased libido: Secondary | ICD-10-CM

## 2018-07-10 DIAGNOSIS — Z1159 Encounter for screening for other viral diseases: Secondary | ICD-10-CM

## 2018-07-10 MED ORDER — AMPHETAMINE-DEXTROAMPHETAMINE 10 MG PO TABS: ORAL_TABLET | ORAL | 0 refills | Status: DC

## 2018-07-10 MED ORDER — ATENOLOL 25 MG PO TABS: 25.0000 mg | ORAL_TABLET | Freq: Every evening | ORAL | 1 refills | Status: DC

## 2018-07-10 NOTE — Patient Instructions (Signed)
Call [facility] therapy

## 2018-07-10 NOTE — Progress Notes (Signed)
Name: Victor Valdez   MRN: [id number]    DOB: 1965-09-06   Date:07/10/2018       Progress Note  Subjective  Chief Complaint  Chief Complaint  Patient presents with  . Medication Refill  . Hypertension  . ADD    HPI  HTN: no chest pain, no SOB or palpitation, he denies orthostatic changes. BP is slightly low today, but he has not taken Adderal yet, he has been compliant with medications  ADD: patient is doing well on medication, he denies side effects of medication, able to focus at work on medication. He takes second dose in the afternoon and is able to stay focused until 6 pm. No problems following asleep at night. He denies palpitation. Unchanged  AR: he states that he has no nasal congestion, sneezing or rhinorrhea, he stopped Nasonex, takes Zyrtec prn. He needs refill of medication   Dyslipidemia: he isnottaking statin therapyand did not have labs done as recommended on his last visit. 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 He states home life is great but feeling like he is hitting mid life crisis. He denies suicidal thoughts or ideation   Patient Active Problem List   Diagnosis Date Noted  . Internal hemorrhoids 10/23/2016  . Diverticulosis 10/23/2016  . ADD (attention deficit disorder) 11/13/2014  . Basal cell carcinoma of face 11/13/2014  . CD (contact dermatitis) 11/13/2014  . Dyslipidemia 11/13/2014  . Blood glucose elevated 11/13/2014  . Benign hypertension 11/13/2014  . Perennial allergic rhinitis with seasonal variation 11/13/2014  . Allergic rhinitis 11/13/2014    Past Surgical History:  Procedure Laterality Date  . COLONOSCOPY WITH PROPOFOL N/A 10/08/2016   Procedure: COLONOSCOPY WITH PROPOFOL;  Surgeon: Lollie Sails, MD;  Location: [facility] ENDOSCOPY;  Service: Endoscopy;   Laterality: N/A;  . EYE SURGERY Bilateral    lasik  . LASIK Bilateral 12.30.2015    Family History  Problem Relation Age of Onset  . Hypertension Mother   . Cancer Father 35       prostate cancer  . Hypertension Sister   . Obesity Sister     Social History   Socioeconomic History  . Marital status: Married    Spouse name: Nunzio Cory  . Number of children: 2  . Years of education: Not on file  . Highest education level: Master's degree (e.g., MA, MS, MEng, MEd, MSW, MBA)  Occupational History  . Not on file  Social Needs  . Financial resource strain: Not hard at all  . Food insecurity:    Worry: Never true    Inability: Never true  . Transportation needs:    Medical: No    Non-medical: No  Tobacco Use  . Smoking status: Never Smoker  . Smokeless tobacco: Former Network engineer and Sexual Activity  . Alcohol use: Yes    Alcohol/week: 0.0 standard drinks  . Drug use: No  . Sexual activity: Yes    Partners: Female    Birth control/protection: None  Lifestyle  . Physical activity:    Days per week: 5 days    Minutes per session: 60 min  . Stress: Very much  Relationships  . Social connections:    Talks on phone: More than three times a week    Gets together:  More than three times a week    Attends religious service: Not on file    Active member of club or organization: Yes    Attends meetings of clubs or organizations: 1 to 4 times per year    Relationship status: Married  . Intimate partner violence:    Fear of current or ex partner: No    Emotionally abused: No    Physically abused: No    Forced sexual activity: No  Other Topics Concern  . Not on file  Social History Narrative  . Not on file     Current Outpatient Medications:  .  amphetamine-dextroamphetamine (ADDERALL) 10 MG tablet, 20 mg in am and 10 mg in the pm, Disp: 90 tablet, Rfl: 0 .  amphetamine-dextroamphetamine (ADDERALL) 10 MG tablet, 20 mg in am and 10 mg in the pm, Disp: 90 tablet, Rfl:  0 .  amphetamine-dextroamphetamine (ADDERALL) 10 MG tablet, 20 mg in am and 10 mg in the pm, Disp: 90 tablet, Rfl: 0 .  atenolol (TENORMIN) 25 MG tablet, Take 1 tablet (25 mg total) by mouth every evening., Disp: 90 tablet, Rfl: 1 .  cetirizine (ZYRTEC) 10 MG tablet, Take 1 tablet by mouth daily., Disp: , Rfl:  .  Olmesartan-amLODIPine-HCTZ 20-5-12.5 MG TABS, TAKE 1 TABLET BY MOUTH  DAILY, Disp: 90 tablet, Rfl: 4 .  triamcinolone cream (KENALOG) 0.1 %, APPLY TWICE A DAY AS DIRECTED, Disp: 454 g, Rfl: 0  Allergies  Allergen Reactions  . Aspirin Anaphylaxis  . Nsaids   . Penicillins     I personally reviewed active problem list, medication list, allergies, family history, social history with the patient/caregiver today.   ROS  Constitutional: Negative for fever or weight change.  Respiratory: Negative for cough and shortness of breath.   Cardiovascular: Negative for chest pain or palpitations.  Gastrointestinal: Negative for abdominal pain, no bowel changes.  Musculoskeletal: Negative for gait problem or joint swelling.  Skin: Negative for rash.  Neurological: Negative for dizziness or headache.  No other specific complaints in a complete review of systems (except as listed in HPI above).  Objective  Vitals:   07/10/18 0756  BP: 104/70  Pulse: 85  Resp: 16  Temp: 97.6 F (36.4 C)  TempSrc: Oral  SpO2: 98%  Weight: 205 lb 12.8 oz (93.4 kg)  Height: 5\' 10"  (1.778 m)    Body mass index is 29.53 kg/m.  Physical Exam  Constitutional: Patient appears well-developed and well-nourished. Obese  No distress.  HEENT: head atraumatic, normocephalic, pupils equal and reactive to light,  neck supple, throat within normal limits Cardiovascular: Normal rate, regular rhythm and normal heart sounds.  No murmur heard. No BLE edema. Pulmonary/Chest: Effort normal and breath sounds normal. No respiratory distress. Abdominal: Soft.  There is no tenderness. Psychiatric: Patient has a  normal mood and affect. behavior is normal. Judgment and thought content normal.  PHQ2/9: Depression screen [facility] 2/9 07/10/2018 04/11/2018 01/13/2018 01/16/2017 08/30/2016  Decreased Interest 2 0 0 0 0  Down, Depressed, Hopeless 1 0 0 0 0  PHQ - 2 Score 3 0 0 0 0  Altered sleeping 1 0 0 - -  Tired, decreased energy 2 0 0 - -  Change in appetite 2 0 0 - -  Feeling bad or failure about yourself  0 0 0 - -  Trouble concentrating 1 0 0 - -  Moving slowly or fidgety/restless 0 0 0 - -  Suicidal thoughts 0 0 0 - -  PHQ-9  Score 9 0 0 - -  Difficult doing work/chores Somewhat difficult Not difficult at all Not difficult at all - -   Phq9 is high and positive, discussed counseling if no improvement he will return sooner for medication  Fall Risk: Fall Risk  07/10/2018 04/11/2018 01/13/2018 10/15/2017 07/17/2017  Falls in the past year? 0 0 No No No  Number falls in past yr: 0 0 - - -  Injury with Fall? 0 0 - - -    Assessment & Plan  1. Attention deficit hyperactivity disorder (ADHD), predominantly hyperactive type  - amphetamine-dextroamphetamine (ADDERALL) 10 MG tablet; 20 mg in am and 10 mg in the pm  Dispense: 90 tablet; Refill: 0 - amphetamine-dextroamphetamine (ADDERALL) 10 MG tablet; 20 mg in am and 10 mg in the pm  Dispense: 90 tablet; Refill: 0 - amphetamine-dextroamphetamine (ADDERALL) 10 MG tablet; 20 mg in am and 10 mg in the pm  Dispense: 90 tablet; Refill: 0  2. Benign hypertension  - atenolol (TENORMIN) 25 MG tablet; Take 1 tablet (25 mg total) by mouth every evening.  Dispense: 90 tablet; Refill: 1  3. Dysthymia  Discussed counseling. He is feeling burned out.   4. Other fatigue  - CBC with Differential/Platelet - TSH - VITAMIN D 25 Hydroxy (Vit-D Deficiency, Fractures) - B12 and Folate Panel - Testosterone,Free and Total  5. Dyslipidemia  - Lipid panel  6. Diabetes mellitus screening  - Hemoglobin A1c  7. Need for hepatitis C screening test  - Hepatitis C  antibody  8. Decreased libido  - Testosterone,Free and Total

## 2018-07-15 DIAGNOSIS — E785 Hyperlipidemia, unspecified: Secondary | ICD-10-CM | POA: Diagnosis not present

## 2018-07-15 DIAGNOSIS — R5383 Other fatigue: Secondary | ICD-10-CM | POA: Diagnosis not present

## 2018-07-15 DIAGNOSIS — Z131 Encounter for screening for diabetes mellitus: Secondary | ICD-10-CM | POA: Diagnosis not present

## 2018-07-15 DIAGNOSIS — I10 Essential (primary) hypertension: Secondary | ICD-10-CM | POA: Diagnosis not present

## 2018-07-16 ENCOUNTER — Other Ambulatory Visit: Payer: Self-pay | Admitting: Family Medicine

## 2018-07-16 DIAGNOSIS — E559 Vitamin D deficiency, unspecified: Secondary | ICD-10-CM

## 2018-07-16 DIAGNOSIS — R945 Abnormal results of liver function studies: Principal | ICD-10-CM

## 2018-07-16 DIAGNOSIS — R7989 Other specified abnormal findings of blood chemistry: Secondary | ICD-10-CM

## 2018-07-16 LAB — LIPID PANEL
Chol/HDL Ratio: 2.5 ratio (ref 0.0–5.0)
Cholesterol, Total: 205 mg/dL — ABNORMAL HIGH (ref 100–199)
HDL: 82 mg/dL (ref >39)
LDL Calculated: 108 mg/dL — ABNORMAL HIGH (ref 0–99)
Triglycerides: 76 mg/dL (ref 0–149)
VLDL Cholesterol Cal: 15 mg/dL (ref 5–40)

## 2018-07-16 LAB — CBC WITH DIFFERENTIAL/PLATELET
Basophils Absolute: 0.1 10*3/uL (ref 0.0–0.2)
Basos: 1 %
EOS (ABSOLUTE): 0.2 10*3/uL (ref 0.0–0.4)
Eos: 4 %
Hematocrit: 41.7 % (ref 37.5–51.0)
Hemoglobin: 14.3 g/dL (ref 13.0–17.7)
Immature Grans (Abs): 0 10*3/uL (ref 0.0–0.1)
Immature Granulocytes: 0 %
Lymphocytes Absolute: 1.4 10*3/uL (ref 0.7–3.1)
Lymphs: 28 %
MCH: 30.6 pg (ref 26.6–33.0)
MCHC: 34.3 g/dL (ref 31.5–35.7)
MCV: 89 fL (ref 79–97)
Monocytes Absolute: 0.7 10*3/uL (ref 0.1–0.9)
Monocytes: 14 %
Neutrophils Absolute: 2.7 10*3/uL (ref 1.4–7.0)
Neutrophils: 53 %
Platelets: 184 10*3/uL (ref 150–450)
RBC: 4.67 x10E6/uL (ref 4.14–5.80)
RDW: 11.7 % (ref 11.6–15.4)
WBC: 4.9 10*3/uL (ref 3.4–10.8)

## 2018-07-16 LAB — COMPREHENSIVE METABOLIC PANEL
ALT: 190 IU/L — ABNORMAL HIGH (ref 0–44)
AST: 215 IU/L — ABNORMAL HIGH (ref 0–40)
Albumin/Globulin Ratio: 1.7 (ref 1.2–2.2)
Albumin: 4.4 g/dL (ref 3.8–4.9)
Alkaline Phosphatase: 86 IU/L (ref 39–117)
BUN/Creatinine Ratio: 18 (ref 9–20)
BUN: 22 mg/dL (ref 6–24)
Bilirubin Total: 1.9 mg/dL — ABNORMAL HIGH (ref 0.0–1.2)
CO2: 26 mmol/L (ref 20–29)
Calcium: 9.6 mg/dL (ref 8.7–10.2)
Chloride: 93 mmol/L — ABNORMAL LOW (ref 96–106)
Creatinine, Ser: 1.25 mg/dL (ref 0.76–1.27)
GFR calc Af Amer: 76 mL/min/{1.73_m2} (ref >59)
GFR calc non Af Amer: 66 mL/min/{1.73_m2} (ref >59)
Globulin, Total: 2.6 g/dL (ref 1.5–4.5)
Glucose: 92 mg/dL (ref 65–99)
Potassium: 3.5 mmol/L (ref 3.5–5.2)
Sodium: 139 mmol/L (ref 134–144)
Total Protein: 7 g/dL (ref 6.0–8.5)

## 2018-07-16 LAB — HEPATITIS C ANTIBODY: Hep C Virus Ab: <0.1 s/co ratio (ref 0.0–0.9)

## 2018-07-16 LAB — TESTOSTERONE,FREE AND TOTAL
Testosterone, Free: 11.2 pg/mL (ref 7.2–24.0)
Testosterone: 465 ng/dL (ref 264–916)

## 2018-07-16 LAB — TSH: TSH: 1.36 u[IU]/mL (ref 0.450–4.500)

## 2018-07-16 LAB — B12 AND FOLATE PANEL
Folate: 6.7 ng/mL (ref >3.0)
Vitamin B-12: 1046 pg/mL (ref 232–1245)

## 2018-07-16 LAB — VITAMIN D 25 HYDROXY (VIT D DEFICIENCY, FRACTURES): Vit D, 25-Hydroxy: 13.9 ng/mL — ABNORMAL LOW (ref 30.0–100.0)

## 2018-07-16 LAB — HEMOGLOBIN A1C
Est. average glucose Bld gHb Est-mCnc: 108 mg/dL
Hgb A1c MFr Bld: 5.4 % (ref 4.8–5.6)

## 2018-07-16 MED ORDER — VITAMIN D (ERGOCALCIFEROL) 1.25 MG (50000 UNIT) PO CAPS: 50000.0000 [IU] | ORAL_CAPSULE | ORAL | 0 refills | Status: DC

## 2018-10-08 ENCOUNTER — Encounter: Payer: Self-pay | Admitting: Family Medicine

## 2018-10-08 ENCOUNTER — Ambulatory Visit (INDEPENDENT_AMBULATORY_CARE_PROVIDER_SITE_OTHER): Payer: 59 | Admitting: Family Medicine

## 2018-10-08 VITALS — Wt 192.0 lb

## 2018-10-08 DIAGNOSIS — I10 Essential (primary) hypertension: Secondary | ICD-10-CM

## 2018-10-08 DIAGNOSIS — F901 Attention-deficit hyperactivity disorder, predominantly hyperactive type: Secondary | ICD-10-CM

## 2018-10-08 DIAGNOSIS — R945 Abnormal results of liver function studies: Secondary | ICD-10-CM

## 2018-10-08 DIAGNOSIS — F341 Dysthymic disorder: Secondary | ICD-10-CM

## 2018-10-08 DIAGNOSIS — E559 Vitamin D deficiency, unspecified: Secondary | ICD-10-CM

## 2018-10-08 DIAGNOSIS — R7989 Other specified abnormal findings of blood chemistry: Secondary | ICD-10-CM

## 2018-10-08 DIAGNOSIS — E785 Hyperlipidemia, unspecified: Secondary | ICD-10-CM

## 2018-10-08 MED ORDER — AMPHETAMINE-DEXTROAMPHETAMINE 10 MG PO TABS: ORAL_TABLET | ORAL | 0 refills | Status: DC

## 2018-10-08 MED ORDER — VITAMIN D (ERGOCALCIFEROL) 1.25 MG (50000 UNIT) PO CAPS: 50000.0000 [IU] | ORAL_CAPSULE | ORAL | 0 refills | Status: DC

## 2018-10-08 NOTE — Progress Notes (Signed)
Name: Victor Valdez   MRN: [id number]    DOB: 23-Oct-1965   Date:10/08/2018       Progress Note  Subjective  Chief Complaint  Chief Complaint  Patient presents with  . Follow-up    I connected with  Victor Valdez  on 10/08/18 at  8:40 AM EDT by a video enabled telemedicine application and verified that I am speaking with the correct person using two identifiers.  I discussed the limitations of evaluation and management by telemedicine and the availability of in person appointments. The patient expressed understanding and agreed to proceed. Staff also discussed with the patient that there may be a patient responsible charge related to this service. Patient Location: at home Provider Location: [facility]   HPI  HTN: no chest pain, no SOB or palpitation, he denies orthostatic changes. He was unable to check his bp at home. He is compliant with medication.   ADD: patient is doing well on medication, he denies side effects and states medication helps with focus.[name] He takes second dose in the afternoon and is able to stay focused until 6 pm. No problems following asleep at night. He denies palpitation.   Dyslipidemia: he isnottaking statin therapy, he had lipid panel in March that showed improvement of LDL and liver enzymes were high, he was supposed to have repeat labs but not done yet, reminded him to go for repeat labs   Dysthymia: he is doing much better since last visit, he states life has slowed down since COVID-19. He has been working from home, having more time for his family and is feeling much better.   Patient Active Problem List   Diagnosis Date Noted  . Internal hemorrhoids 10/23/2016  . Diverticulosis 10/23/2016  . ADD (attention deficit disorder) 11/13/2014  . Basal cell carcinoma of face 11/13/2014  . CD (contact dermatitis) 11/13/2014  . Dyslipidemia 11/13/2014  . Blood glucose elevated 11/13/2014  . Benign hypertension 11/13/2014  . Perennial  allergic rhinitis with seasonal variation 11/13/2014  . Allergic rhinitis 11/13/2014    Past Surgical History:  Procedure Laterality Date  . COLONOSCOPY WITH PROPOFOL N/A 10/08/2016   Procedure: COLONOSCOPY WITH PROPOFOL;  Surgeon: Lollie Sails, MD;  Location: [facility] ENDOSCOPY;  Service: Endoscopy;  Laterality: N/A;  . EYE SURGERY Bilateral    lasik  . LASIK Bilateral 12.30.2015    Family History  Problem Relation Age of Onset  . Hypertension Mother   . Cancer Father 16       prostate cancer  . Hypertension Sister   . Obesity Sister     Social History   Socioeconomic History  . Marital status: Married    Spouse name: Nunzio Cory  . Number of children: 2  . Years of education: Not on file  . Highest education level: Master's degree (e.g., MA, MS, MEng, MEd, MSW, MBA)  Occupational History  . Not on file  Social Needs  . Financial resource strain: Not hard at all  . Food insecurity:    Worry: Never true    Inability: Never true  . Transportation needs:    Medical: No    Non-medical: No  Tobacco Use  . Smoking status: Never Smoker  . Smokeless tobacco: Former Network engineer and Sexual Activity  . Alcohol use: Yes    Alcohol/week: 0.0 standard drinks  . Drug use: No  . Sexual activity: Yes    Partners: Female    Birth control/protection: None  Lifestyle  . Physical activity:  Days per week: 5 days    Minutes per session: 60 min  . Stress: Very much  Relationships  . Social connections:    Talks on phone: More than three times a week    Gets together: More than three times a week    Attends religious service: Not on file    Active member of club or organization: Yes    Attends meetings of clubs or organizations: 1 to 4 times per year    Relationship status: Married  . Intimate partner violence:    Fear of current or ex partner: No    Emotionally abused: No    Physically abused: No    Forced sexual activity: No  Other Topics Concern  . Not on file   Social History Narrative  . Not on file     Current Outpatient Medications:  .  amphetamine-dextroamphetamine (ADDERALL) 10 MG tablet, 20 mg in am and 10 mg in the pm, Disp: 90 tablet, Rfl: 0 .  amphetamine-dextroamphetamine (ADDERALL) 10 MG tablet, 20 mg in am and 10 mg in the pm, Disp: 90 tablet, Rfl: 0 .  amphetamine-dextroamphetamine (ADDERALL) 10 MG tablet, 20 mg in am and 10 mg in the pm, Disp: 90 tablet, Rfl: 0 .  atenolol (TENORMIN) 25 MG tablet, Take 1 tablet (25 mg total) by mouth every evening., Disp: 90 tablet, Rfl: 1 .  cetirizine (ZYRTEC) 10 MG tablet, Take 1 tablet by mouth daily., Disp: , Rfl:  .  Olmesartan-amLODIPine-HCTZ 20-5-12.5 MG TABS, TAKE 1 TABLET BY MOUTH  DAILY, Disp: 90 tablet, Rfl: 4 .  triamcinolone cream (KENALOG) 0.1 %, APPLY TWICE A DAY AS DIRECTED, Disp: 454 g, Rfl: 0 .  Vitamin D, Ergocalciferol, (DRISDOL) 1.25 MG (50000 UT) CAPS capsule, Take 1 capsule (50,000 Units total) by mouth every 7 (seven) days., Disp: 12 capsule, Rfl: 0  Allergies  Allergen Reactions  . Aspirin Anaphylaxis  . Nsaids   . Penicillins     I personally reviewed active problem list, medication list, allergies, family history, social history with the patient/caregiver today.   ROS  Ten systems reviewed and is negative except as mentioned in HPI   Objective  Virtual encounter, vitals not obtained.  Body mass index is 27.55 kg/m.  Physical Exam  Awake, alert and oriented   PHQ2/9: Depression screen [facility] 2/9 10/08/2018 07/10/2018 04/11/2018 01/13/2018 01/16/2017  Decreased Interest 0 2 0 0 0  Down, Depressed, Hopeless 0 1 0 0 0  PHQ - 2 Score 0 3 0 0 0  Altered sleeping 0 1 0 0 -  Tired, decreased energy 0 2 0 0 -  Change in appetite 0 2 0 0 -  Feeling bad or failure about yourself  0 0 0 0 -  Trouble concentrating 1 1 0 0 -  Moving slowly or fidgety/restless 0 0 0 0 -  Suicidal thoughts 0 0 0 0 -  PHQ-9 Score 1 9 0 0 -  Difficult doing work/chores Not difficult at all  Somewhat difficult Not difficult at all Not difficult at all -   PHQ-2/9 Result is negative.    Fall Risk: Fall Risk  07/10/2018 04/11/2018 01/13/2018 10/15/2017 07/17/2017  Falls in the past year? 0 0 No No No  Number falls in past yr: 0 0 - - -  Injury with Fall? 0 0 - - -     Assessment & Plan  1. Benign hypertension   2. Attention deficit hyperactivity disorder (ADHD), predominantly hyperactive type  -  amphetamine-dextroamphetamine (ADDERALL) 10 MG tablet; 20 mg in am and 10 mg in the pm  Dispense: 90 tablet; Refill: 0 - amphetamine-dextroamphetamine (ADDERALL) 10 MG tablet; 20 mg in am and 10 mg in the pm  Dispense: 90 tablet; Refill: 0 - amphetamine-dextroamphetamine (ADDERALL) 10 MG tablet; 20 mg in am and 10 mg in the pm  Dispense: 90 tablet; Refill: 0  3. Dyslipidemia   4. Dysthymia  resolved  5. Vitamin D deficiency  - Vitamin D, Ergocalciferol, (DRISDOL) 1.25 MG (50000 UT) CAPS capsule; Take 1 capsule (50,000 Units total) by mouth every 7 (seven) days.  Dispense: 12 capsule; Refill: 0  6. Elevated LFTs  - Hepatitis panel, acute - Comprehensive metabolic panel I discussed the assessment and treatment plan with the patient. The patient was provided an opportunity to ask questions and all were answered. The patient agreed with the plan and demonstrated an understanding of the instructions.  The patient was advised to call back or seek an in-person evaluation if the symptoms worsen or if the condition fails to improve as anticipated.  I provided 25 minutes of non-face-to-face time during this encounter.

## 2018-12-04 ENCOUNTER — Telehealth: Payer: Self-pay | Admitting: Family Medicine

## 2018-12-04 NOTE — Telephone Encounter (Signed)
Medication Refill - Medication: amphetamine-dextroamphetamine (ADDERALL) 10 MG tablet  Pt would like to get filled tomorrow, but it not technically due until 8/4.  Ok to fill early? Please advise pharmacy.   Preferred Pharmacy:  [place], [place] - [place] [phone] (Phone) [phone] (Fax)

## 2018-12-04 NOTE — Telephone Encounter (Signed)
Called pharmacy they are aware.

## 2019-01-06 ENCOUNTER — Ambulatory Visit (INDEPENDENT_AMBULATORY_CARE_PROVIDER_SITE_OTHER): Payer: 59 | Admitting: Family Medicine

## 2019-01-06 ENCOUNTER — Encounter: Payer: Self-pay | Admitting: Family Medicine

## 2019-01-06 VITALS — Temp 97.8°F | Wt 197.2 lb

## 2019-01-06 DIAGNOSIS — E785 Hyperlipidemia, unspecified: Secondary | ICD-10-CM | POA: Diagnosis not present

## 2019-01-06 DIAGNOSIS — R739 Hyperglycemia, unspecified: Secondary | ICD-10-CM

## 2019-01-06 DIAGNOSIS — I10 Essential (primary) hypertension: Secondary | ICD-10-CM | POA: Diagnosis not present

## 2019-01-06 DIAGNOSIS — F901 Attention-deficit hyperactivity disorder, predominantly hyperactive type: Secondary | ICD-10-CM

## 2019-01-06 DIAGNOSIS — J302 Other seasonal allergic rhinitis: Secondary | ICD-10-CM

## 2019-01-06 DIAGNOSIS — J3089 Other allergic rhinitis: Secondary | ICD-10-CM

## 2019-01-06 DIAGNOSIS — E559 Vitamin D deficiency, unspecified: Secondary | ICD-10-CM

## 2019-01-06 MED ORDER — VITAMIN D (ERGOCALCIFEROL) 1.25 MG (50000 UNIT) PO CAPS: 50000.0000 [IU] | ORAL_CAPSULE | ORAL | 0 refills | Status: DC

## 2019-01-06 MED ORDER — AMPHETAMINE-DEXTROAMPHETAMINE 10 MG PO TABS: ORAL_TABLET | ORAL | 0 refills | Status: DC

## 2019-01-06 MED ORDER — ATENOLOL 25 MG PO TABS: 25.0000 mg | ORAL_TABLET | Freq: Every evening | ORAL | 1 refills | Status: DC

## 2019-01-06 MED ORDER — OLMESARTAN-AMLODIPINE-HCTZ 20-5-12.5 MG PO TABS: 1.0000 | ORAL_TABLET | Freq: Every day | ORAL | 4 refills | Status: DC

## 2019-01-06 NOTE — Progress Notes (Signed)
Name: Victor Valdez   MRN: [id number]    DOB: 1966/04/05   Date:01/06/2019       Progress Note  Subjective  Chief Complaint  Chief Complaint  Patient presents with  . Hypertension  . Hyperlipidemia  . ADD    HPI  HTN: no chest pain, no SOB or palpitation, he denies orthostatic changes. He was unable to check his bp at home. He is compliant with medication. Advised him to have bp in our office and flu vaccine in the next couple of weeks  ADD: patient is doing well on medication, he denies side effects and states medication helps with focus.[name] He takes second dose in the afternoon and is able to stay focused until 6 pm. No problems following asleep at night. He denies palpitation. He needs refill of his medications  Dyslipidemia: he isnottaking statin therapy, he had lipid panel in March that showed improvement of LDL and liver enzymes were high, he was supposed to have repeat labs in June, but he did not go to have it done yet   Dysthymia: he is doing much better since last visit, he states life has slowed down since COVID-19. He has been working from home, having more time for his family and is feeling much better. He is seeing therapist at [facility] and it has also helped him . It was hard to adjust to working remotely but he is doing well now   Patient Active Problem List   Diagnosis Date Noted  . Internal hemorrhoids 10/23/2016  . Diverticulosis 10/23/2016  . ADD (attention deficit disorder) 11/13/2014  . Basal cell carcinoma of face 11/13/2014  . CD (contact dermatitis) 11/13/2014  . Dyslipidemia 11/13/2014  . Blood glucose elevated 11/13/2014  . Benign hypertension 11/13/2014  . Perennial allergic rhinitis with seasonal variation 11/13/2014  . Allergic rhinitis 11/13/2014    Past Surgical History:  Procedure Laterality Date  . COLONOSCOPY WITH PROPOFOL N/A 10/08/2016   Procedure: COLONOSCOPY WITH PROPOFOL;  Surgeon: Lollie Sails, MD;  Location: [facility] ENDOSCOPY;   Service: Endoscopy;  Laterality: N/A;  . EYE SURGERY Bilateral    lasik  . LASIK Bilateral 12.30.2015    Family History  Problem Relation Age of Onset  . Hypertension Mother   . Cancer Father 87       prostate cancer  . Hypertension Sister   . Obesity Sister     Social History   Socioeconomic History  . Marital status: Married    Spouse name: Nunzio Cory  . Number of children: 2  . Years of education: Not on file  . Highest education level: Master's degree (e.g., MA, MS, MEng, MEd, MSW, MBA)  Occupational History  . Not on file  Social Needs  . Financial resource strain: Not hard at all  . Food insecurity    Worry: Never true    Inability: Never true  . Transportation needs    Medical: No    Non-medical: No  Tobacco Use  . Smoking status: Never Smoker  . Smokeless tobacco: Former Network engineer and Sexual Activity  . Alcohol use: Yes    Alcohol/week: 0.0 standard drinks  . Drug use: No  . Sexual activity: Yes    Partners: Female    Birth control/protection: None  Lifestyle  . Physical activity    Days per week: 5 days    Minutes per session: 60 min  . Stress: Very much  Relationships  . Social connections    Talks on phone: More than  three times a week    Gets together: More than three times a week    Attends religious service: Not on file    Active member of club or organization: Yes    Attends meetings of clubs or organizations: 1 to 4 times per year    Relationship status: Married  . Intimate partner violence    Fear of current or ex partner: No    Emotionally abused: No    Physically abused: No    Forced sexual activity: No  Other Topics Concern  . Not on file  Social History Narrative  . Not on file     Current Outpatient Medications:  .  amphetamine-dextroamphetamine (ADDERALL) 10 MG tablet, 20 mg in am and 10 mg in the pm, Disp: 90 tablet, Rfl: 0 .  amphetamine-dextroamphetamine (ADDERALL) 10 MG tablet, 20 mg in am and 10 mg in the pm, Disp:  90 tablet, Rfl: 0 .  amphetamine-dextroamphetamine (ADDERALL) 10 MG tablet, 20 mg in am and 10 mg in the pm, Disp: 90 tablet, Rfl: 0 .  atenolol (TENORMIN) 25 MG tablet, Take 1 tablet (25 mg total) by mouth every evening., Disp: 90 tablet, Rfl: 1 .  cetirizine (ZYRTEC) 10 MG tablet, Take 1 tablet by mouth daily., Disp: , Rfl:  .  Olmesartan-amLODIPine-HCTZ 20-5-12.5 MG TABS, TAKE 1 TABLET BY MOUTH  DAILY, Disp: 90 tablet, Rfl: 4 .  triamcinolone cream (KENALOG) 0.1 %, APPLY TWICE A DAY AS DIRECTED, Disp: 454 g, Rfl: 0 .  Vitamin D, Ergocalciferol, (DRISDOL) 1.25 MG (50000 UT) CAPS capsule, Take 1 capsule (50,000 Units total) by mouth every 7 (seven) days., Disp: 12 capsule, Rfl: 0  Allergies  Allergen Reactions  . Aspirin Anaphylaxis  . Nsaids   . Penicillins     I personally reviewed active problem list, medication list, allergies, family history, social history, health maintenance with the patient/caregiver today.   ROS  Constitutional: Negative for fever or weight change.  Respiratory: Negative for cough and shortness of breath.   Cardiovascular: Negative for chest pain or palpitations.  Gastrointestinal: Negative for abdominal pain, no bowel changes.  Musculoskeletal: Negative for gait problem or joint swelling.  Skin: Negative for rash.  Neurological: Negative for dizziness or headache.  No other specific complaints in a complete review of systems (except as listed in HPI above).  Objective  There were no vitals filed for this visit.  There is no height or weight on file to calculate BMI.  Physical Exam  Awake, alert and oriented   PHQ2/9: Depression screen [facility] 2/9 01/06/2019 10/08/2018 07/10/2018 04/11/2018 01/13/2018  Decreased Interest 0 0 2 0 0  Down, Depressed, Hopeless 0 0 1 0 0  PHQ - 2 Score 0 0 3 0 0  Altered sleeping 3 0 1 0 0  Tired, decreased energy 0 0 2 0 0  Change in appetite 0 0 2 0 0  Feeling bad or failure about yourself  0 0 0 0 0  Trouble concentrating  0 1 1 0 0  Moving slowly or fidgety/restless 0 0 0 0 0  Suicidal thoughts 0 0 0 0 0  PHQ-9 Score 3 1 9  0 0  Difficult doing work/chores Not difficult at all Not difficult at all Somewhat difficult Not difficult at all Not difficult at all    phq 9 is negative, he states always had problem sleeping, still able to sleep about 7 hours    Fall Risk: Fall Risk  01/06/2019 07/10/2018 04/11/2018 01/13/2018 10/15/2017  Falls in the past year? 0 0 0 No No  Number falls in past yr: 0 0 0 - -  Injury with Fall? 0 0 0 - -     Assessment & Plan  1. Benign hypertension  - atenolol (TENORMIN) 25 MG tablet; Take 1 tablet (25 mg total) by mouth every evening.  Dispense: 90 tablet; Refill: 1 - Olmesartan-amLODIPine-HCTZ 20-5-12.5 MG TABS; Take 1 tablet by mouth daily.  Dispense: 90 tablet; Refill: 4  2. Attention deficit hyperactivity disorder (ADHD), predominantly hyperactive type  - amphetamine-dextroamphetamine (ADDERALL) 10 MG tablet; 20 mg in am and 10 mg in the pm  Dispense: 90 tablet; Refill: 0 - amphetamine-dextroamphetamine (ADDERALL) 10 MG tablet; 20 mg in am and 10 mg in the pm  Dispense: 90 tablet; Refill: 0 - amphetamine-dextroamphetamine (ADDERALL) 10 MG tablet; 20 mg in am and 10 mg in the pm  Dispense: 90 tablet; Refill: 0  3. Dyslipidemia   4. Hyperglycemia   5. Perennial allergic rhinitis with seasonal variation   6. Vitamin D deficiency  - Vitamin D, Ergocalciferol, (DRISDOL) 1.25 MG (50000 UT) CAPS capsule; Take 1 capsule (50,000 Units total) by mouth every 7 (seven) days.  Dispense: 12 capsule; Refill: 0

## 2019-01-09 ENCOUNTER — Ambulatory Visit: Payer: 59 | Admitting: Family Medicine

## 2019-03-26 ENCOUNTER — Encounter: Payer: Self-pay | Admitting: Family Medicine

## 2019-03-26 ENCOUNTER — Ambulatory Visit (INDEPENDENT_AMBULATORY_CARE_PROVIDER_SITE_OTHER): Payer: 59 | Admitting: Family Medicine

## 2019-03-26 ENCOUNTER — Other Ambulatory Visit: Payer: Self-pay

## 2019-03-26 VITALS — Wt 192.0 lb

## 2019-03-26 DIAGNOSIS — E559 Vitamin D deficiency, unspecified: Secondary | ICD-10-CM | POA: Diagnosis not present

## 2019-03-26 DIAGNOSIS — F901 Attention-deficit hyperactivity disorder, predominantly hyperactive type: Secondary | ICD-10-CM | POA: Diagnosis not present

## 2019-03-26 DIAGNOSIS — I10 Essential (primary) hypertension: Secondary | ICD-10-CM

## 2019-03-26 DIAGNOSIS — R7989 Other specified abnormal findings of blood chemistry: Secondary | ICD-10-CM

## 2019-03-26 DIAGNOSIS — E785 Hyperlipidemia, unspecified: Secondary | ICD-10-CM

## 2019-03-26 DIAGNOSIS — R739 Hyperglycemia, unspecified: Secondary | ICD-10-CM

## 2019-03-26 MED ORDER — AMPHETAMINE-DEXTROAMPHETAMINE 10 MG PO TABS: ORAL_TABLET | ORAL | 0 refills | Status: DC

## 2019-03-26 NOTE — Progress Notes (Signed)
Name: Victor Valdez   MRN: [id number]    DOB: [birth year]   Date:03/26/2019       Progress Note  Subjective  Chief Complaint  Chief Complaint  Patient presents with  . Hypertension  . ADHD  . Hyperlipidemia  . Hyperglycemia    I connected with  [name]  on 03/26/19 at  8:20 AM EST by a video enabled telemedicine application and verified that I am speaking with the correct person using two identifiers.  I discussed the limitations of evaluation and management by telemedicine and the availability of in person appointments. The patient expressed understanding and agreed to proceed. Staff also discussed with the patient that there may be a patient responsible charge related to this service. Patient Location: at home  Provider Location: [facility]   HPI  HTN: no chest pain, no SOB or palpitation, he denies orthostatic changes.He was unable to check his bp at home. He is compliant with medication. Continue medications   ADD: patient is doing well on medication, he denies side effectsand states medication helps with focus.[name] He takes second dose in the afternoon and is able to stay focused until 6 pm. No problems following asleep at night. He denies palpitation, we will send refills to his pharmacy today   Dyslipidemia: he isnottaking statin therapy, he had lipid panel in March that showed improvement of LDL and liver enzymes were high  Elevated liver enzymes:  he was supposed to have repeat labs in June, but he did not go to have it done yet . AST was 215 and ALT was 190, explained importance of rechecking it. He denies nausea, vomiting, jaundice, abdominal pain, change in appetite, he has lost 10 lbs since last year, but has been stable now  Dysthymia: he is doing much better since last visit, he states life has slowed down since COVID-19. He has been working from home, having more time for his family and is feeling much better.He is seeing therapist at  [facility] and it has also helped him . It was hard to adjust to working remotely but he is doing well now  Patient Active Problem List   Diagnosis Date Noted  . Internal hemorrhoids 10/23/2016  . Diverticulosis 10/23/2016  . ADD (attention deficit disorder) 11/13/2014  . Basal cell carcinoma of face 11/13/2014  . CD (contact dermatitis) 11/13/2014  . Dyslipidemia 11/13/2014  . Blood glucose elevated 11/13/2014  . Benign hypertension 11/13/2014  . Perennial allergic rhinitis with seasonal variation 11/13/2014  . Allergic rhinitis 11/13/2014    Past Surgical History:  Procedure Laterality Date  . COLONOSCOPY WITH PROPOFOL N/A 10/08/2016   Procedure: COLONOSCOPY WITH PROPOFOL;  Surgeon: Lollie Sails, MD;  Location: [facility] ENDOSCOPY;  Service: Endoscopy;  Laterality: N/A;  . EYE SURGERY Bilateral    lasik  . LASIK Bilateral 12.30.2015    Family History  Problem Relation Age of Onset  . Hypertension Mother   . Cancer Father 86       prostate cancer  . Hypertension Sister   . Obesity Sister     Social History   Socioeconomic History  . Marital status: Married    Spouse name: Nunzio Cory  . Number of children: 2  . Years of education: Not on file  . Highest education level: Master's degree (e.g., MA, MS, MEng, MEd, MSW, MBA)  Occupational History  . Not on file  Social Needs  . Financial resource strain: Not hard at all  . Food insecurity  Worry: Never true    Inability: Never true  . Transportation needs    Medical: No    Non-medical: No  Tobacco Use  . Smoking status: Never Smoker  . Smokeless tobacco: Former Network engineer and Sexual Activity  . Alcohol use: Yes    Alcohol/week: 0.0 standard drinks  . Drug use: No  . Sexual activity: Yes    Partners: Female    Birth control/protection: None  Lifestyle  . Physical activity    Days per week: 5 days    Minutes per session: 60 min  . Stress: Very much  Relationships  . Social connections    Talks on phone:  More than three times a week    Gets together: More than three times a week    Attends religious service: Not on file    Active member of club or organization: Yes    Attends meetings of clubs or organizations: 1 to 4 times per year    Relationship status: Married  . Intimate partner violence    Fear of current or ex partner: No    Emotionally abused: No    Physically abused: No    Forced sexual activity: No  Other Topics Concern  . Not on file  Social History Narrative  . Not on file     Current Outpatient Medications:  .  amphetamine-dextroamphetamine (ADDERALL) 10 MG tablet, 20 mg in am and 10 mg in the pm, Disp: 90 tablet, Rfl: 0 .  amphetamine-dextroamphetamine (ADDERALL) 10 MG tablet, 20 mg in am and 10 mg in the pm, Disp: 90 tablet, Rfl: 0 .  amphetamine-dextroamphetamine (ADDERALL) 10 MG tablet, 20 mg in am and 10 mg in the pm, Disp: 90 tablet, Rfl: 0 .  atenolol (TENORMIN) 25 MG tablet, Take 1 tablet (25 mg total) by mouth every evening., Disp: 90 tablet, Rfl: 1 .  cetirizine (ZYRTEC) 10 MG tablet, Take 1 tablet by mouth daily., Disp: , Rfl:  .  Olmesartan-amLODIPine-HCTZ 20-5-12.5 MG TABS, Take 1 tablet by mouth daily., Disp: 90 tablet, Rfl: 4 .  triamcinolone cream (KENALOG) 0.1 %, APPLY TWICE A DAY AS DIRECTED, Disp: 454 g, Rfl: 0 .  Vitamin D, Ergocalciferol, (DRISDOL) 1.25 MG (50000 UT) CAPS capsule, Take 1 capsule (50,000 Units total) by mouth every 7 (seven) days., Disp: 12 capsule, Rfl: 0  Allergies  Allergen Reactions  . Aspirin Anaphylaxis  . Nsaids   . Penicillins     I personally reviewed active problem list, medication list, allergies, family history, social history, health maintenance with the patient/caregiver today.   ROS  Ten systems reviewed and is negative except as mentioned in HPI   Objective  Virtual encounter, vitals not obtained.  There is no height or weight on file to calculate BMI.  Physical Exam  Awake, alert and oriented   PHQ2/9: Depression screen [facility] 2/9 03/26/2019 01/06/2019 10/08/2018 07/10/2018 04/11/2018  Decreased Interest 0 0 0 2 0  Down, Depressed, Hopeless 0 0 0 1 0  PHQ - 2 Score 0 0 0 3 0  Altered sleeping 0 3 0 1 0  Tired, decreased energy 0 0 0 2 0  Change in appetite 0 0 0 2 0  Feeling bad or failure about yourself  0 0 0 0 0  Trouble concentrating 0 0 1 1 0  Moving slowly or fidgety/restless 0 0 0 0 0  Suicidal thoughts 0 0 0 0 0  PHQ-9 Score 0 3 1 9  0  Difficult doing  work/chores - Not difficult at all Not difficult at all Somewhat difficult Not difficult at all  Some recent data might be hidden   PHQ-2/9 Result is negative.    Fall Risk: Fall Risk  03/26/2019 01/06/2019 07/10/2018 04/11/2018 01/13/2018  Falls in the past year? 0 0 0 0 No  Number falls in past yr: 0 0 0 0 -  Injury with Fall? 0 0 0 0 -     Assessment & Plan  1. Attention deficit hyperactivity disorder (ADHD), predominantly hyperactive type  - amphetamine-dextroamphetamine (ADDERALL) 10 MG tablet; 20 mg in am and 10 mg in the pm  Dispense: 90 tablet; Refill: 0 - amphetamine-dextroamphetamine (ADDERALL) 10 MG tablet; 20 mg in am and 10 mg in the pm  Dispense: 90 tablet; Refill: 0 - amphetamine-dextroamphetamine (ADDERALL) 10 MG tablet; 20 mg in am and 10 mg in the pm  Dispense: 90 tablet; Refill: 0  2. Dyslipidemia  Last labs were good for him   3. Benign hypertension  Continue medications   4. Vitamin D deficiency  Take supplementations  5. Hyperglycemia  Last A1C was normal   6. Elevated LFTs  He needs to have labs done but afraid to go to lab statin due to COVID-19, discussed symptoms to look for   I discussed the assessment and treatment plan with the patient. The patient was provided an opportunity to ask questions and all were answered. The patient agreed with the plan and demonstrated an understanding of the instructions.  The patient was advised to call back or seek an in-person evaluation if the  symptoms worsen or if the condition fails to improve as anticipated.  I provided 25  minutes of non-face-to-face time during this encounter.

## 2019-04-20 DIAGNOSIS — D239 Other benign neoplasm of skin, unspecified: Secondary | ICD-10-CM

## 2019-04-20 HISTORY — DX: Other benign neoplasm of skin, unspecified: D23.9

## 2019-05-20 ENCOUNTER — Other Ambulatory Visit: Payer: Self-pay | Admitting: Family Medicine

## 2019-05-20 DIAGNOSIS — I10 Essential (primary) hypertension: Secondary | ICD-10-CM

## 2019-06-08 ENCOUNTER — Other Ambulatory Visit: Payer: Self-pay | Admitting: Family Medicine

## 2019-06-08 DIAGNOSIS — F901 Attention-deficit hyperactivity disorder, predominantly hyperactive type: Secondary | ICD-10-CM

## 2019-06-08 NOTE — Telephone Encounter (Signed)
Requested medication (s) are due for refill today: yes  Requested medication (s) are on the active medication list: yes  Last refill:  03/26/2019  Future visit scheduled: no  Notes to clinic:  this refill cannot be delegated    Requested Prescriptions  Pending Prescriptions Disp Refills   amphetamine-dextroamphetamine (ADDERALL) 10 MG tablet [Pharmacy Med Name: AMPHETAMINE-DEXTROAMPHETAMINE 10 MG] 90 tablet     Sig: TAKE 2 TABLETS BY MOUTH EVERY MORNING AND 1 TABLET EVERY EVENING      Not Delegated - Psychiatry:  Stimulants/ADHD Failed - 06/08/2019  8:24 AM      Failed - This refill cannot be delegated      Failed - Urine Drug Screen completed in last 360 days.      Passed - Valid encounter within last 3 months    Recent Outpatient Visits           2 months ago Dyslipidemia   [facility] Steele Sizer, MD   5 months ago Benign hypertension   [facility] Steele Sizer, MD   8 months ago Benign hypertension   [facility] Steele Sizer, MD   11 months ago Benign hypertension   [facility] Steele Sizer, MD   1 year ago Benign hypertension   [place], Astrid Divine, 

## 2019-06-10 NOTE — Telephone Encounter (Signed)
Pharmacist states Adderall filled on June 08, 2019. Accidentally sent request by accident

## 2019-07-01 ENCOUNTER — Ambulatory Visit (INDEPENDENT_AMBULATORY_CARE_PROVIDER_SITE_OTHER): Payer: 59 | Admitting: Family Medicine

## 2019-07-01 ENCOUNTER — Encounter: Payer: Self-pay | Admitting: Family Medicine

## 2019-07-01 DIAGNOSIS — F901 Attention-deficit hyperactivity disorder, predominantly hyperactive type: Secondary | ICD-10-CM | POA: Diagnosis not present

## 2019-07-01 DIAGNOSIS — F341 Dysthymic disorder: Secondary | ICD-10-CM

## 2019-07-01 DIAGNOSIS — E785 Hyperlipidemia, unspecified: Secondary | ICD-10-CM

## 2019-07-01 DIAGNOSIS — R7989 Other specified abnormal findings of blood chemistry: Secondary | ICD-10-CM

## 2019-07-01 DIAGNOSIS — E559 Vitamin D deficiency, unspecified: Secondary | ICD-10-CM

## 2019-07-01 DIAGNOSIS — I10 Essential (primary) hypertension: Secondary | ICD-10-CM | POA: Diagnosis not present

## 2019-07-01 MED ORDER — AMPHETAMINE-DEXTROAMPHETAMINE 10 MG PO TABS: ORAL_TABLET | ORAL | 0 refills | Status: DC

## 2019-07-01 NOTE — Progress Notes (Signed)
Name: Victor Valdez   MRN: [id number]    DOB: [birth year]   Date:07/01/2019       Progress Note  Subjective  Chief Complaint  Chief Complaint  Patient presents with  . Hypertension  . ADHD  . Dyslipidemia    I connected with  Victor Valdez  on 07/01/19 at  8:40 AM EST by a video enabled telemedicine application and verified that I am speaking with the correct person using two identifiers.  I discussed the limitations of evaluation and management by telemedicine and the availability of in person appointments. The patient expressed understanding and agreed to proceed. Staff also discussed with the patient that there may be a patient responsible charge related to this service. [place]  Provider Location: [facility]   HPI  HTN: no chest pain, no SOB , he denies dizziness or palpitation He was unable to check his bp at home. He is compliant with medication. Continue medications   ADD: patient is doing well on medication, he denies side effectsand states medication helps with focus.Marland Valdez He takes second dose in the afternoon and is able to stay focused until 6 pm. No problems following asleep at night. I will send refill to pharmacy   Dyslipidemia: he isnottaking statin therapy, he had lipid panel in March that showed improvement of LDL and liver enzymes were high, but he is afraid of going to [place] during pandemic   Elevated liver enzymes:  he was supposed to have repeat labsin June, but he did not go to have it done yet. AST was 215 and ALT was 190, explained importance of rechecking it. He denies nausea, vomiting, jaundice, abdominal pain, change in appetite, he states weight has been stable since last visit   Dysthymia: he is back to baseline, he went to [place] but no longer going, adjusted to the changes and is doing well.   Patient Active Problem List   Diagnosis Date Noted  . Internal hemorrhoids 10/23/2016  . Diverticulosis  10/23/2016  . ADD (attention deficit disorder) 11/13/2014  . Basal cell carcinoma of face 11/13/2014  . CD (contact dermatitis) 11/13/2014  . Dyslipidemia 11/13/2014  . Blood glucose elevated 11/13/2014  . Benign hypertension 11/13/2014  . Perennial allergic rhinitis with seasonal variation 11/13/2014  . Allergic rhinitis 11/13/2014    Past Surgical History:  Procedure Laterality Date  . COLONOSCOPY WITH PROPOFOL N/A 10/08/2016   Procedure: COLONOSCOPY WITH PROPOFOL;  Surgeon: Lollie Sails, MD;  Location: [facility] ENDOSCOPY;  Service: Endoscopy;  Laterality: N/A;  . EYE SURGERY Bilateral    lasik  . LASIK Bilateral 12.30.2015    Family History  Problem Relation Age of Onset  . Hypertension Mother   . Cancer Father 44       prostate cancer  . Hypertension Sister   . Obesity Sister     Social History   Tobacco Use  . Smoking status: Never Smoker  . Smokeless tobacco: Former Network engineer Use Topics  . Alcohol use: Yes    Alcohol/week: 0.0 standard drinks    Current Outpatient Medications:  .  amphetamine-dextroamphetamine (ADDERALL) 10 MG tablet, 20 mg in am and 10 mg in the pm, Disp: 90 tablet, Rfl: 0 .  amphetamine-dextroamphetamine (ADDERALL) 10 MG tablet, 20 mg in am and 10 mg in the pm, Disp: 90 tablet, Rfl: 0 .  amphetamine-dextroamphetamine (ADDERALL) 10 MG tablet, 20 mg in am and 10 mg in the pm, Disp: 90 tablet, Rfl: 0 .  atenolol (TENORMIN) 25 MG tablet, TAKE 1 TABLET BY MOUTH IN  THE EVENING, Disp: 90 tablet, Rfl: 1 .  cetirizine (ZYRTEC) 10 MG tablet, Take 1 tablet by mouth daily., Disp: , Rfl:  .  Olmesartan-amLODIPine-HCTZ 20-5-12.5 MG TABS, Take 1 tablet by mouth daily., Disp: 90 tablet, Rfl: 4 .  triamcinolone cream (KENALOG) 0.1 %, APPLY TWICE A DAY AS DIRECTED, Disp: 454 g, Rfl: 0 .  Vitamin D, Ergocalciferol, (DRISDOL) 1.25 MG (50000 UT) CAPS capsule, Take 1 capsule (50,000 Units total) by mouth every 7 (seven) days., Disp: 12 capsule, Rfl:  0  Allergies  Allergen Reactions  . Aspirin Anaphylaxis  . Nsaids   . Penicillins     I personally reviewed active problem list, medication list, allergies, family history, social history, health maintenance with the patient/caregiver today.   ROS  Ten systems reviewed and is negative except as mentioned in HPI   Objective  Virtual encounter, vitals not obtained.  There is no height or weight on file to calculate BMI.  Physical Exam  Awake, alert and oriented in no acute distress   PHQ2/9: Depression screen [facility] 2/9 07/01/2019 03/26/2019 01/06/2019 10/08/2018 07/10/2018  Decreased Interest 0 0 0 0 2  Down, Depressed, Hopeless 0 0 0 0 1  PHQ - 2 Score 0 0 0 0 3  Altered sleeping 0 0 3 0 1  Tired, decreased energy 0 0 0 0 2  Change in appetite 0 0 0 0 2  Feeling bad or failure about yourself  0 0 0 0 0  Trouble concentrating 0 0 0 1 1  Moving slowly or fidgety/restless 0 0 0 0 0  Suicidal thoughts 0 0 0 0 0  PHQ-9 Score 0 0 3 1 9   Difficult doing work/chores - - Not difficult at all Not difficult at all Somewhat difficult  Some recent data might be hidden   PHQ-2/9 Result is negative.    Fall Risk: Fall Risk  07/01/2019 03/26/2019 01/06/2019 07/10/2018 04/11/2018  Falls in the past year? 0 0 0 0 0  Number falls in past yr: 0 0 0 0 0  Injury with Fall? 0 0 0 0 0     Assessment & Plan  1. Attention deficit hyperactivity disorder (ADHD), predominantly hyperactive type  Doing well, continue medication  - amphetamine-dextroamphetamine (ADDERALL) 10 MG tablet; 20 mg in am and 10 mg in the pm  Dispense: 90 tablet; Refill: 0 - amphetamine-dextroamphetamine (ADDERALL) 10 MG tablet; 20 mg in am and 10 mg in the pm  Dispense: 90 tablet; Refill: 0 - amphetamine-dextroamphetamine (ADDERALL) 10 MG tablet; 20 mg in am and 10 mg in the pm  Dispense: 90 tablet; Refill: 0  2. Dyslipidemia   3. Vitamin D deficiency  Continue otc supplementation   4. Benign hypertension  Asked him  again to check bp at home   5. Dysthymia  Doing well now  6. Elevated liver function tests  Explained importance of having labs done, but he is afraid to go to [facility] until vaccinated  I discussed the assessment and treatment plan with the patient. The patient was provided an opportunity to ask questions and all were answered. The patient agreed with the plan and demonstrated an understanding of the instructions.  The patient was advised to call back or seek an in-person evaluation if the symptoms worsen or if the condition fails to improve as anticipated.  I provided 25  minutes of non-face-to-face time during this encounter.

## 2019-07-13 ENCOUNTER — Other Ambulatory Visit: Payer: Self-pay | Admitting: Family Medicine

## 2019-07-13 DIAGNOSIS — R7989 Other specified abnormal findings of blood chemistry: Secondary | ICD-10-CM

## 2019-09-30 ENCOUNTER — Other Ambulatory Visit: Payer: Self-pay

## 2019-09-30 ENCOUNTER — Encounter: Payer: Self-pay | Admitting: Family Medicine

## 2019-09-30 ENCOUNTER — Ambulatory Visit: Payer: 59 | Admitting: Family Medicine

## 2019-09-30 VITALS — BP 110/80 | HR 90 | Temp 97.3°F | Resp 16 | Ht 70.0 in | Wt 199.1 lb

## 2019-09-30 DIAGNOSIS — R739 Hyperglycemia, unspecified: Secondary | ICD-10-CM

## 2019-09-30 DIAGNOSIS — I10 Essential (primary) hypertension: Secondary | ICD-10-CM

## 2019-09-30 DIAGNOSIS — F901 Attention-deficit hyperactivity disorder, predominantly hyperactive type: Secondary | ICD-10-CM

## 2019-09-30 DIAGNOSIS — E785 Hyperlipidemia, unspecified: Secondary | ICD-10-CM | POA: Diagnosis not present

## 2019-09-30 DIAGNOSIS — R7989 Other specified abnormal findings of blood chemistry: Secondary | ICD-10-CM

## 2019-09-30 DIAGNOSIS — E559 Vitamin D deficiency, unspecified: Secondary | ICD-10-CM | POA: Diagnosis not present

## 2019-09-30 MED ORDER — ATENOLOL 25 MG PO TABS: 25.0000 mg | ORAL_TABLET | Freq: Every evening | ORAL | 1 refills | Status: DC

## 2019-09-30 MED ORDER — AMPHETAMINE-DEXTROAMPHETAMINE 10 MG PO TABS: ORAL_TABLET | ORAL | 0 refills | Status: DC

## 2019-09-30 MED ORDER — VITAMIN D (ERGOCALCIFEROL) 1.25 MG (50000 UNIT) PO CAPS: 50000.0000 [IU] | ORAL_CAPSULE | ORAL | 1 refills | Status: DC

## 2019-09-30 NOTE — Progress Notes (Signed)
Name: Victor Valdez   MRN: [id number]    DOB: 08/02/1965   Date:09/30/2019       Progress Note  Subjective  Chief Complaint  Chief Complaint  Patient presents with  . Hypertension  . Dyslipidemia  . ADD    HPI  HTN: no chest pain, no SOB , he denies dizziness or palpitation He was unable to check his bp at home. He is compliant with medication.Continue medications  ADD: patient is doing well on medication, he denies side effectsand states medication helps with focus.[name] He takes second dose in the afternoon and is able to stay focused until 6 pm. No problems following asleep at night. I will send refill to pharmacy   Dyslipidemia: he isnottaking statin therapy, he had lipid panel in March that showed improvement of LDL and liver enzymes were high, but he is afraid of going to [place] during pandemic   Elevated liver enzymes:he was supposed to have repeat labsin June, but he did not go to have it done yet. AST was 215 and ALT was 190, explained importance of rechecking it. He denies nausea, vomiting, jaundice, abdominal pain, change in appetite, he states weight has been stable since last visit   Dysthymia: he is back to baseline, he went to [place] but no longer going, adjusted to the changes and is doing well.    Patient Active Problem List   Diagnosis Date Noted  . Internal hemorrhoids 10/23/2016  . Diverticulosis 10/23/2016  . ADD (attention deficit disorder) 11/13/2014  . Basal cell carcinoma of face 11/13/2014  . CD (contact dermatitis) 11/13/2014  . Dyslipidemia 11/13/2014  . Blood glucose elevated 11/13/2014  . Benign hypertension 11/13/2014  . Perennial allergic rhinitis with seasonal variation 11/13/2014  . Allergic rhinitis 11/13/2014    Past Surgical History:  Procedure Laterality Date  . COLONOSCOPY WITH PROPOFOL N/A 10/08/2016   Procedure: COLONOSCOPY WITH PROPOFOL;  Surgeon: Lollie Sails, MD;  Location: [facility] ENDOSCOPY;  Service: Endoscopy;   Laterality: N/A;  . EYE SURGERY Bilateral    lasik  . LASIK Bilateral 12.30.2015    Family History  Problem Relation Age of Onset  . Hypertension Mother   . Cancer Father 20       prostate cancer  . Hypertension Sister   . Obesity Sister     Social History   Tobacco Use  . Smoking status: Never Smoker  . Smokeless tobacco: Former Network engineer Use Topics  . Alcohol use: Yes    Alcohol/week: 0.0 standard drinks     Current Outpatient Medications:  .  amphetamine-dextroamphetamine (ADDERALL) 10 MG tablet, 20 mg in am and 10 mg in the pm, Disp: 90 tablet, Rfl: 0 .  amphetamine-dextroamphetamine (ADDERALL) 10 MG tablet, 20 mg in am and 10 mg in the pm, Disp: 90 tablet, Rfl: 0 .  amphetamine-dextroamphetamine (ADDERALL) 10 MG tablet, 20 mg in am and 10 mg in the pm, Disp: 90 tablet, Rfl: 0 .  atenolol (TENORMIN) 25 MG tablet, TAKE 1 TABLET BY MOUTH IN  THE EVENING, Disp: 90 tablet, Rfl: 1 .  cetirizine (ZYRTEC) 10 MG tablet, Take 1 tablet by mouth daily., Disp: , Rfl:  .  Olmesartan-amLODIPine-HCTZ 20-5-12.5 MG TABS, Take 1 tablet by mouth daily., Disp: 90 tablet, Rfl: 4 .  triamcinolone cream (KENALOG) 0.1 %, APPLY TWICE A DAY AS DIRECTED, Disp: 454 g, Rfl: 0 .  Vitamin D, Ergocalciferol, (DRISDOL) 1.25 MG (50000 UT) CAPS capsule, Take 1 capsule (50,000 Units total) by mouth every  7 (seven) days., Disp: 12 capsule, Rfl: 0  Allergies  Allergen Reactions  . Aspirin Anaphylaxis  . Nsaids   . Penicillins     I personally reviewed active problem list, medication list, allergies, family history, social history, health maintenance with the patient/caregiver today.   ROS  Constitutional: Negative for fever or weight change.  Respiratory: Negative for cough and shortness of breath.   Cardiovascular: Negative for chest pain or palpitations.  Gastrointestinal: Negative for abdominal pain, no bowel changes.  Musculoskeletal: Negative for gait problem or joint swelling.  Skin:  Negative for rash.  Neurological: Negative for dizziness or headache.  No other specific complaints in a complete review of systems (except as listed in HPI above).  Objective  Vitals:   09/30/19 0935  BP: 110/80  Pulse: 90  Resp: 16  Temp: (!) 97.3 F (36.3 C)  TempSrc: Temporal  SpO2: 95%  Weight: 199 lb 1.6 oz (90.3 kg)  Height: 5\' 10"  (1.778 m)    Body mass index is 28.57 kg/m.  Physical Exam  Constitutional: Patient appears well-developed and well-nourished. Overweight.  No distress.  HEENT: head atraumatic, normocephalic, pupils equal and reactive to light,  neck supple, throat within normal limits Cardiovascular: Normal rate, regular rhythm and normal heart sounds.  No murmur heard. No BLE edema. Pulmonary/Chest: Effort normal and breath sounds normal. No respiratory distress. Abdominal: Soft.  There is no tenderness. Psychiatric: Patient has a normal mood and affect. behavior is normal. Judgment and thought content normal.  PHQ2/9: Depression screen [facility] 2/9 09/30/2019 07/01/2019 03/26/2019 01/06/2019 10/08/2018  Decreased Interest 0 0 0 0 0  Down, Depressed, Hopeless 0 0 0 0 0  PHQ - 2 Score 0 0 0 0 0  Altered sleeping 0 0 0 3 0  Tired, decreased energy 0 0 0 0 0  Change in appetite 0 0 0 0 0  Feeling bad or failure about yourself  0 0 0 0 0  Trouble concentrating 0 0 0 0 1  Moving slowly or fidgety/restless 0 0 0 0 0  Suicidal thoughts 0 0 0 0 0  PHQ-9 Score 0 0 0 3 1  Difficult doing work/chores - - - Not difficult at all Not difficult at all  Some recent data might be hidden    phq 9 is negative   Fall Risk: Fall Risk  09/30/2019 07/01/2019 03/26/2019 01/06/2019 07/10/2018  Falls in the past year? 0 0 0 0 0  Number falls in past yr: 0 0 0 0 0  Injury with Fall? 0 0 0 0 0     Functional Status Survey: Is the patient deaf or have difficulty hearing?: No Does the patient have difficulty seeing, even when wearing glasses/contacts?: No Does the patient have  difficulty walking or climbing stairs?: No Does the patient have difficulty dressing or bathing?: No Does the patient have difficulty doing errands alone such as visiting a doctor's office or shopping?: No    Assessment & Plan  1. Vitamin D deficiency  - VITAMIN D 25 Hydroxy (Vit-D Deficiency, Fractures) - Vitamin D, Ergocalciferol, (DRISDOL) 1.25 MG (50000 UNIT) CAPS capsule; Take 1 capsule (50,000 Units total) by mouth every 7 (seven) days.  Dispense: 12 capsule; Refill: 1  2. Attention deficit hyperactivity disorder (ADHD), predominantly hyperactive type  - amphetamine-dextroamphetamine (ADDERALL) 10 MG tablet; 20 mg in am and 10 mg in the pm  Dispense: 90 tablet; Refill: 0 - amphetamine-dextroamphetamine (ADDERALL) 10 MG tablet; 20 mg in am and 10 mg  in the pm  Dispense: 90 tablet; Refill: 0 - amphetamine-dextroamphetamine (ADDERALL) 10 MG tablet; 20 mg in am and 10 mg in the pm  Dispense: 90 tablet; Refill: 0  3. Dyslipidemia  He will have labs done at work   4. Benign hypertension  - atenolol (TENORMIN) 25 MG tablet; Take 1 tablet (25 mg total) by mouth every evening.  Dispense: 90 tablet; Refill: 1  5. Elevated LFTs   6. Hyperglycemia  Recheck labs

## 2019-10-30 ENCOUNTER — Ambulatory Visit: Payer: 59 | Admitting: Family Medicine

## 2019-10-30 ENCOUNTER — Encounter: Payer: Self-pay | Admitting: Family Medicine

## 2019-10-30 ENCOUNTER — Other Ambulatory Visit: Payer: Self-pay

## 2019-10-30 VITALS — BP 122/74 | HR 83 | Temp 98.1°F | Resp 16 | Ht 70.0 in | Wt 195.7 lb

## 2019-10-30 DIAGNOSIS — M5412 Radiculopathy, cervical region: Secondary | ICD-10-CM | POA: Diagnosis not present

## 2019-10-30 MED ORDER — METAXALONE 800 MG PO TABS: 800.0000 mg | ORAL_TABLET | Freq: Three times a day (TID) | ORAL | 0 refills | Status: DC

## 2019-10-30 MED ORDER — PREDNISONE 5 MG (48) PO TBPK: ORAL_TABLET | ORAL | 0 refills | Status: DC

## 2019-10-30 NOTE — Progress Notes (Signed)
Name: Victor Valdez   MRN: [id number]    DOB: [birth year]   Date:10/30/2019       Progress Note  Subjective  Chief Complaint  Chief Complaint  Patient presents with  . Neck Pain    injury from fall  . Numbness    right arm     HPI  Neck pain: Victor Valdez takes [employer] classes at [employer] and states he was trying to impress his wife and fell while doing a high kick, he landed on his left shoulder. He did not hit his head, he recalls the incident. He states he cannot feel comfortable since the incident on 06/16 th, 2021. He is going on vacation and is worried because he is now having some intermittent numbness on right arm. He is having difficulty sleeping at night because needs to shift his position. No weakness but arm does not feel right. No bowel or bladder incontinence.   Patient Active Problem List   Diagnosis Date Noted  . Internal hemorrhoids 10/23/2016  . Diverticulosis 10/23/2016  . ADD (attention deficit disorder) 11/13/2014  . Basal cell carcinoma of face 11/13/2014  . CD (contact dermatitis) 11/13/2014  . Dyslipidemia 11/13/2014  . Blood glucose elevated 11/13/2014  . Benign hypertension 11/13/2014  . Perennial allergic rhinitis with seasonal variation 11/13/2014  . Allergic rhinitis 11/13/2014    Past Surgical History:  Procedure Laterality Date  . COLONOSCOPY WITH PROPOFOL N/A 10/08/2016   Procedure: COLONOSCOPY WITH PROPOFOL;  Surgeon: Lollie Sails, MD;  Location: [facility] ENDOSCOPY;  Service: Endoscopy;  Laterality: N/A;  . EYE SURGERY Bilateral    lasik  . LASIK Bilateral 12.30.2015    Family History  Problem Relation Age of Onset  . Hypertension Mother   . Cancer Father 45       prostate cancer  . Hypertension Sister   . Obesity Sister     Social History   Tobacco Use  . Smoking status: Never Smoker  . Smokeless tobacco: Former Network engineer Use Topics  . Alcohol use: Yes    Alcohol/week: 0.0 standard drinks     Current  Outpatient Medications:  .  amphetamine-dextroamphetamine (ADDERALL) 10 MG tablet, 20 mg in am and 10 mg in the pm, Disp: 90 tablet, Rfl: 0 .  atenolol (TENORMIN) 25 MG tablet, Take 1 tablet (25 mg total) by mouth every evening., Disp: 90 tablet, Rfl: 1 .  cetirizine (ZYRTEC) 10 MG tablet, Take 1 tablet by mouth daily., Disp: , Rfl:  .  Olmesartan-amLODIPine-HCTZ 20-5-12.5 MG TABS, Take 1 tablet by mouth daily., Disp: 90 tablet, Rfl: 4 .  triamcinolone cream (KENALOG) 0.1 %, APPLY TWICE A DAY AS DIRECTED, Disp: 454 g, Rfl: 0 .  Vitamin D, Ergocalciferol, (DRISDOL) 1.25 MG (50000 UNIT) CAPS capsule, Take 1 capsule (50,000 Units total) by mouth every 7 (seven) days., Disp: 12 capsule, Rfl: 1 .  amphetamine-dextroamphetamine (ADDERALL) 10 MG tablet, 20 mg in am and 10 mg in the pm, Disp: 90 tablet, Rfl: 0 .  amphetamine-dextroamphetamine (ADDERALL) 10 MG tablet, 20 mg in am and 10 mg in the pm, Disp: 90 tablet, Rfl: 0  Allergies  Allergen Reactions  . Aspirin Anaphylaxis  . Nsaids   . Penicillins     I personally reviewed active problem list, medication list, allergies, family history, social history, health maintenance with the patient/caregiver today.   ROS  Constitutional: Negative for fever or weight change.  Respiratory: Negative for cough and shortness of breath.   Cardiovascular: Negative  for chest pain or palpitations.  Gastrointestinal: Negative for abdominal pain, no bowel changes.  Musculoskeletal: Negative for gait problem or joint swelling.  Skin: Negative for rash.  Neurological: Negative for dizziness or headache.  No other specific complaints in a complete review of systems (except as listed in HPI above).  Objective  Vitals:   10/30/19 1154  BP: 122/74  Pulse: 83  Resp: 16  Temp: 98.1 F (36.7 C)  TempSrc: Temporal  SpO2: 95%  Weight: 195 lb 11.2 oz (88.8 kg)  Height: 5\' 10"  (1.778 m)    Body mass index is 28.08 kg/m.  Physical Exam  Constitutional:  Patient appears well-developed and well-nourished. Overweight. No distress.  HEENT: head atraumatic, normocephalic, pupils equal and reactive to light,  neck supple Cardiovascular: Normal rate, regular rhythm and normal heart sounds.  No murmur heard. No BLE edema. Pulmonary/Chest: Effort normal and breath sounds normal. No respiratory distress. Muscular skeletal: normal shoulder exam /rom and strength. Pain during palpation of paraspinal muscle at thoracic level, decrease extension of neck, pain exacerbation by rotation of neck to left side and extension also with left lateral bending. Normal grip and arm strength Abdominal: Soft.  There is no tenderness. Psychiatric: Patient has a normal mood and affect. behavior is normal. Judgment and thought content normal . PHQ2/9: Depression screen [facility] 2/9 09/30/2019 07/01/2019 03/26/2019 01/06/2019 10/08/2018  Decreased Interest 0 0 0 0 0  Down, Depressed, Hopeless 0 0 0 0 0  PHQ - 2 Score 0 0 0 0 0  Altered sleeping 0 0 0 3 0  Tired, decreased energy 0 0 0 0 0  Change in appetite 0 0 0 0 0  Feeling bad or failure about yourself  0 0 0 0 0  Trouble concentrating 0 0 0 0 1  Moving slowly or fidgety/restless 0 0 0 0 0  Suicidal thoughts 0 0 0 0 0  PHQ-9 Score 0 0 0 3 1  Difficult doing work/chores - - - Not difficult at all Not difficult at all  Some recent data might be hidden    phq 9 is negative   Fall Risk: Fall Risk  10/30/2019 09/30/2019 07/01/2019 03/26/2019 01/06/2019  Falls in the past year? 1 0 0 0 0  Number falls in past yr: 0 0 0 0 0  Injury with Fall? 1 0 0 0 0    Functional Status Survey: Is the patient deaf or have difficulty hearing?: No Does the patient have difficulty seeing, even when wearing glasses/contacts?: No Does the patient have difficulty concentrating, remembering, or making decisions?: No Does the patient have difficulty walking or climbing stairs?: No Does the patient have difficulty dressing or bathing?: No Does the  patient have difficulty doing errands alone such as visiting a doctor's office or shopping?: No    Assessment & Plan  1. Cervical radiculitis  - metaxalone (SKELAXIN) 800 MG tablet; Take 1 tablet (800 mg total) by mouth 3 (three) times daily.  Dispense: 30 tablet; Refill: 0 - predniSONE (STERAPRED UNI-PAK 48 TAB) 5 MG (48) TBPK tablet; Take as directed Take 6 for 3 days followed by 4 for 3 days , 3 for 3 days and 2 for 3 days and 1 for 3 days with food and not after 5 pm  Dispense: 48 tablet; Refill: 0

## 2019-11-29 NOTE — Progress Notes (Signed)
Romir Klimowicz T. Aymee Fomby, MD, [place]  [facility] and [place] at [facility] [place], [address]  Phone: [phone]  FAX: [phone]  Dominick Morella - 54 y.o. male  MRN [id number]  Date of Birth: 03-26-1966  Date: 11/30/2019  PCP: Steele Sizer, MD  Referral: Steele Sizer, MD  Chief Complaint  Patient presents with  . Neck Pain    This visit occurred during the SARS-CoV-2 public health emergency.  Safety protocols were in place, including screening questions prior to the visit, additional usage of staff PPE, and extensive cleaning of exam room while observing appropriate contact time as indicated for disinfecting solutions.   Subjective:   Kery Batzel is a 54 y.o. very pleasant male patient with Body mass index is 29.06 kg/m. who presents with the following:  1 month ago:  Yahia is here to discuss a neck injury and some cervical radiculopathy on the right.  He does train in taekwondo, and he was doing a high kick to show his wife some of his learned skills, and at that point he did fall and injured his neck.  He did not strike his head, and aside from his neck he did not have any other major injury.  He was given a steroid taper when he saw his primary care provider Dr. Ancil Boozer, and she also gave him some Skelaxin.  He is here today and still has some ongoing symptoms. Fell backwards.  Flying sidekick and fell down on his neck.  Was a Thursday night and pain.  Not hurting any more.  Triceps ext is weak.  He is also having some referred pain to the shoulder blade on the right.  Low dose steroids by his PCP.  Basically, he does not have any pain right now, but he still is having some weakness.  The radicular symptoms are entirely gone at this point.  Prior to this, he did notice that when he slept with his arm up above his head he had relief of symptoms.  Ext rotation - some pain Tight pec minor R  triceps extension weak  Review of Systems is noted in the HPI, as appropriate   Objective:   BP 110/82   Pulse 104   Temp 98.2 F (36.8 C) (Temporal)   Ht 5\' 10"  (1.778 m)   Wt 202 lb 8 oz (91.9 kg)   SpO2 97%   BMI 29.06 kg/m    GEN: Well-developed,well-nourished,in no acute distress; alert,appropriate and cooperative throughout examination HEENT: Normocephalic and atraumatic without obvious abnormalities. Ears, externally no deformities PULM: Breathing comfortably in no respiratory distress EXT: No clubbing, cyanosis, or edema PSYCH: Normally interactive. Cooperative during the interview. Pleasant. Friendly and conversant. Not anxious or depressed appearing. Normal, full affect.  CERVICAL SPINE EXAM Range of motion: Flexion, extension, lateral bending, and rotation: Full range of motion with some very modest loss of motion Pain with terminal motion: Minor Spinous Processes: NT SCM: NT Upper paracervical muscles: He does have some pain in his distal trap and upper rhomboid Upper traps: NT  Sensation, C5-T1 is entirely intact  From a motor standpoint the patient does have decreased strength with triceps extension, and all other strength is 5/5. From a tricep standpoint he does have 3+/5 strength on the right  Bilateral shoulders with full range of motion in all directions.  Strength at the shoulder is 5/5 in all directions.  All rotator cuff testing is full with 5/5 strength.  Nontender at the clavicle, AC joint as well as in the scapula to deep compression.  Nontender at the humerus.  Range of motion at the elbow is full and does not cause any specific pain.  Grip is intact.  Radiology: No results found.  Assessment and Plan:     ICD-10-CM   1. Cervical radiculopathy, acute  M54.12 DG Cervical Spine Complete  2. Cervicalgia  M54.2 DG Cervical Spine Complete  3. Right arm weakness  R29.898    Level of Medical Decision-Making in this case is MODERATE.   He does  have some weakness with triceps extension, more likely C7/C8 involvement.  Overall his pain is basically entirely gone and his sensation is intact on exam.   The predominant area of concern is at his triceps weakness. Clinically consistent with disc herniation, uncertain prognosis, but hopefully will improve with steroids, time, and rehab.  14 days of steroids.  I gave him a comprehensive neck rehabilitation program including McKenzie protocol.  Hopefully he will do well.  If he is not improving in 2 weeks, I will send him for formal physical therapy including traction.  All this was reviewed with him in full.  At this point, he did not have enough time to get his C-spine x-ray, so he is planning on coming on Wednesday.  Follow-up: Return in about 5 weeks (around 01/04/2020).  Meds ordered this encounter  Medications  . predniSONE (DELTASONE) 20 MG tablet    Sig: 2 tabs po for 7 days, then 1 tab po for 7 days    Dispense:  21 tablet    Refill:  0   Medications Discontinued During This Encounter  Medication Reason  . metaxalone (SKELAXIN) 800 MG tablet Completed Course  . predniSONE (STERAPRED UNI-PAK 48 TAB) 5 MG (48) TBPK tablet Completed Course   Orders Placed This Encounter  Procedures  . DG Cervical Spine Complete    Signed,  Elsi Stelzer T. Gurdeep Keesey, MD   Outpatient Encounter Medications as of 11/30/2019  Medication Sig  . amphetamine-dextroamphetamine (ADDERALL) 10 MG tablet 20 mg in am and 10 mg in the pm  . amphetamine-dextroamphetamine (ADDERALL) 10 MG tablet 20 mg in am and 10 mg in the pm  . amphetamine-dextroamphetamine (ADDERALL) 10 MG tablet 20 mg in am and 10 mg in the pm  . atenolol (TENORMIN) 25 MG tablet Take 1 tablet (25 mg total) by mouth every evening.  . cetirizine (ZYRTEC) 10 MG tablet Take 1 tablet by mouth daily.  . Olmesartan-amLODIPine-HCTZ 20-5-12.5 MG TABS Take 1 tablet by mouth daily.  . predniSONE (DELTASONE) 20 MG tablet 2 tabs po for 7 days, then 1  tab po for 7 days  . triamcinolone cream (KENALOG) 0.1 % APPLY TWICE A DAY AS DIRECTED  . Vitamin D, Ergocalciferol, (DRISDOL) 1.25 MG (50000 UNIT) CAPS capsule Take 1 capsule (50,000 Units total) by mouth every 7 (seven) days.  . [DISCONTINUED] metaxalone (SKELAXIN) 800 MG tablet Take 1 tablet (800 mg total) by mouth 3 (three) times daily.  . [DISCONTINUED] predniSONE (STERAPRED UNI-PAK 48 TAB) 5 MG (48) TBPK tablet Take as directed Take 6 for 3 days followed by 4 for 3 days , 3 for 3 days and 2 for 3 days and 1 for 3 days with food and not after 5 pm   No facility-administered encounter medications on file as of 11/30/2019.

## 2019-11-30 ENCOUNTER — Encounter: Payer: Self-pay | Admitting: Family Medicine

## 2019-11-30 ENCOUNTER — Other Ambulatory Visit: Payer: Self-pay

## 2019-11-30 ENCOUNTER — Ambulatory Visit: Payer: 59 | Admitting: Family Medicine

## 2019-11-30 VITALS — BP 110/82 | HR 104 | Temp 98.2°F | Ht 70.0 in | Wt 202.5 lb

## 2019-11-30 DIAGNOSIS — M5412 Radiculopathy, cervical region: Secondary | ICD-10-CM

## 2019-11-30 DIAGNOSIS — M542 Cervicalgia: Secondary | ICD-10-CM | POA: Diagnosis not present

## 2019-11-30 DIAGNOSIS — R29898 Other symptoms and signs involving the musculoskeletal system: Secondary | ICD-10-CM

## 2019-11-30 MED ORDER — PREDNISONE 20 MG PO TABS
ORAL_TABLET | ORAL | 0 refills | Status: DC
Start: 2019-11-30 — End: 2020-04-04

## 2019-12-01 ENCOUNTER — Other Ambulatory Visit: Payer: Self-pay | Admitting: Family Medicine

## 2019-12-01 DIAGNOSIS — E559 Vitamin D deficiency, unspecified: Secondary | ICD-10-CM

## 2019-12-23 ENCOUNTER — Other Ambulatory Visit: Payer: Self-pay

## 2019-12-23 ENCOUNTER — Encounter: Payer: Self-pay | Admitting: Family Medicine

## 2019-12-23 ENCOUNTER — Ambulatory Visit (INDEPENDENT_AMBULATORY_CARE_PROVIDER_SITE_OTHER)
Admission: RE | Admit: 2019-12-23 | Discharge: 2019-12-23 | Disposition: A | Discharge status: Home / Self Care | Payer: 59 | Source: Ambulatory Visit | Attending: Family Medicine | Admitting: Family Medicine

## 2019-12-23 ENCOUNTER — Ambulatory Visit (INDEPENDENT_AMBULATORY_CARE_PROVIDER_SITE_OTHER): Payer: 59 | Admitting: Family Medicine

## 2019-12-23 VITALS — BP 110/84 | HR 108 | Temp 98.1°F | Ht 70.0 in | Wt 199.0 lb

## 2019-12-23 DIAGNOSIS — M5412 Radiculopathy, cervical region: Secondary | ICD-10-CM | POA: Diagnosis not present

## 2019-12-23 DIAGNOSIS — R29898 Other symptoms and signs involving the musculoskeletal system: Secondary | ICD-10-CM

## 2019-12-23 MED ORDER — PREDNISONE 20 MG PO TABS
ORAL_TABLET | ORAL | 0 refills | Status: DC
Start: 2019-12-23 — End: 2019-12-31

## 2019-12-23 NOTE — Progress Notes (Signed)
Coryn Mosso T. Jalena Vanderlinden, MD, [place]  Primary Care and [place] at [facility] [place], [address]  Phone: [phone]  FAX: [phone]  Victor Valdez - 54 y.o. male  MRN [id number]  Date of Birth: 08-05-1965  Date: 12/23/2019  PCP: Steele Sizer, MD  Referral: Steele Sizer, MD  Chief Complaint  Patient presents with  . Extremity Weakness    Right Arm    This visit occurred during the SARS-CoV-2 public health emergency.  Safety protocols were in place, including screening questions prior to the visit, additional usage of staff PPE, and extensive cleaning of exam room while observing appropriate contact time as indicated for disinfecting solutions.   Subjective:   Victor Valdez is a 54 y.o. very pleasant male patient with Body mass index is 28.55 kg/m. who presents with the following:  Victor Valdez is here in follow-up.  The last time I saw him he was having some acute radicular pain as well as some weakness in his right upper extremity.  He did not have any sensory changes.  I did put him on 14 days of some prednisone and had him do some McKenzie protocol exercise.  He is feeling quite a bit better now and is not having any pain.  He does notice he has some weakness and his tricep movements at the gym.  He has been working on this at [employer] and with a Clinical research associate.  R UE is better. Pain but still with the strengh change.   Review of Systems is noted in the HPI, as appropriate   Objective:   BP 110/84   Pulse (!) 108   Temp 98.1 F (36.7 C) (Temporal)   Ht 5\' 10"  (1.778 m)   Wt 199 lb (90.3 kg)   SpO2 98%   BMI 28.55 kg/m    GEN: Well-developed,well-nourished,in no acute distress; alert,appropriate and cooperative throughout examination HEENT: Normocephalic and atraumatic without obvious abnormalities. Ears, externally no deformities PULM: Breathing comfortably in no respiratory distress EXT: No  clubbing, cyanosis, or edema PSYCH: Normally interactive. Cooperative during the interview. Pleasant. Friendly and conversant. Not anxious or depressed appearing. Normal, full affect.  CERVICAL SPINE EXAM Range of motion: Flexion, extension, lateral bending, and rotation: Full Pain with terminal motion: Minimal Spinous Processes: NT SCM: NT Upper paracervical muscles: Minimal Upper traps: NT C5-T1 :sensation and motor sensation is intact throughout, but triceps extension is 4/5  Radiology: No results found.  Assessment and Plan:     ICD-10-CM   1. Cervical radiculopathy, acute  M54.12 DG Cervical Spine Complete  2. Right arm weakness  R29.898 DG Cervical Spine Complete   Certainly improving compared to last time.  He does have some persistent weakness with with tricep extension.  This is improved however compared to last time I saw him.  Overall his strength is better.  Get a plain film today.  Continue to work on home rehab, prednisone x10 days.  Follow-up: Return in about 1 month (around 01/23/2020).  Meds ordered this encounter  Medications  . predniSONE (DELTASONE) 20 MG tablet    Sig: 2 tabs po daily for 5 days, then 1 tab po daily for 5 days    Dispense:  15 tablet    Refill:  0   There are no discontinued medications. Orders Placed This Encounter  Procedures  . DG Cervical Spine Complete    Signed,  Johneric Mcfadden T. Rachella Basden, MD   Outpatient Encounter Medications as of  12/23/2019  Medication Sig  . amphetamine-dextroamphetamine (ADDERALL) 10 MG tablet 20 mg in am and 10 mg in the pm  . amphetamine-dextroamphetamine (ADDERALL) 10 MG tablet 20 mg in am and 10 mg in the pm  . amphetamine-dextroamphetamine (ADDERALL) 10 MG tablet 20 mg in am and 10 mg in the pm  . atenolol (TENORMIN) 25 MG tablet Take 1 tablet (25 mg total) by mouth every evening.  . cetirizine (ZYRTEC) 10 MG tablet Take 1 tablet by mouth daily.  . Olmesartan-amLODIPine-HCTZ 20-5-12.5 MG TABS Take 1  tablet by mouth daily.  . predniSONE (DELTASONE) 20 MG tablet 2 tabs po for 7 days, then 1 tab po for 7 days  . triamcinolone cream (KENALOG) 0.1 % APPLY TWICE A DAY AS DIRECTED  . Vitamin D, Ergocalciferol, (DRISDOL) 1.25 MG (50000 UNIT) CAPS capsule Take 1 capsule (50,000 Units total) by mouth every 7 (seven) days.  . predniSONE (DELTASONE) 20 MG tablet 2 tabs po daily for 5 days, then 1 tab po daily for 5 days   No facility-administered encounter medications on file as of 12/23/2019.

## 2019-12-30 NOTE — Progress Notes (Signed)
Name: Victor Valdez   MRN: [id number]    DOB: 1965-10-31   Date:12/31/2019       Progress Note  Subjective  Chief Complaint  Chief Complaint  Patient presents with  . ADHD  . Hypertension  . Dyslipidemia    HPI  HTN: no chest pain, no SOB, he denies dizziness or palpitationHe is compliant with medication.BP is at goal   ADD: patient is doing well on medication, he denies side effectsand states medication helps with focus.[name] He takes second dose in the afternoon and is able to stay focused until 6 pm, working from home has also helped.  No problems following asleep at night, normal appetite   Dyslipidemia: he isnottaking statin therapy, he had lipid panel in March that showed improvement of LDL and liver enzymes were high, he is going to have labs through work tomorrow.   Elevated liver enzymes:he was supposed to have repeat labsin June, but he did not go to have it done yet. AST was 215 and ALT was 190, he states he will have labs done at work tomorrow.   Cervical radiculitis: secondary to injury during [employer] , back in June 2021, currently seeing Dr. Lorelei Pont sports medicine doctor at [facility], last visit 12/23/2019 and is still taking prednisone, he is starting to feel better. He no longer has pain, he still has some right upper extremity weakness/numbness but is getting better.   Patient Active Problem List   Diagnosis Date Noted  . Internal hemorrhoids 10/23/2016  . Diverticulosis 10/23/2016  . ADD (attention deficit disorder) 11/13/2014  . Basal cell carcinoma of face 11/13/2014  . CD (contact dermatitis) 11/13/2014  . Dyslipidemia 11/13/2014  . Blood glucose elevated 11/13/2014  . Benign hypertension 11/13/2014  . Perennial allergic rhinitis with seasonal variation 11/13/2014  . Allergic rhinitis 11/13/2014    Past Surgical History:  Procedure Laterality Date  . COLONOSCOPY WITH PROPOFOL N/A 10/08/2016   Procedure: COLONOSCOPY WITH  PROPOFOL;  Surgeon: Lollie Sails, MD;  Location[facility] ENDOSCOPY;  Service: Endoscopy;  Laterality: N/A;  . EYE SURGERY Bilateral    lasik  . LASIK Bilateral 12.30.2015    Family History  Problem Relation Age of Onset  . Hypertension Mother   . Cancer Father 53       prostate cancer  . Hypertension Sister   . Obesity Sister     Social History   Tobacco Use  . Smoking status: Never Smoker  . Smokeless tobacco: Former Network engineer Use Topics  . Alcohol use: Yes    Alcohol/week: 0.0 standard drinks     Current Outpatient Medications:  .  amphetamine-dextroamphetamine (ADDERALL) 10 MG tablet, 20 mg in am and 10 mg in the pm, Disp: 90 tablet, Rfl: 0 .  atenolol (TENORMIN) 25 MG tablet, Take 1 tablet (25 mg total) by mouth every evening., Disp: 90 tablet, Rfl: 1 .  Olmesartan-amLODIPine-HCTZ 20-5-12.5 MG TABS, Take 1 tablet by mouth daily., Disp: 90 tablet, Rfl: 4 .  predniSONE (DELTASONE) 20 MG tablet, 2 tabs po for 7 days, then 1 tab po for 7 days, Disp: 21 tablet, Rfl: 0 .  triamcinolone cream (KENALOG) 0.1 %, APPLY TWICE A DAY AS DIRECTED, Disp: 454 g, Rfl: 0 .  Vitamin D, Ergocalciferol, (DRISDOL) 1.25 MG (50000 UNIT) CAPS capsule, Take 1 capsule (50,000 Units total) by mouth every 7 (seven) days., Disp: 12 capsule, Rfl: 1 .  amphetamine-dextroamphetamine (ADDERALL) 10 MG tablet, 20 mg in am and 10 mg  in the pm, Disp: 90 tablet, Rfl: 0 .  amphetamine-dextroamphetamine (ADDERALL) 10 MG tablet, 20 mg in am and 10 mg in the pm, Disp: 90 tablet, Rfl: 0 .  cetirizine (ZYRTEC) 10 MG tablet, Take 1 tablet by mouth daily. (Patient not taking: Reported on 12/31/2019), Disp: , Rfl:  .  predniSONE (DELTASONE) 20 MG tablet, 2 tabs po daily for 5 days, then 1 tab po daily for 5 days (Patient not taking: Reported on 12/31/2019), Disp: 15 tablet, Rfl: 0  Allergies  Allergen Reactions  . Aspirin Anaphylaxis  . Nsaids   . Penicillins     I personally reviewed active problem list,  medication list, allergies, family history, social history, health maintenance with the patient/caregiver today.   ROS  Constitutional: Negative for fever or weight change.  Respiratory: Negative for cough and shortness of breath.   Cardiovascular: Negative for chest pain or palpitations.  Gastrointestinal: Negative for abdominal pain, no bowel changes.  Musculoskeletal: Negative for gait problem or joint swelling.  Skin: Negative for rash.  Neurological: Negative for dizziness or headache.  No other specific complaints in a complete review of systems (except as listed in HPI above).  Objective  Vitals:   12/31/19 0846  BP: 118/80  Pulse: 100  Resp: 16  Temp: 98.5 F (36.9 C)  TempSrc: Oral  SpO2: 96%  Weight: 198 lb 4.8 oz (89.9 kg)  Height: 5\' 10"  (1.778 m)    Body mass index is 28.45 kg/m.  Physical Exam  Constitutional: Patient appears well-developed and well-nourished. Overweight.  No distress.  HEENT: head atraumatic, normocephalic, pupils equal and reactive to light,  neck supple Cardiovascular: Normal rate, regular rhythm and normal heart sounds.  No murmur heard. No BLE edema. Pulmonary/Chest: Effort normal and breath sounds normal. No respiratory distress. Abdominal: Soft.  There is no tenderness. Muscular Skeletal: normal rom of neck  Psychiatric: Patient has a normal mood and affect. behavior is normal. Judgment and thought content normal.  PHQ2/9: Depression screen [facility] 2/9 12/31/2019 09/30/2019 07/01/2019 03/26/2019 01/06/2019  Decreased Interest 0 0 0 0 0  Down, Depressed, Hopeless 0 0 0 0 0  PHQ - 2 Score 0 0 0 0 0  Altered sleeping 0 0 0 0 3  Tired, decreased energy 0 0 0 0 0  Change in appetite 0 0 0 0 0  Feeling bad or failure about yourself  0 0 0 0 0  Trouble concentrating 0 0 0 0 0  Moving slowly or fidgety/restless 0 0 0 0 0  Suicidal thoughts 0 0 0 0 0  PHQ-9 Score 0 0 0 0 3  Difficult doing work/chores - - - - Not difficult at all  Some  recent data might be hidden    phq 9 is negative   Fall Risk: Fall Risk  12/31/2019 10/30/2019 09/30/2019 07/01/2019 03/26/2019  Falls in the past year? 0 1 0 0 0  Number falls in past yr: 0 0 0 0 0  Injury with Fall? 0 1 0 0 0     Functional Status Survey: Is the patient deaf or have difficulty hearing?: No Does the patient have difficulty seeing, even when wearing glasses/contacts?: No Does the patient have difficulty concentrating, remembering, or making decisions?: No Does the patient have difficulty walking or climbing stairs?: No Does the patient have difficulty dressing or bathing?: No Does the patient have difficulty doing errands alone such as visiting a doctor's office or shopping?: No    Assessment & Plan  1. Benign hypertension  - Olmesartan-amLODIPine-HCTZ 20-5-12.5 MG TABS; Take 1 tablet by mouth daily.  Dispense: 90 tablet; Refill: 4 - atenolol (TENORMIN) 25 MG tablet; Take 1 tablet (25 mg total) by mouth every evening.  Dispense: 90 tablet; Refill: 1  2. Vitamin D deficiency   3. Needs flu shot  - Flu Vaccine QUAD 36+ mos IM  4. Need for Tdap vaccination  - Tdap vaccine greater than or equal to 7yo IM  5. Attention deficit hyperactivity disorder (ADHD), predominantly hyperactive type  - amphetamine-dextroamphetamine (ADDERALL) 10 MG tablet; 20 mg in am and 10 mg in the pm  Dispense: 90 tablet; Refill: 0 - amphetamine-dextroamphetamine (ADDERALL) 10 MG tablet; 20 mg in am and 10 mg in the pm  Dispense: 90 tablet; Refill: 0 - amphetamine-dextroamphetamine (ADDERALL) 10 MG tablet; 20 mg in am and 10 mg in the pm  Dispense: 90 tablet; Refill: 0

## 2019-12-31 ENCOUNTER — Ambulatory Visit (INDEPENDENT_AMBULATORY_CARE_PROVIDER_SITE_OTHER): Payer: 59 | Admitting: Family Medicine

## 2019-12-31 ENCOUNTER — Encounter: Payer: Self-pay | Admitting: Family Medicine

## 2019-12-31 ENCOUNTER — Other Ambulatory Visit: Payer: Self-pay

## 2019-12-31 VITALS — BP 118/80 | HR 100 | Temp 98.5°F | Resp 16 | Ht 70.0 in | Wt 198.3 lb

## 2019-12-31 DIAGNOSIS — F901 Attention-deficit hyperactivity disorder, predominantly hyperactive type: Secondary | ICD-10-CM | POA: Diagnosis not present

## 2019-12-31 DIAGNOSIS — E559 Vitamin D deficiency, unspecified: Secondary | ICD-10-CM | POA: Diagnosis not present

## 2019-12-31 DIAGNOSIS — Z23 Encounter for immunization: Secondary | ICD-10-CM | POA: Diagnosis not present

## 2019-12-31 DIAGNOSIS — I10 Essential (primary) hypertension: Secondary | ICD-10-CM

## 2019-12-31 MED ORDER — AMPHETAMINE-DEXTROAMPHETAMINE 10 MG PO TABS: ORAL_TABLET | ORAL | 0 refills | Status: DC

## 2019-12-31 MED ORDER — ATENOLOL 25 MG PO TABS: 25.0000 mg | ORAL_TABLET | Freq: Every evening | ORAL | 1 refills | Status: DC

## 2019-12-31 MED ORDER — OLMESARTAN-AMLODIPINE-HCTZ 20-5-12.5 MG PO TABS: 1.0000 | ORAL_TABLET | Freq: Every day | ORAL | 4 refills | Status: DC

## 2020-01-04 ENCOUNTER — Ambulatory Visit: Payer: 59 | Admitting: Family Medicine

## 2020-01-25 ENCOUNTER — Ambulatory Visit: Payer: 59 | Admitting: Family Medicine

## 2020-01-25 DIAGNOSIS — Z0289 Encounter for other administrative examinations: Secondary | ICD-10-CM

## 2020-02-16 ENCOUNTER — Other Ambulatory Visit: Payer: Self-pay | Admitting: Dermatology

## 2020-02-16 ENCOUNTER — Ambulatory Visit: Payer: 59 | Admitting: Dermatology

## 2020-02-16 ENCOUNTER — Other Ambulatory Visit: Payer: Self-pay

## 2020-02-16 DIAGNOSIS — D229 Melanocytic nevi, unspecified: Secondary | ICD-10-CM

## 2020-02-16 DIAGNOSIS — L821 Other seborrheic keratosis: Secondary | ICD-10-CM

## 2020-02-16 DIAGNOSIS — B079 Viral wart, unspecified: Secondary | ICD-10-CM

## 2020-02-16 DIAGNOSIS — Z85828 Personal history of other malignant neoplasm of skin: Secondary | ICD-10-CM | POA: Diagnosis not present

## 2020-02-16 DIAGNOSIS — L578 Other skin changes due to chronic exposure to nonionizing radiation: Secondary | ICD-10-CM

## 2020-02-16 DIAGNOSIS — L820 Inflamed seborrheic keratosis: Secondary | ICD-10-CM

## 2020-02-16 DIAGNOSIS — Z1283 Encounter for screening for malignant neoplasm of skin: Secondary | ICD-10-CM | POA: Diagnosis not present

## 2020-02-16 DIAGNOSIS — D1800 Hemangioma unspecified site: Secondary | ICD-10-CM

## 2020-02-16 DIAGNOSIS — L700 Acne vulgaris: Secondary | ICD-10-CM | POA: Diagnosis not present

## 2020-02-16 DIAGNOSIS — D485 Neoplasm of uncertain behavior of skin: Secondary | ICD-10-CM | POA: Diagnosis not present

## 2020-02-16 DIAGNOSIS — L814 Other melanin hyperpigmentation: Secondary | ICD-10-CM

## 2020-02-16 MED ORDER — CLINDAMYCIN PHOSPHATE 1 % EX LOTN: TOPICAL_LOTION | CUTANEOUS | 3 refills | Status: AC

## 2020-02-16 NOTE — Patient Instructions (Addendum)
Recommend using Cln Acne Wash daily, leave on for 1-2 minutes before rinsing off. This can be purchased at [facility] or online. Wound Care Instructions  1. Cleanse wound gently with soap and water once a day then pat dry with clean gauze. Apply a thing coat of Petrolatum (petroleum jelly, "Vaseline") over the wound (unless you have an allergy to this). We recommend that you use a new, sterile tube of Vaseline. Do not pick or remove scabs. Do not remove the yellow or white "healing tissue" from the base of the wound.  2. Cover the wound with fresh, clean, nonstick gauze and secure with paper tape. You may use Band-Aids in place of gauze and tape if the would is small enough, but would recommend trimming much of the tape off as there is often too much. Sometimes Band-Aids can irritate the skin.  3. You should call the office for your biopsy report after 1 week if you have not already been contacted.  4. If you experience any problems, such as abnormal amounts of bleeding, swelling, significant bruising, significant pain, or evidence of infection, please call the office immediately.  5. FOR ADULT SURGERY PATIENTS: If you need something for pain relief you may take 1 extra strength Tylenol (acetaminophen) AND 2 Ibuprofen (200mg each) together every 4 hours as needed for pain. (do not take these if you are allergic to them or if you have a reason you should not take them.) Typically, you may only need pain medication for 1 to 3 days.     

## 2020-02-16 NOTE — Progress Notes (Signed)
Follow-Up Visit   Subjective  Victor Valdez is a 54 y.o. male who presents for the following: Warts (right idex finger - was bigger but seems almost gone now) and Annual Exam (History of BCC - TBSE today). The patient presents for Total-Body Skin Exam (TBSE) for skin cancer screening and mole check.  The following portions of the chart were reviewed this encounter and updated as appropriate:  Tobacco  Allergies  Meds  Problems  Med Hx  Surg Hx  Fam Hx     Review of Systems:  No other skin or systemic complaints except as noted in HPI or Assessment and Plan.  Objective  Well appearing patient in no apparent distress; mood and affect are within normal limits.  A full examination was performed including scalp, head, eyes, ears, nose, lips, neck, chest, axillae, abdomen, back, buttocks, bilateral upper extremities, bilateral lower extremities, hands, feet, fingers, toes, fingernails, and toenails. All findings within normal limits unless otherwise noted below.  Objective  Left cheek: Erythematous keratotic or waxy stuck-on papule or plaque.   Objective  Chin, chest: Papules of chin and chest  Objective  Right Upper Eyelid: Brown macule with 3 mm papule  Objective  Right 2nd Finger Tip: Verrucous papules -- Discussed viral etiology and contagion.    Assessment & Plan    Lentigines - Scattered tan macules - Discussed due to sun exposure - Benign, observe - Call for any changes  Seborrheic Keratoses - Stuck-on, waxy, tan-brown papules and plaques  - Discussed benign etiology and prognosis. - Observe - Call for any changes  Melanocytic Nevi - Tan-brown and/or pink-flesh-colored symmetric macules and papules - Benign appearing on exam today - Observation - Call clinic for new or changing moles - Recommend daily use of broad spectrum spf 30+ sunscreen to sun-exposed areas.   Hemangiomas - Red papules - Discussed benign nature - Observe - Call for any  changes  Actinic Damage - diffuse scaly erythematous macules with underlying dyspigmentation - Recommend daily broad spectrum sunscreen SPF 30+ to sun-exposed areas, reapply every 2 hours as needed.  - Call for new or changing lesions.  Skin cancer screening performed today.  History of Basal Cell Carcinoma of the Skin - No evidence of recurrence today - Recommend regular full body skin exams - Recommend daily broad spectrum sunscreen SPF 30+ to sun-exposed areas, reapply every 2 hours as needed.  - Call if any new or changing lesions are noted between office visits   Inflamed seborrheic keratosis Left cheek  Destruction of lesion - Left cheek Complexity: simple   Destruction method: cryotherapy   Informed consent: discussed and consent obtained   Timeout:  patient name, date of birth, surgical site, and procedure verified Lesion destroyed using liquid nitrogen: Yes   Region frozen until ice ball extended beyond lesion: Yes   Outcome: patient tolerated procedure well with no complications   Post-procedure details: wound care instructions given    Acne vulgaris Chin, chest  Recommend Cln acne wash daily  Start Clindamycin lotion qd-bid  clindamycin (CLEOCIN-T) 1 % lotion - Chin, chest  Neoplasm of uncertain behavior of skin Right Upper Eyelid  Skin / nail biopsy Type of biopsy: tangential   Informed consent: discussed and consent obtained   Timeout: patient name, date of birth, surgical site, and procedure verified   Procedure prep:  Patient was prepped and draped in usual sterile fashion Prep type:  Isopropyl alcohol Anesthesia: the lesion was anesthetized in a standard fashion   Anesthetic:  1% lidocaine w/ epinephrine 1-100,000 buffered w/ 8.4% NaHCO3 Instrument used: flexible razor blade   Hemostasis achieved with: pressure, aluminum chloride and electrodesiccation   Outcome: patient tolerated procedure well   Post-procedure details: sterile dressing applied and  wound care instructions given   Dressing type: bandage and petrolatum    Other Related Procedures Anatomic Pathology Report  Viral warts, unspecified type Right 2nd Finger Tip  Destruction of lesion - Right 2nd Finger Tip Complexity: simple   Destruction method: cryotherapy   Informed consent: discussed and consent obtained   Timeout:  patient name, date of birth, surgical site, and procedure verified Lesion destroyed using liquid nitrogen: Yes   Region frozen until ice ball extended beyond lesion: Yes   Outcome: patient tolerated procedure well with no complications   Post-procedure details: wound care instructions given    Skin cancer screening  Return in about 3 months (around 05/18/2020) for ISK follow up.   I, Ashok Cordia, CMA, am acting as scribe for Sarina Ser, MD .  Documentation: I have reviewed the above documentation for accuracy and completeness, and I agree with the above.  Sarina Ser, MD

## 2020-02-17 ENCOUNTER — Encounter: Payer: Self-pay | Admitting: Dermatology

## 2020-02-19 LAB — ANATOMIC PATHOLOGY REPORT

## 2020-02-22 ENCOUNTER — Telehealth: Payer: Self-pay

## 2020-02-22 NOTE — Telephone Encounter (Signed)
Left message on voicemail to return my call.  

## 2020-02-22 NOTE — Telephone Encounter (Signed)
-----   Message from Ralene Bathe, MD sent at 02/22/2020  8:21 AM EDT ----- Diagnosis synopsis: Comment  Comment: Specimen A-Skin Excision, right upper eyelid: CONSISTENT  WITH EARLY SEBORRHEIC KERATOSIS, SEE COMMENT  Benign keratosis of right eyelid. No further treatment needed

## 2020-03-02 ENCOUNTER — Telehealth: Payer: Self-pay

## 2020-03-02 NOTE — Telephone Encounter (Signed)
Left message on voicemail to return my call.  

## 2020-03-02 NOTE — Telephone Encounter (Signed)
-----   Message from Ralene Bathe, MD sent at 02/22/2020  8:21 AM EDT ----- Diagnosis synopsis: Comment  Comment: Specimen A-Skin Excision, right upper eyelid: CONSISTENT  WITH EARLY SEBORRHEIC KERATOSIS, SEE COMMENT  Benign keratosis of right eyelid. No further treatment needed

## 2020-03-14 ENCOUNTER — Telehealth: Payer: Self-pay

## 2020-03-14 NOTE — Telephone Encounter (Signed)
Left message on voicemail to return my call.  

## 2020-03-14 NOTE — Telephone Encounter (Signed)
-----   Message from Ralene Bathe, MD sent at 02/22/2020  8:21 AM EDT ----- Diagnosis synopsis: Comment  Comment: Specimen A-Skin Excision, right upper eyelid: CONSISTENT  WITH EARLY SEBORRHEIC KERATOSIS, SEE COMMENT  Benign keratosis of right eyelid. No further treatment needed

## 2020-03-28 NOTE — Progress Notes (Deleted)
Name: Victor Valdez   MRN: [id number]    DOB: 1965/10/19   Date:03/28/2020       Progress Note  Subjective  Chief Complaint  Follow up  HPI HTN: no chest pain, no SOB, he denies dizziness or palpitationHe is compliant with medication.BP is at goal   ADD: patient is doing well on medication, he denies side effectsand states medication helps with focus.[name] He takes second dose in the afternoon and is able to stay focused until 6 pm, working from home has also helped.  No problems following asleep at night, normal appetite   Dyslipidemia: he isnottaking statin therapy, he had lipid panel in March that showed improvement of LDL and liver enzymes were high, he is going to have labs through work tomorrow.   Elevated liver enzymes:he was supposed to have repeat labsin June, but he did not go to have it done yet. AST was 215 and ALT was 190, he states he will have labs done at work tomorrow.   Cervical radiculitis: secondary to injury during [employer] , back in June 2021, currently seeing Dr. Lorelei Pont sports medicine doctor at [facility], last visit 12/23/2019 and is still taking prednisone, he is starting to feel better. He no longer has pain, he still has some right upper extremity weakness/numbness but is getting better. *** Patient Active Problem List   Diagnosis Date Noted  . Internal hemorrhoids 10/23/2016  . Diverticulosis 10/23/2016  . ADD (attention deficit disorder) 11/13/2014  . Basal cell carcinoma of face 11/13/2014  . CD (contact dermatitis) 11/13/2014  . Dyslipidemia 11/13/2014  . Blood glucose elevated 11/13/2014  . Benign hypertension 11/13/2014  . Perennial allergic rhinitis with seasonal variation 11/13/2014  . Allergic rhinitis 11/13/2014    Past Surgical History:  Procedure Laterality Date  . COLONOSCOPY WITH PROPOFOL N/A 10/08/2016   Procedure: COLONOSCOPY WITH PROPOFOL;  Surgeon: Lollie Sails, MD;  [facility] ENDOSCOPY;  Service:  Endoscopy;  Laterality: N/A;  . EYE SURGERY Bilateral    lasik  . LASIK Bilateral 12.30.2015    Family History  Problem Relation Age of Onset  . Hypertension Mother   . Cancer Father 67       prostate cancer  . Hypertension Sister   . Obesity Sister     Social History   Tobacco Use  . Smoking status: Never Smoker  . Smokeless tobacco: Former Network engineer Use Topics  . Alcohol use: Yes    Alcohol/week: 0.0 standard drinks     Current Outpatient Medications:  .  amphetamine-dextroamphetamine (ADDERALL) 10 MG tablet, 20 mg in am and 10 mg in the pm, Disp: 90 tablet, Rfl: 0 .  amphetamine-dextroamphetamine (ADDERALL) 10 MG tablet, 20 mg in am and 10 mg in the pm, Disp: 90 tablet, Rfl: 0 .  amphetamine-dextroamphetamine (ADDERALL) 10 MG tablet, 20 mg in am and 10 mg in the pm, Disp: 90 tablet, Rfl: 0 .  atenolol (TENORMIN) 25 MG tablet, Take 1 tablet (25 mg total) by mouth every evening., Disp: 90 tablet, Rfl: 1 .  cetirizine (ZYRTEC) 10 MG tablet, Take 1 tablet by mouth daily. (Patient not taking: Reported on 12/31/2019), Disp: , Rfl:  .  clindamycin (CLEOCIN-T) 1 % lotion, Apply topically as directed. qd-bid to chest and chin, Disp: 60 mL, Rfl: 3 .  Olmesartan-amLODIPine-HCTZ 20-5-12.5 MG TABS, Take 1 tablet by mouth daily., Disp: 90 tablet, Rfl: 4 .  predniSONE (DELTASONE) 20 MG tablet, 2 tabs po for 7 days, then 1  tab po for 7 days, Disp: 21 tablet, Rfl: 0 .  triamcinolone cream (KENALOG) 0.1 %, APPLY TWICE A DAY AS DIRECTED, Disp: 454 g, Rfl: 0 .  Vitamin D, Ergocalciferol, (DRISDOL) 1.25 MG (50000 UNIT) CAPS capsule, Take 1 capsule (50,000 Units total) by mouth every 7 (seven) days., Disp: 12 capsule, Rfl: 1  Allergies  Allergen Reactions  . Aspirin Anaphylaxis  . Nsaids   . Penicillins     I personally reviewed {Reviewed:14835} with the patient/caregiver today.   ROS  ***  Objective  There were no vitals filed for this visit.  There is no height or weight  on file to calculate BMI.  Physical Exam ***  Recent Results (from the past 2160 hour(s))  Anatomic Pathology Report     Status: None   Collection Time: 02/16/20 12:00 AM  Result Value Ref Range   Diagnosis synopsis: Comment     Comment: Specimen A-Skin Excision, right upper eyelid: CONSISTENT WITH EARLY SEBORRHEIC KERATOSIS, SEE COMMENT    Specimen: Comment     Comment: Specimen A-Skin Excision, right upper eyelid   Clinical history: Comment     Comment: brown macule with 3 mm papule on right upper eyelid-nevus vs SK R/O ca    Diagnosis: Comment     Comment: Specimen A-CONSISTENT WITH EARLY SEBORRHEIC KERATOSIS, SEE COMMENT    Comment: Comment     Comment: Specimen A-Similar microscopic features can also be observed in seborrheic keratosis-like epidermal nevus which has been reported in eye lid (Ophthalmic Plast Reconstr Surg. Nov-Dec 2012;28(6):e135-8).    Microscopic description: Comment     Comment: Specimen A-There is hyperkeratosis, acanthosis, occasional horn pseudocysts, and mild papillomatosis. There is no atypia of the keratinocytes.    Gross description: Comment     Comment: The specimen is received in formalin labeled "Reinitz, Kiron, right upper eyelid". Received is a 1 piece of tan, soft skin measuring 0.6 x 0.2 x 0.1 cm. The specimen is submitted intact entirely in cassette 1.    Electronically signed by: Comment     Comment: Lizabeth Leyden, MD, PhD, Dermatopathologist   CPT code(s): Comment     Comment: Specimen [id number]   CPT Disclaimer: Comment     Comment: CPT codes, as published by the AMA, are provided for informational purpose only as the assignment of the CPT codes is the responsibility of the billing party.    Clinician provided ICD: D48.5    Pathologist provided ICD: L82.1     Diabetic Foot Exam: Diabetic Foot Exam - Simple   No data filed     ***  PHQ2/9: Depression screen [facility] 2/9 12/31/2019 09/30/2019 07/01/2019 03/26/2019 01/06/2019   Decreased Interest 0 0 0 0 0  Down, Depressed, Hopeless 0 0 0 0 0  PHQ - 2 Score 0 0 0 0 0  Altered sleeping 0 0 0 0 3  Tired, decreased energy 0 0 0 0 0  Change in appetite 0 0 0 0 0  Feeling bad or failure about yourself  0 0 0 0 0  Trouble concentrating 0 0 0 0 0  Moving slowly or fidgety/restless 0 0 0 0 0  Suicidal thoughts 0 0 0 0 0  PHQ-9 Score 0 0 0 0 3  Difficult doing work/chores - - - - Not difficult at all  Some recent data might be hidden    phq 9 is {gen pos [id number]} ***  Fall Risk: Fall Risk  12/31/2019 10/30/2019 09/30/2019 07/01/2019 03/26/2019  Falls in the  past year? 0 1 0 0 0  Number falls in past yr: 0 0 0 0 0  Injury with Fall? 0 1 0 0 0   ***   Functional Status Survey:   ***   Assessment & Plan  *** There are no diagnoses linked to this encounter.

## 2020-03-30 ENCOUNTER — Ambulatory Visit: Payer: 59 | Admitting: Family Medicine

## 2020-03-30 DIAGNOSIS — R55 Syncope and collapse: Secondary | ICD-10-CM | POA: Insufficient documentation

## 2020-03-30 DIAGNOSIS — I471 Supraventricular tachycardia: Secondary | ICD-10-CM | POA: Insufficient documentation

## 2020-04-04 ENCOUNTER — Ambulatory Visit: Payer: 59 | Admitting: Family Medicine

## 2020-04-04 ENCOUNTER — Other Ambulatory Visit: Payer: Self-pay

## 2020-04-04 ENCOUNTER — Encounter: Payer: Self-pay | Admitting: Family Medicine

## 2020-04-04 VITALS — BP 142/88 | HR 89 | Temp 98.1°F | Resp 18 | Ht 70.0 in | Wt 202.3 lb

## 2020-04-04 DIAGNOSIS — I10 Essential (primary) hypertension: Secondary | ICD-10-CM

## 2020-04-04 DIAGNOSIS — G4700 Insomnia, unspecified: Secondary | ICD-10-CM

## 2020-04-04 DIAGNOSIS — R55 Syncope and collapse: Secondary | ICD-10-CM | POA: Diagnosis not present

## 2020-04-04 DIAGNOSIS — E785 Hyperlipidemia, unspecified: Secondary | ICD-10-CM

## 2020-04-04 DIAGNOSIS — R7989 Other specified abnormal findings of blood chemistry: Secondary | ICD-10-CM

## 2020-04-04 DIAGNOSIS — I77810 Thoracic aortic ectasia: Secondary | ICD-10-CM

## 2020-04-04 DIAGNOSIS — K760 Fatty (change of) liver, not elsewhere classified: Secondary | ICD-10-CM

## 2020-04-04 DIAGNOSIS — I471 Supraventricular tachycardia, unspecified: Secondary | ICD-10-CM

## 2020-04-04 DIAGNOSIS — Z09 Encounter for follow-up examination after completed treatment for conditions other than malignant neoplasm: Secondary | ICD-10-CM

## 2020-04-04 DIAGNOSIS — F901 Attention-deficit hyperactivity disorder, predominantly hyperactive type: Secondary | ICD-10-CM

## 2020-04-04 MED ORDER — TEMAZEPAM 15 MG PO CAPS: 15.0000 mg | ORAL_CAPSULE | Freq: Every evening | ORAL | 0 refills | Status: DC | PRN

## 2020-04-04 MED ORDER — ATOMOXETINE HCL 18 MG PO CAPS: 18.0000 mg | ORAL_CAPSULE | Freq: Two times a day (BID) | ORAL | 0 refills | Status: DC

## 2020-04-04 NOTE — Progress Notes (Signed)
Name: Victor Valdez   MRN: [id number]    DOB: [birth year]   Date:04/04/2020       Progress Note  [facility] discharge follow up:  Admission to [facility] on 03/30/2020 and discharge on 03/31/2020   Victor Valdez was at a basketball game and had a syncopal episode on 03/29/2020 while leaving the game. He also had some difficulty speaking when he tried to get up again. He had another episode when he got up again. He had people near him right away, he got agitated and wanted to go home. He developed slurred speech again. He did not have bowel or bladder incontinence, but had nausea. He was orthostatic when initially checked by EMT's. He was transported to [facility] by EMS. He got IV fluids at [facility] before he was seen for about 5 hours. Heart rate went up to 220 at rest , he  was found to be in narrow complex SVT they tried valsalva maneuver followed by adenosine , had hypercalcemia ( initial calcium was 12.7 )  and low magnesium levels ( 1.2), parathyroid hormone was low at 10.1 , positive D-dimer, he also had hypokalemia due to hydration, negative cardiac enzymes. One of his lab showed very low white count, platelets and HCT ( however it was drawn from the same arm as the IV fluids and likely a false abnormality - repeat levels were normal) slightly elevated liver enzymes  . He was given IV fluids, had CT of head and chest looking for malignancy and also causes of syncope.He was not orthostatic but advised to hold HCTZ because of hypercalcemia and also Olmasartan to monitor bp without medication, he was sent home on Norvasc only and advised to have close follow up and repeat labs. He states he has not been taking bp medication since they told him to monitor it at home.   Echo:  Summary 1. Technically difficult study. 2. The left ventricle is normal in size with normal wall thickness. 3. The left ventricular systolic function is hyperdynamic, LVEF is visually estimated at >  55%. 4. The right ventricle is normal in size, with normal systolic function. 5. The ascending aorta is mildly dilated.  CTA chest:   Negative for acute finding Hiatal hernia and hepatic steatosis noticed   CT neck:   Disc spaces and facet joints: Mild multilevel degenerative disc disease, worst at C5-C6 and C6-C7 with small posterior disc osteophyte complexes. The spinal canal is diffusely narrow. Moderate bony canal stenosis at C5-C6 and C6-C7.   Moderate bilateral facet arthropathy at C7-T1. Severe bilateral neural foraminal stenosis at C3-C4 and moderate right and severe left neural foraminal stenosis at C4-C5 secondary to uncovertebral hypertrophy.    CT brain:   IMPRESSION:   No acute intracranial abnormality.   Mild diffuse cerebral atrophy with ex vacuo ventriculomegaly.    Patient Active Problem List   Diagnosis Date Noted  . Mild dilation of ascending aorta (HCC) 04/04/2020  . Hepatic steatosis 04/04/2020  . Low serum parathyroid hormone (PTH) 04/04/2020  . Hypomagnesemia 04/04/2020  . Hypercalcemia 04/04/2020  . Narrow complex tachycardia ([place]) 03/30/2020  . Syncope 03/30/2020  . Internal hemorrhoids 10/23/2016  . Diverticulosis 10/23/2016  . ADD (attention deficit disorder) 11/13/2014  . Basal cell carcinoma of face 11/13/2014  . CD (contact dermatitis) 11/13/2014  . Dyslipidemia 11/13/2014  . Blood glucose elevated 11/13/2014  . Benign hypertension 11/13/2014  . Perennial allergic rhinitis with seasonal variation 11/13/2014  .  Allergic rhinitis 11/13/2014    Past Surgical History:  Procedure Laterality Date  . COLONOSCOPY WITH PROPOFOL N/A 10/08/2016   Procedure: COLONOSCOPY WITH PROPOFOL;  Surgeon: Lollie Sails, MD;  Location: [facility] ENDOSCOPY;  Service: Endoscopy;  Laterality: N/A;  . EYE SURGERY Bilateral    lasik  . LASIK Bilateral 12.30.2015    Family History  Problem Relation Age of Onset  . Hypertension Mother   . Cancer Father 42        prostate cancer  . Hypertension Sister   . Obesity Sister     Social History   Tobacco Use  . Smoking status: Never Smoker  . Smokeless tobacco: Former Network engineer Use Topics  . Alcohol use: Yes    Alcohol/week: 0.0 standard drinks     Current Outpatient Medications:  .  amLODipine (NORVASC) 5 MG tablet, Take by mouth., Disp: , Rfl:  .  amphetamine-dextroamphetamine (ADDERALL) 10 MG tablet, 20 mg in am and 10 mg in the pm, Disp: 90 tablet, Rfl: 0 .  amphetamine-dextroamphetamine (ADDERALL) 10 MG tablet, 20 mg in am and 10 mg in the pm, Disp: 90 tablet, Rfl: 0 .  amphetamine-dextroamphetamine (ADDERALL) 10 MG tablet, 20 mg in am and 10 mg in the pm, Disp: 90 tablet, Rfl: 0 .  atenolol (TENORMIN) 25 MG tablet, Take 1 tablet (25 mg total) by mouth every evening., Disp: 90 tablet, Rfl: 1 .  fluticasone (FLONASE) 50 MCG/ACT nasal spray, 1 spray into each nostril daily., Disp: , Rfl:  .  magnesium oxide (MAG-OX) 400 MG tablet, Take by mouth., Disp: , Rfl:  .  potassium chloride SA (KLOR-CON) 20 MEQ tablet, Take 40 mEq by mouth 2 (two) times daily., Disp: , Rfl:  .  clindamycin (CLEOCIN-T) 1 % lotion, Apply topically as directed. qd-bid to chest and chin (Patient not taking: Reported on 04/04/2020), Disp: 60 mL, Rfl: 3  Allergies  Allergen Reactions  . Aspirin Anaphylaxis  . Nsaids   . Penicillins     I personally reviewed active problem list, medication list, allergies, family history, social history, health maintenance, notes from last encounter with the patient/caregiver today.   ROS  Constitutional: Negative for fever or weight change.  Respiratory: Negative for cough and shortness of breath.   Cardiovascular: Negative for chest pain or palpitations.  Gastrointestinal: Negative for abdominal pain, no bowel changes.  Musculoskeletal: Negative for gait problem or joint swelling.  Skin: Negative for rash.  Neurological: Negative for dizziness or headache.  No other  specific complaints in a complete review of systems (except as listed in HPI above).  Objective  Vitals:   04/04/20 1316  BP: (!) 142/88  Pulse: (!) 110  Resp: 18  Temp: 98.1 F (36.7 C)  TempSrc: Oral  SpO2: 99%  Weight: 202 lb 4.8 oz (91.8 kg)  Height: 5\' 10"  (1.778 m)    Body mass index is 29.03 kg/m.  Physical Exam  Constitutional: Patient appears well-developed and well-nourished. O No distress.  HEENT: head atraumatic, normocephalic, pupils equal and reactive to light,  neck supple Cardiovascular: Normal rate, regular rhythm and normal heart sounds.  No murmur heard. No BLE edema. Pulmonary/Chest: Effort normal and breath sounds normal. No respiratory distress. Abdominal: Soft.  There is no tenderness. Head : right forehead with small erythema from recent fall/syncope Psychiatric: Patient has a normal mood and affect. behavior is normal. Judgment and thought content normal.  Recent Results (from the past 2160 hour(s))  Anatomic Pathology Report  Status: None   Collection Time: 02/16/20 12:00 AM  Result Value Ref Range   Diagnosis synopsis: Comment     Comment: Specimen A-Skin Excision, right upper eyelid: CONSISTENT WITH EARLY SEBORRHEIC KERATOSIS, SEE COMMENT    Specimen: Comment     Comment: Specimen A-Skin Excision, right upper eyelid   Clinical history: Comment     Comment: brown macule with 3 mm papule on right upper eyelid-nevus vs SK R/O ca    Diagnosis: Comment     Comment: Specimen A-CONSISTENT WITH EARLY SEBORRHEIC KERATOSIS, SEE COMMENT    Comment: Comment     Comment: Specimen A-Similar microscopic features can also be observed in seborrheic keratosis-like epidermal nevus which has been reported in eye lid (Ophthalmic Plast Reconstr Surg. Nov-Dec 2012;28(6):e135-8).    Microscopic description: Comment     Comment: Specimen A-There is hyperkeratosis, acanthosis, occasional horn pseudocysts, and mild papillomatosis. There is no atypia of the  keratinocytes.    Gross description: Comment     Comment: The specimen is received in formalin labeled "Khatib, Vamsi, right upper eyelid". Received is a 1 piece of tan, soft skin measuring 0.6 x 0.2 x 0.1 cm. The specimen is submitted intact entirely in cassette 1.    Electronically signed by: Comment     Comment: Lizabeth Leyden, MD, PhD, Dermatopathologist   CPT code(s): Comment     Comment: Specimen [id number]   CPT Disclaimer: Comment     Comment: CPT codes, as published by the AMA, are provided for informational purpose only as the assignment of the CPT codes is the responsibility of the billing party.    Clinician provided ICD: D48.5    Pathologist provided ICD: L82.1       PHQ2/9: Depression screen [facility] 2/9 04/04/2020 12/31/2019 09/30/2019 07/01/2019 03/26/2019  Decreased Interest 1 0 0 0 0  Down, Depressed, Hopeless 2 0 0 0 0  PHQ - 2 Score 3 0 0 0 0  Altered sleeping 3 0 0 0 0  Tired, decreased energy 1 0 0 0 0  Change in appetite 2 0 0 0 0  Feeling bad or failure about yourself  3 0 0 0 0  Trouble concentrating 3 0 0 0 0  Moving slowly or fidgety/restless 0 0 0 0 0  Suicidal thoughts 0 0 0 0 0  PHQ-9 Score 15 0 0 0 0  Difficult doing work/chores - - - - -  Some recent data might be hidden    phq 9 is only positive because of increase stress over the last week due to syncope and hospital stay     Fall Risk: Fall Risk  04/04/2020 12/31/2019 10/30/2019 09/30/2019 07/01/2019  Falls in the past year? 1 0 1 0 0  Number falls in past yr: 0 0 0 0 0  Injury with Fall? 1 0 1 0 0     Functional Status Survey: Is the patient deaf or have difficulty hearing?: No Does the patient have difficulty seeing, even when wearing glasses/contacts?: No Does the patient have difficulty concentrating, remembering, or making decisions?: Yes Does the patient have difficulty walking or climbing stairs?: No Does the patient have difficulty dressing or bathing?: No Does the patient have  difficulty doing errands alone such as visiting a doctor's office or shopping?: No    Assessment & Plan  1. Syncope, unspecified syncope type   2. Hypercalcemia  - Parathyroid hormone, intact (no Ca)  3. Hospital discharge follow-up  Medication reconciliation done during his visit  4. Mild dilation of ascending aorta ([place])  - Ambulatory referral to Cardiology  5. Hypomagnesemia  - Magnesium  6. Low serum parathyroid hormone (PTH)  - Hemoglobin A1c  7. Hepatic steatosis   8. SVT (supraventricular tachycardia) (HCC)  Discussed valsalva maneuver  - Comprehensive metabolic panel - Ambulatory referral to Cardiology  9. Attention deficit hyperactivity disorder (ADHD), predominantly hyperactive type  He needs medication to stay focused, he has been off stimulants since last week , we will try Strattera, he will let me know by next visit if we will need to adjust the dose - atomoxetine (STRATTERA) 18 MG capsule; Take 1 capsule (18 mg total) by mouth 2 (two) times daily with a meal.  Dispense: 60 capsule; Refill: 0  10. Dyslipidemia  - Lipid panel  11. Benign hypertension  - Ambulatory referral to Cardiology

## 2020-04-05 ENCOUNTER — Other Ambulatory Visit: Payer: Self-pay | Admitting: Emergency Medicine

## 2020-04-05 ENCOUNTER — Telehealth: Payer: Self-pay | Admitting: Family Medicine

## 2020-04-05 DIAGNOSIS — R748 Abnormal levels of other serum enzymes: Secondary | ICD-10-CM

## 2020-04-05 LAB — COMPREHENSIVE METABOLIC PANEL
ALT: 176 IU/L — ABNORMAL HIGH (ref 0–44)
AST: 125 IU/L — ABNORMAL HIGH (ref 0–40)
Albumin/Globulin Ratio: 1.6 (ref 1.2–2.2)
Albumin: 4.4 g/dL (ref 3.8–4.9)
Alkaline Phosphatase: 88 IU/L (ref 44–121)
BUN/Creatinine Ratio: 12 (ref 9–20)
BUN: 12 mg/dL (ref 6–24)
Bilirubin Total: 0.6 mg/dL (ref 0.0–1.2)
CO2: 22 mmol/L (ref 20–29)
Calcium: 9.4 mg/dL (ref 8.7–10.2)
Chloride: 99 mmol/L (ref 96–106)
Creatinine, Ser: 1.03 mg/dL (ref 0.76–1.27)
GFR calc Af Amer: 95 mL/min/{1.73_m2} (ref >59)
GFR calc non Af Amer: 83 mL/min/{1.73_m2} (ref >59)
Globulin, Total: 2.8 g/dL (ref 1.5–4.5)
Glucose: 116 mg/dL — ABNORMAL HIGH (ref 65–99)
Potassium: 5.2 mmol/L (ref 3.5–5.2)
Sodium: 134 mmol/L (ref 134–144)
Total Protein: 7.2 g/dL (ref 6.0–8.5)

## 2020-04-05 LAB — LIPID PANEL
Chol/HDL Ratio: 3.1 ratio (ref 0.0–5.0)
Cholesterol, Total: 193 mg/dL (ref 100–199)
HDL: 62 mg/dL (ref >39)
LDL Chol Calc (NIH): 116 mg/dL — ABNORMAL HIGH (ref 0–99)
Triglycerides: 82 mg/dL (ref 0–149)
VLDL Cholesterol Cal: 15 mg/dL (ref 5–40)

## 2020-04-05 LAB — PARATHYROID HORMONE, INTACT (NO CA): PTH: 48 pg/mL (ref 15–65)

## 2020-04-05 LAB — MAGNESIUM: Magnesium: 2.1 mg/dL (ref 1.6–2.3)

## 2020-04-05 LAB — HEMOGLOBIN A1C
Est. average glucose Bld gHb Est-mCnc: 103 mg/dL
Hgb A1c MFr Bld: 5.2 % (ref 4.8–5.6)

## 2020-04-05 NOTE — Telephone Encounter (Signed)
The medication atomoxetine 18 mg required a prior authorization from his insurance. Started prior authorization on CoverMyMeds (Key: [id number]) on today and medication is approved.   Approvedtoday Request Reference Number: [id number]. ATOMOXETINE CAP 18MG  is approved through 04/05/2021. Your patient may now fill this prescription and it will be covered.

## 2020-04-05 NOTE — Telephone Encounter (Signed)
Spoke with patient per Dr. Ancil Boozer' request to inform him that most of his labs were normal except for the AST and ALT. Dr. Ancil Boozer will place a referral to [facility] to help figure out why the AST and ALT was elevated.  Patient verbalized understanding.

## 2020-04-20 ENCOUNTER — Encounter: Payer: 59 | Admitting: Dermatology

## 2020-04-20 NOTE — Progress Notes (Signed)
Name: Victor Valdez   MRN: [id number]    DOB: May 19, 1965   Date:04/21/2020       Progress Note  Subjective  Chief Complaint  Follow up   HPI   ADD: we stopped Adderal because of episodes of narrow complex tachycardia and syncope that happened Nov 2021. He is now on low dose strattera for the past month, he states it seems to help but not like Adderal. He denies any side effects. We will adjust dose from 18 to 25 mg today  Hypercalcemia : resolved  Elevated liver enzymes/Alcoholism in early remission: he told me today that he has been a heavy drinker on and off since law school, he goes through periods that he is able to quit. He states over the past 2 years he gradually increased alcohol consumption to 8 glasses of scotch per day. He was drinking from 1 pm on. He states he quit about 2 months ago. There is a family history of alcoholism - sister and father . He has been exercising instead of drinking   Snoring: he also has daily fatigue, recent episode of syncope. ESS of 10, we will check sleep study  HTN:he is on beta-blocker now and we will add low dose losartan since bp is still elevated today, he has follow up with cardiologist tomorrow. Return in 2 months for follow up with me. He denies chest pain or palpitation   Insomnia: spending about 7 hours in bed, sleep is interrupted based on his iphone watch, getting about 5.5 hours of sleep, feels tired during the day. We will continue temazepam for now, waiting to see neurologist in January   Patient Active Problem List   Diagnosis Date Noted  . Mild dilation of ascending aorta (HCC) 04/04/2020  . Hepatic steatosis 04/04/2020  . Low serum parathyroid hormone (PTH) 04/04/2020  . Hypomagnesemia 04/04/2020  . Hypercalcemia 04/04/2020  . Narrow complex tachycardia ([place]) 03/30/2020  . Syncope 03/30/2020  . Internal hemorrhoids 10/23/2016  . Diverticulosis 10/23/2016  . ADD (attention deficit disorder) 11/13/2014  . Basal cell carcinoma  of face 11/13/2014  . CD (contact dermatitis) 11/13/2014  . Dyslipidemia 11/13/2014  . Blood glucose elevated 11/13/2014  . Benign hypertension 11/13/2014  . Perennial allergic rhinitis with seasonal variation 11/13/2014  . Allergic rhinitis 11/13/2014    Past Surgical History:  Procedure Laterality Date  . COLONOSCOPY WITH PROPOFOL N/A 10/08/2016   Procedure: COLONOSCOPY WITH PROPOFOL;  Surgeon: Lollie Sails, MD;  Location: [facility] ENDOSCOPY;  Service: Endoscopy;  Laterality: N/A;  . EYE SURGERY Bilateral    lasik  . LASIK Bilateral 12.30.2015    Family History  Problem Relation Age of Onset  . Hypertension Mother   . Cancer Father 98       prostate cancer  . Hypertension Sister   . Obesity Sister     Social History   Tobacco Use  . Smoking status: Never Smoker  . Smokeless tobacco: Former Network engineer Use Topics  . Alcohol use: Yes    Alcohol/week: 0.0 standard drinks     Current Outpatient Medications:  .  atenolol (TENORMIN) 25 MG tablet, Take 1 tablet (25 mg total) by mouth every evening., Disp: 90 tablet, Rfl: 1 .  atomoxetine (STRATTERA) 18 MG capsule, Take 1 capsule (18 mg total) by mouth 2 (two) times daily with a meal., Disp: 60 capsule, Rfl: 0 .  fluticasone (FLONASE) 50 MCG/ACT nasal spray, 1 spray into each nostril daily., Disp: , Rfl:  .  magnesium  oxide (MAG-OX) 400 MG tablet, Take by mouth., Disp: , Rfl:  .  temazepam (RESTORIL) 15 MG capsule, Take 1 capsule (15 mg total) by mouth at bedtime as needed for sleep., Disp: 30 capsule, Rfl: 0 .  clindamycin (CLEOCIN-T) 1 % lotion, Apply topically as directed. qd-bid to chest and chin (Patient not taking: Reported on 04/21/2020), Disp: 60 mL, Rfl: 3 .  potassium chloride SA (KLOR-CON) 20 MEQ tablet, Take 40 mEq by mouth 2 (two) times daily. (Patient not taking: Reported on 04/21/2020), Disp: , Rfl:   Allergies  Allergen Reactions  . Aspirin Anaphylaxis  . Nsaids   . Penicillins     I personally  reviewed active problem list, medication list, allergies, family history, social history, health maintenance with the patient/caregiver today.   ROS  Constitutional: Negative for fever or weight change.  Respiratory: Negative for cough and shortness of breath.   Cardiovascular: Negative for chest pain or palpitations.  Gastrointestinal: Negative for abdominal pain, no bowel changes.  Musculoskeletal: Negative for gait problem or joint swelling.  Skin: Negative for rash.  Neurological: Negative for dizziness or headache.  No other specific complaints in a complete review of systems (except as listed in HPI above).  Objective  Vitals:   04/21/20 1137  BP: (!) 148/86  Pulse: 86  Resp: 16  Temp: 98 F (36.7 C)  TempSrc: Oral  SpO2: 98%  Weight: 205 lb 12.8 oz (93.4 kg)  Height: 5' 10"  (1.778 m)    Body mass index is 29.53 kg/m.  Physical Exam  Constitutional: Patient appears well-developed and well-nourished. Overweight.  No distress.  HEENT: head atraumatic, normocephalic, pupils equal and reactive to light,  neck supple Cardiovascular: Normal rate, regular rhythm and normal heart sounds.  No murmur heard. No BLE edema. Pulmonary/Chest: Effort normal and breath sounds normal. No respiratory distress. Abdominal: Soft.  There is no tenderness. Psychiatric: Patient has a normal mood and affect. behavior is normal. Judgment and thought content normal.  Recent Results (from the past 2160 hour(s))  Anatomic Pathology Report     Status: None   Collection Time: 02/16/20 12:00 AM  Result Value Ref Range   Diagnosis synopsis: Comment     Comment: Specimen A-Skin Excision, right upper eyelid: CONSISTENT WITH EARLY SEBORRHEIC KERATOSIS, SEE COMMENT    Specimen: Comment     Comment: Specimen A-Skin Excision, right upper eyelid   Clinical history: Comment     Comment: brown macule with 3 mm papule on right upper eyelid-nevus vs SK R/O ca    Diagnosis: Comment     Comment:  Specimen A-CONSISTENT WITH EARLY SEBORRHEIC KERATOSIS, SEE COMMENT    Comment: Comment     Comment: Specimen A-Similar microscopic features can also be observed in seborrheic keratosis-like epidermal nevus which has been reported in eye lid (Ophthalmic Plast Reconstr Surg. Nov-Dec 2012;28(6):e135-8).    Microscopic description: Comment     Comment: Specimen A-There is hyperkeratosis, acanthosis, occasional horn pseudocysts, and mild papillomatosis. There is no atypia of the keratinocytes.    Gross description: Comment     Comment: The specimen is received in formalin labeled "Prak, Vonn, right upper eyelid". Received is a 1 piece of tan, soft skin measuring 0.6 x 0.2 x 0.1 cm. The specimen is submitted intact entirely in cassette 1.    Electronically signed by: Comment     Comment: Lizabeth Leyden, MD, PhD, Dermatopathologist   CPT code(s): Comment     Comment: Specimen [id number]   CPT Disclaimer: Comment  Comment: CPT codes, as published by the AMA, are provided for informational purpose only as the assignment of the CPT codes is the responsibility of the billing party.    Clinician provided ICD: D48.5    Pathologist provided ICD: L82.1   Comprehensive metabolic panel     Status: Abnormal   Collection Time: 04/04/20  2:21 PM  Result Value Ref Range   Glucose 116 (H) 65 - 99 mg/dL   BUN 12 6 - 24 mg/dL   Creatinine, Ser 1.03 0.76 - 1.27 mg/dL   GFR calc non Af Amer 83 >59 mL/min/1.73   GFR calc Af Amer 95 >59 mL/min/1.73    Comment: **In accordance with recommendations from the NKF-ASN Task force,**   [facility] is in the process of updating its eGFR calculation to the   2021 CKD-EPI creatinine equation that estimates kidney function   without a race variable.    BUN/Creatinine Ratio 12 9 - 20   Sodium 134 134 - 144 mmol/L   Potassium 5.2 3.5 - 5.2 mmol/L   Chloride 99 96 - 106 mmol/L   CO2 22 20 - 29 mmol/L   Calcium 9.4 8.7 - 10.2 mg/dL   Total Protein 7.2 6.0 -  8.5 g/dL   Albumin 4.4 3.8 - 4.9 g/dL   Globulin, Total 2.8 1.5 - 4.5 g/dL   Albumin/Globulin Ratio 1.6 1.2 - 2.2   Bilirubin Total 0.6 0.0 - 1.2 mg/dL   Alkaline Phosphatase 88 44 - 121 IU/L    Comment:               **Please note reference interval change**   AST 125 (H) 0 - 40 IU/L   ALT 176 (H) 0 - 44 IU/L  Hemoglobin A1c     Status: None   Collection Time: 04/04/20  2:21 PM  Result Value Ref Range   Hgb A1c MFr Bld 5.2 4.8 - 5.6 %    Comment:          Prediabetes: 5.7 - 6.4          Diabetes: >6.4          Glycemic control for adults with diabetes: <7.0    Est. average glucose Bld gHb Est-mCnc 103 mg/dL  Parathyroid hormone, intact (no Ca)     Status: None   Collection Time: 04/04/20  2:21 PM  Result Value Ref Range   PTH 48 15 - 65 pg/mL  Magnesium     Status: None   Collection Time: 04/04/20  2:21 PM  Result Value Ref Range   Magnesium 2.1 1.6 - 2.3 mg/dL  Lipid panel     Status: Abnormal   Collection Time: 04/04/20  2:21 PM  Result Value Ref Range   Cholesterol, Total 193 100 - 199 mg/dL   Triglycerides 82 0 - 149 mg/dL   HDL 62 >39 mg/dL   VLDL Cholesterol Cal 15 5 - 40 mg/dL   LDL Chol Calc (NIH) 116 (H) 0 - 99 mg/dL   Chol/HDL Ratio 3.1 0.0 - 5.0 ratio    Comment:                                   T. Chol/HDL Ratio  Men  Women                               1/2 Avg.Risk  3.4    3.3                                   Avg.Risk  5.0    4.4                                2X Avg.Risk  9.6    7.1                                3X Avg.Risk 23.4   11.0      PHQ2/9: Depression screen [facility] 2/9 04/21/2020 04/04/2020 12/31/2019 09/30/2019 07/01/2019  Decreased Interest 2 1 0 0 0  Down, Depressed, Hopeless 1 2 0 0 0  PHQ - 2 Score 3 3 0 0 0  Altered sleeping 1 3 0 0 0  Tired, decreased energy 0 1 0 0 0  Change in appetite 0 2 0 0 0  Feeling bad or failure about yourself  0 3 0 0 0  Trouble concentrating 1 3 0 0 0  Moving slowly  or fidgety/restless 0 0 0 0 0  Suicidal thoughts 0 0 0 0 0  PHQ-9 Score 5 15 0 0 0  Difficult doing work/chores Somewhat difficult - - - -  Some recent data might be hidden    phq 9 is positive   Fall Risk: Fall Risk  04/21/2020 04/04/2020 12/31/2019 10/30/2019 09/30/2019  Falls in the past year? 1 1 0 1 0  Number falls in past yr: 0 0 0 0 0  Injury with Fall? 1 1 0 1 0  Risk for fall due to : History of fall(s) - - - -  Follow up Falls prevention discussed - - - -     Functional Status Survey: Is the patient deaf or have difficulty hearing?: No Does the patient have difficulty seeing, even when wearing glasses/contacts?: No Does the patient have difficulty concentrating, remembering, or making decisions?: Yes Does the patient have difficulty walking or climbing stairs?: No Does the patient have difficulty dressing or bathing?: No Does the patient have difficulty doing errands alone such as visiting a doctor's office or shopping?: No    Assessment & Plan  1. Benign hypertension  - losartan (COZAAR) 25 MG tablet; Take 1 tablet (25 mg total) by mouth daily.  Dispense: 30 tablet; Refill: 1 - Ambulatory referral to Sleep Studies  2. Attention deficit hyperactivity disorder (ADHD), predominantly hyperactive type  - atomoxetine (STRATTERA) 25 MG capsule; Take 1 capsule (25 mg total) by mouth 2 (two) times daily with a meal.  Dispense: 60 capsule; Refill: 0  3. History of syncope   4. Elevated liver enzymes  Has follow up with GI  5. Snoring  - Ambulatory referral to Sleep Studies  6. Insomnia, unspecified type  - temazepam (RESTORIL) 15 MG capsule; Take 1 capsule (15 mg total) by mouth at bedtime as needed for sleep.  Dispense: 30 capsule; Refill: 1  7. Alcoholism in remission ([place])

## 2020-04-21 ENCOUNTER — Other Ambulatory Visit: Payer: Self-pay

## 2020-04-21 ENCOUNTER — Encounter: Payer: Self-pay | Admitting: Family Medicine

## 2020-04-21 ENCOUNTER — Ambulatory Visit: Payer: 59 | Admitting: Family Medicine

## 2020-04-21 VITALS — BP 148/86 | HR 86 | Temp 98.0°F | Resp 16 | Ht 70.0 in | Wt 205.8 lb

## 2020-04-21 DIAGNOSIS — F901 Attention-deficit hyperactivity disorder, predominantly hyperactive type: Secondary | ICD-10-CM | POA: Diagnosis not present

## 2020-04-21 DIAGNOSIS — R748 Abnormal levels of other serum enzymes: Secondary | ICD-10-CM | POA: Diagnosis not present

## 2020-04-21 DIAGNOSIS — Z87898 Personal history of other specified conditions: Secondary | ICD-10-CM

## 2020-04-21 DIAGNOSIS — G4700 Insomnia, unspecified: Secondary | ICD-10-CM

## 2020-04-21 DIAGNOSIS — F1021 Alcohol dependence, in remission: Secondary | ICD-10-CM

## 2020-04-21 DIAGNOSIS — I10 Essential (primary) hypertension: Secondary | ICD-10-CM

## 2020-04-21 DIAGNOSIS — R0683 Snoring: Secondary | ICD-10-CM

## 2020-04-21 MED ORDER — LOSARTAN POTASSIUM 25 MG PO TABS: 25.0000 mg | ORAL_TABLET | Freq: Every day | ORAL | 1 refills | Status: DC

## 2020-04-21 MED ORDER — TEMAZEPAM 15 MG PO CAPS: 15.0000 mg | ORAL_CAPSULE | Freq: Every evening | ORAL | 1 refills | Status: DC | PRN

## 2020-04-21 MED ORDER — ATOMOXETINE HCL 25 MG PO CAPS: 25.0000 mg | ORAL_CAPSULE | Freq: Two times a day (BID) | ORAL | 0 refills | Status: DC

## 2020-04-22 ENCOUNTER — Ambulatory Visit: Payer: 59 | Admitting: Cardiology

## 2020-04-22 ENCOUNTER — Encounter: Payer: Self-pay | Admitting: Cardiology

## 2020-04-22 VITALS — BP 152/99 | HR 61 | Ht 70.0 in | Wt 206.0 lb

## 2020-04-22 DIAGNOSIS — I471 Supraventricular tachycardia: Secondary | ICD-10-CM

## 2020-04-22 DIAGNOSIS — I10 Essential (primary) hypertension: Secondary | ICD-10-CM | POA: Diagnosis not present

## 2020-04-22 DIAGNOSIS — R55 Syncope and collapse: Secondary | ICD-10-CM | POA: Diagnosis not present

## 2020-04-22 NOTE — Progress Notes (Signed)
Cardiology Office Note:    Date:  04/22/2020   ID:  Victor Valdez, DOB [birth year], MRN [id number]  PCP:  Steele Sizer, MD  [facility] Cardiologist:  Kate Sable, MD  [place] Electrophysiologist:  None   Referring MD: Steele Sizer, MD   Chief Complaint  Patient presents with  . New Patient (Initial Visit)    Referred by PCP for SVT and syncope. Meds reviewed verbally with patient.    Victor Valdez is a 54 y.o. male who is being seen today for the evaluation of syncope at the request of Steele Sizer, MD.   History of Present Illness:    Victor Valdez is a 54 y.o. male with a hx of hypertension, ADHD who presents due to syncope.  Patient was playing basketball on March 30, 2020 when he passed out.  He states not eating well that morning, and also not keeping up with his fluid intakes while working out and playing basketball.  When he finished, he felt a little lightheaded but did not think much of it.  He tried walking but felt his knees buckle, he grabbed onto the wall to prevent himself from falling.  He tried walking and suddenly passed out.  Does not remember much.  Woke up a little bit later with lots of people around him.  EMS told him his blood pressure dropped about 20-30 points upon standing.  He was taken to [facility] ED.  In the ED, EKG initially showed sinus rhythm.  Later on developed SVT.  Vagal maneuvers were tried without success.  He was given adenosine with resolution of symptoms.  He had electrolyte abnormalities with hypocalcemia, low magnesium.  He was managed with IV fluids, electrolytes were repleted.   Echocardiogram showed preserved ejection fraction, EF greater than 55%, LV normal size and wall thickness.  On discharge, it was recommended he switch Adderall.  Etiology of his syncope was determined to be a combination of dehydration, orthostasis, electrolyte abnormality .  His Adderall and HCTZ was stopped on discharge.  He has not had any  further episodes since, states doing okay.  Started taking losartan yesterday for BP control.  Currently takes atenolol.  Currently feels well, is back to exercising without any symptoms.  Denies any history of heart disease.     Past Medical History:  Diagnosis Date  . Adult attention deficit disorder   . Allergy   . Basal cell carcinoma    R shoulder per pt   . Basal cell epithelioma, face    Dr. Doristine Johns, R eye  . Hyperglycemia   . Hyperlipidemia   . Hypertension   . Snoring     Past Surgical History:  Procedure Laterality Date  . COLONOSCOPY WITH PROPOFOL N/A 10/08/2016   Procedure: COLONOSCOPY WITH PROPOFOL;  Surgeon: Lollie Sails, MD;  Location: [facility] ENDOSCOPY;  Service: Endoscopy;  Laterality: N/A;  . EYE SURGERY Bilateral    lasik  . LASIK Bilateral 12.30.2015    Current Medications: No outpatient medications have been marked as taking for the 04/22/20 encounter (Office Visit) with Kate Sable, MD.     Allergies:   Aspirin, Nsaids, and Penicillins   Social History   Socioeconomic History  . Marital status: Married    Spouse name: Nunzio Cory  . Number of children: 2  . Years of education: Not on file  . Highest education level: Master's degree (e.g., MA, MS, MEng, MEd, MSW, MBA)  Occupational History  . Not on file  Tobacco Use  .  Smoking status: Never Smoker  . Smokeless tobacco: Former Network engineer  . Vaping Use: Never used  Substance and Sexual Activity  . Alcohol use: Yes    Alcohol/week: 0.0 standard drinks  . Drug use: No  . Sexual activity: Yes    Partners: Female    Birth control/protection: None  Other Topics Concern  . Not on file  Social History Narrative  . Not on file   Social Determinants of Health   Financial Resource Strain: Not on file  Food Insecurity: Not on file  Transportation Needs: Not on file  Physical Activity: Not on file  Stress: Not on file  Social Connections: Not on file     Family  History: The patient's family history includes Cancer (age of onset: 81) in his father; Hypertension in his mother and sister; Obesity in his sister.  ROS:   Please see the history of present illness.     All other systems reviewed and are negative.  EKGs/Labs/Other Studies Reviewed:    The following studies were reviewed today:   EKG:  EKG is  ordered today.  The ekg ordered today demonstrates normal sinus rhythm, normal ECG.  Recent Labs: 04/04/2020: ALT 176; BUN 12; Creatinine, Ser 1.03; Magnesium 2.1; Potassium 5.2; Sodium 134  Recent Lipid Panel    Component Value Date/Time   CHOL 193 04/04/2020 1421   TRIG 82 04/04/2020 1421   HDL 62 04/04/2020 1421   CHOLHDL 3.1 04/04/2020 1421   LDLCALC 116 (H) 04/04/2020 1421     Risk Assessment/Calculations:      Physical Exam:    VS:  BP (!) 152/99 (BP Location: Left Arm, Patient Position: Sitting, Cuff Size: Normal)   Pulse 61   Ht 5\' 10"  (1.778 m)   Wt 206 lb (93.4 kg)   SpO2 98%   BMI 29.56 kg/m     Wt Readings from Last 3 Encounters:  04/22/20 206 lb (93.4 kg)  04/21/20 205 lb 12.8 oz (93.4 kg)  04/04/20 202 lb 4.8 oz (91.8 kg)     GEN:  Well nourished, well developed in no acute distress HEENT: Normal NECK: No JVD; No carotid bruits LYMPHATICS: No lymphadenopathy CARDIAC: RRR, no murmurs, rubs, gallops RESPIRATORY:  Clear to auscultation without rales, wheezing or rhonchi  ABDOMEN: Soft, non-tender, non-distended MUSCULOSKELETAL:  No edema; No deformity  SKIN: Warm and dry NEUROLOGIC:  Alert and oriented x 3 PSYCHIATRIC:  Normal affect   ASSESSMENT:    1. Syncope and collapse   2. SVT (supraventricular tachycardia) ([place])   3. Primary hypertension    PLAN:    In order of problems listed above:  1. Syncope and collapse, orthostatic vitals in the office today with no evidence for orthostasis.  Previous syncopal etiology is a combination of dehydration, SVT, electrolyte abnormalities.  Echo showed no  gross structural abnormalities.  Advised to stay hydrated when working out, Adderall has been held. 2. SVT, resolved after adenosine.  Continue atenolol at current dose.  If symptoms/SVT were to recur despite managing above etiologies, will consider EP input for ablation consideration. 3. Hypertension, BP elevated.  Started taking losartan yesterday.  Continue losartan and add Tylenol for now.  If BP stays elevated after 2 to 3 weeks, recommend increasing losartan to 50 mg daily.  Follow-up in 6 months.       Medication Adjustments/Labs and Tests Ordered: Current medicines are reviewed at length with the patient today.  Concerns regarding medicines are outlined above.  Orders Placed  This Encounter  Procedures  . EKG 12-Lead   No orders of the defined types were placed in this encounter.   Patient Instructions  Medication Instructions:  Your physician recommends that you continue on your current medications as directed. Please refer to the Current Medication list given to you today.  *If you need a refill on your cardiac medications before your next appointment, please call your pharmacy*   Lab Work: None Ordered If you have labs (blood work) drawn today and your tests are completely normal, you will receive your results only by: [name] MyChart Message (if you have MyChart) OR . A paper copy in the mail If you have any lab test that is abnormal or we need to change your treatment, we will call you to review the results.   Testing/Procedures: None Ordered   Follow-Up: At [facility], you and your health needs are our priority.  As part of our continuing mission to provide you with exceptional heart care, we have created designated Provider Care Teams.  These Care Teams include your primary Cardiologist (physician) and Advanced Practice Providers (APPs -  Physician Assistants and Nurse Practitioners) who all work together to provide you with the care you need, when you need  it.  We recommend signing up for the patient portal called "MyChart".  Sign up information is provided on this After Visit Summary.  MyChart is used to connect with patients for Virtual Visits (Telemedicine).  Patients are able to view lab/test results, encounter notes, upcoming appointments, etc.  Non-urgent messages can be sent to your provider as well.   To learn more about what you can do with MyChart, go to [email or web address].    Your next appointment:   6 month(s)  The format for your next appointment:   In Person  Provider:   Kate Sable, MD   Other Instructions      Signed, Kate Sable, MD  04/22/2020 12:54 PM    [place]

## 2020-04-22 NOTE — Patient Instructions (Signed)
Medication Instructions:  Your physician recommends that you continue on your current medications as directed. Please refer to the Current Medication list given to you today.  *If you need a refill on your cardiac medications before your next appointment, please call your pharmacy*   Lab Work: None Ordered If you have labs (blood work) drawn today and your tests are completely normal, you will receive your results only by: [name] MyChart Message (if you have MyChart) OR . A paper copy in the mail If you have any lab test that is abnormal or we need to change your treatment, we will call you to review the results.   Testing/Procedures: None Ordered   Follow-Up: At [facility], you and your health needs are our priority.  As part of our continuing mission to provide you with exceptional heart care, we have created designated Provider Care Teams.  These Care Teams include your primary Cardiologist (physician) and Advanced Practice Providers (APPs -  Physician Assistants and Nurse Practitioners) who all work together to provide you with the care you need, when you need it.  We recommend signing up for the patient portal called "MyChart".  Sign up information is provided on this After Visit Summary.  MyChart is used to connect with patients for Virtual Visits (Telemedicine).  Patients are able to view lab/test results, encounter notes, upcoming appointments, etc.  Non-urgent messages can be sent to your provider as well.   To learn more about what you can do with MyChart, go to [email or web address].    Your next appointment:   6 month(s)  The format for your next appointment:   In Person  Provider:   Kate Sable, MD   Other Instructions

## 2020-06-06 ENCOUNTER — Ambulatory Visit: Payer: 59 | Admitting: Dermatology

## 2020-06-07 ENCOUNTER — Ambulatory Visit: Payer: 59 | Admitting: Gastroenterology

## 2020-06-07 ENCOUNTER — Encounter: Payer: Self-pay | Admitting: Gastroenterology

## 2020-06-07 ENCOUNTER — Other Ambulatory Visit: Payer: Self-pay

## 2020-06-07 VITALS — BP 170/112 | HR 70 | Temp 97.7°F | Wt 208.4 lb

## 2020-06-07 DIAGNOSIS — R748 Abnormal levels of other serum enzymes: Secondary | ICD-10-CM

## 2020-06-07 NOTE — Progress Notes (Signed)
Cephas Darby, MD [address]  [place]  [place], [place] [address]  Main: [phone]  Fax: [phone]    Gastroenterology Consultation  Referring Provider:     Steele Sizer, MD Primary Care Physician:  Steele Sizer, MD Primary Gastroenterologist:  Dr. Cephas Darby Reason for Consultation:     Elevated LFTs        HPI:   Victor Valdez is a 55 y.o. male referred by Dr. Steele Sizer, MD  for consultation & management of elevated LFTs.  Patient is first noted to have elevated LFTs, predominantly transaminases since 01/2017.  AST forty-eight, ALT fifty-one, rest of the liver enzymes were normal.  His LFTs peaked at AST 215, ALT 190 in 07/2018.  Most recently in November 2021, AST 125, ALT 176.  Patient attributes his elevated liver enzymes secondary to heavy alcohol use.  He said he quit drinking 3 months ago.  Prior to this, during the pandemic, he picked up on drinking scotch, about a gallon that would last for 3 to 4 days.  He self realized about heavy drinking and decided to quit.  He also started new medication for ADD, Strattera switching from Adderall in November 2021.  He takes Restoril as needed for insomnia.  HCV antibody negative, HbA1c normal, ultrasound abdomen normal, PTH, TSH normal  He does not smoke He is an attorney for [employer]  NSAIDs: None  Antiplts/Anticoagulants/Anti thrombotics: None  GI Procedures: Colonoscopy 10/08/2016 for screening by Dr. Gustavo Lah - Diverticulosis in the sigmoid colon. - Non-bleeding internal hemorrhoids. - The examination was otherwise normal. - No specimens collected.  Past Medical History:  Diagnosis Date  . Adult attention deficit disorder   . Allergy   . Basal cell carcinoma    R shoulder per pt   . Basal cell epithelioma, face    Dr. Doristine Johns, R eye  . Hyperglycemia   . Hyperlipidemia   . Hypertension   . Snoring     Past Surgical History:  Procedure Laterality Date  . COLONOSCOPY WITH PROPOFOL  N/A 10/08/2016   Procedure: COLONOSCOPY WITH PROPOFOL;  Surgeon: Lollie Sails, MD;  Location[facility] ENDOSCOPY;  Service: Endoscopy;  Laterality: N/A;  . EYE SURGERY Bilateral    lasik  . LASIK Bilateral 12.30.2015    Current Outpatient Medications:  .  atenolol (TENORMIN) 25 MG tablet, Take 1 tablet (25 mg total) by mouth every evening., Disp: 90 tablet, Rfl: 1 .  atomoxetine (STRATTERA) 25 MG capsule, Take 1 capsule (25 mg total) by mouth 2 (two) times daily with a meal., Disp: 60 capsule, Rfl: 0 .  clindamycin (CLEOCIN-T) 1 % lotion, Apply topically as directed. qd-bid to chest and chin, Disp: 60 mL, Rfl: 3 .  fluticasone (FLONASE) 50 MCG/ACT nasal spray, 1 spray into each nostril daily., Disp: , Rfl:  .  losartan (COZAAR) 25 MG tablet, Take 1 tablet (25 mg total) by mouth daily., Disp: 30 tablet, Rfl: 1 .  temazepam (RESTORIL) 15 MG capsule, Take 1 capsule (15 mg total) by mouth at bedtime as needed for sleep., Disp: 30 capsule, Rfl: 1   Family History  Problem Relation Age of Onset  . Hypertension Mother   . Cancer Father 60       prostate cancer  . Hypertension Sister   . Obesity Sister      Social History   Tobacco Use  . Smoking status: Never Smoker  . Smokeless tobacco: Former Network engineer  . Vaping Use:  Never used  Substance Use Topics  . Alcohol use: Yes    Alcohol/week: 0.0 standard drinks  . Drug use: No    Allergies as of 06/07/2020 - Review Complete 06/07/2020  Allergen Reaction Noted  . Aspirin Anaphylaxis 11/12/2014  . Nsaids  07/17/2017  . Penicillins  11/12/2014    Review of Systems:    All systems reviewed and negative except where noted in HPI.   Physical Exam:  BP (!) 170/112 (BP Location: Left Arm, Patient Position: Sitting, Cuff Size: Normal)   Pulse 70   Temp 97.7 F (36.5 C) (Oral)   Wt 208 lb 6 oz (94.5 kg)   BMI 29.90 kg/m  No LMP for male patient.  General:   Alert,  Well-developed, well-nourished, pleasant and cooperative  in NAD Head:  Normocephalic and atraumatic. Eyes:  Sclera clear, no icterus.   Conjunctiva pink. Ears:  Normal auditory acuity. Nose:  No deformity, discharge, or lesions. Mouth:  No deformity or lesions,oropharynx pink & moist. Neck:  Supple; no masses or thyromegaly. Lungs:  Respirations even and unlabored.  Clear throughout to auscultation.   No wheezes, crackles, or rhonchi. No acute distress. Heart:  Regular rate and rhythm; no murmurs, clicks, rubs, or gallops. Abdomen:  Normal bowel sounds. Soft, non-tender and non-distended without masses, hepatosplenomegaly or hernias noted.  No guarding or rebound tenderness.   Rectal: Not performed Msk:  Symmetrical without gross deformities. Good, equal movement & strength bilaterally. Pulses:  Normal pulses noted. Extremities:  No clubbing or edema.  No cyanosis. Neurologic:  Alert and oriented x3;  grossly normal neurologically. Skin:  Intact without significant lesions or rashes. No jaundice. Psych:  Alert and cooperative. Normal mood and affect.  Imaging Studies: Reviewed  Assessment and Plan:   Victor Valdez is a 55 y.o. pleasant Caucasian male with history of hypertension, hyperlipidemia, ADD is seen in consultation for elevated liver enzymes  Elevated AST/ALT Likely secondary to alcohol use HCV antibody negative, TSH, PTH, HbA1c normal Recheck LFTs today, if persistently elevated, recommend secondary liver disease work-up Strattera can also lead to elevated transaminases, will discuss about discontinuation of this medication based on repeat LFTs  Follow up in 2 to 3 months   Cephas Darby, MD

## 2020-06-09 ENCOUNTER — Other Ambulatory Visit: Payer: Self-pay | Admitting: Gastroenterology

## 2020-06-09 ENCOUNTER — Telehealth: Payer: Self-pay

## 2020-06-09 DIAGNOSIS — R748 Abnormal levels of other serum enzymes: Secondary | ICD-10-CM

## 2020-06-09 LAB — HEPATIC FUNCTION PANEL
ALT: 204 IU/L — ABNORMAL HIGH (ref 0–44)
AST: 90 IU/L — ABNORMAL HIGH (ref 0–40)
Albumin: 4.3 g/dL (ref 3.8–4.9)
Alkaline Phosphatase: 99 IU/L (ref 44–121)
Bilirubin Total: 0.7 mg/dL (ref 0.0–1.2)
Bilirubin, Direct: 0.18 mg/dL (ref 0.00–0.40)
Total Protein: 7.3 g/dL (ref 6.0–8.5)

## 2020-06-09 NOTE — Telephone Encounter (Signed)
-----   Message from Lin Landsman, MD sent at 06/09/2020  1:08 PM EST ----- Dr. Ancil Boozer  I am suspecting if his elevated LFTs are secondary to Strattera.  Can we hold off this medication and recheck his LFTs in 1 month.  If they are persistently elevated, I would like to pursue secondary liver disease work-up  Caryl Pina  Please go ahead and order ultrasound liver Dopplers and inform patient  Rohini Vanga

## 2020-06-09 NOTE — Telephone Encounter (Signed)
Released labs. Called and left a message for call back

## 2020-06-09 NOTE — Telephone Encounter (Signed)
Order the ultrasound but tried to call patient but patient answer then disconnected the phone. Called central scheduled and got patient scheduled 06/15/2020 at 8:30am please [place] 8[place]am [facility]. Nothing to eat or drink after midnight

## 2020-06-09 NOTE — Telephone Encounter (Signed)
Patient states he can not do the ultrasound on 06/15/2020. Gave him the number to call to get the ultrasound rescheduled. He states he will go get the lab work done

## 2020-06-09 NOTE — Telephone Encounter (Signed)
Sent mychart message

## 2020-06-09 NOTE — Telephone Encounter (Signed)
-----   Message from Lin Landsman, MD sent at 06/09/2020  2:06 PM EST ----- Regarding: Labs Caryl Pina  Please go ahead and release labs and let him know  RV

## 2020-06-10 NOTE — Progress Notes (Signed)
Name: Victor Valdez   MRN: [id number]    DOB: [birth year]   Date:06/13/2020       Progress Note  Subjective  Chief Complaint  Follow Up  HPI  ADD: we stopped Adderal because of episodes of narrow complex tachycardia and syncope that happened Nov 2021. He is now on Strattera , initially felt emotional but now feels fine. No palpitation, feels less jittery than on Adderal. He likes the current dose of medication   Elevated liver enzymes/Alcoholism in early remission: no alcohol since hospital stay Nov 2021 There is a family history of alcoholism - sister and father . He has been exercising instead of drinking , seen GI and is getting evaluated for causes of elevated LFT's. Denies nausea, vomiting, change in bowel movements , stools are normal in color   Snoring: he also has daily fatigue, recent episode of syncope. ESS of 10, we scheduled a sleep study but it was denied, he needs to have a home study   HTN:he is on beta-blocker and we started Losartan last visit, bp is very high today. He used to be on Tribenzor but stopped during hospital stay , he was sent home on Atenolol we added low dose Losartan 25 mg  Last visit but bp is very high today, we will switch to Benicar and titrate as tolerated  Insomnia: he is taking Temzepam, still waking up at 2 am but falls back asleep. Average sleep time on his watch  7.29   X-ray carotid showed calcification during recent visit to the dentist   Patient Active Problem List   Diagnosis Date Noted  . Mild dilation of ascending aorta (HCC) 04/04/2020  . Hepatic steatosis 04/04/2020  . Low serum parathyroid hormone (PTH) 04/04/2020  . Hypomagnesemia 04/04/2020  . Hypercalcemia 04/04/2020  . Narrow complex tachycardia ([place]) 03/30/2020  . Syncope 03/30/2020  . Internal hemorrhoids 10/23/2016  . Diverticulosis 10/23/2016  . ADD (attention deficit disorder) 11/13/2014  . Basal cell carcinoma of face 11/13/2014  . CD (contact dermatitis) 11/13/2014  .  Dyslipidemia 11/13/2014  . Blood glucose elevated 11/13/2014  . Benign hypertension 11/13/2014  . Perennial allergic rhinitis with seasonal variation 11/13/2014  . Allergic rhinitis 11/13/2014    Past Surgical History:  Procedure Laterality Date  . COLONOSCOPY WITH PROPOFOL N/A 10/08/2016   Procedure: COLONOSCOPY WITH PROPOFOL;  Surgeon: Lollie Sails, MD;  [facility] END[facility]COPY;  Service: Endoscopy;  Laterality: N/A;  . EYE SURGERY Bilateral    lasik  . LASIK Bilateral 12.30.2015    Family History  Problem Relation Age of Onset  . Hypertension Mother   . Cancer Father 41       prostate cancer  . Hypertension Sister   . Obesity Sister     Social History   Tobacco Use  . Smoking status: Never Smoker  . Smokeless tobacco: Former Network engineer Use Topics  . Alcohol use: Yes    Alcohol/week: 0.0 standard drinks     Current Outpatient Medications:  .  atenolol (TENORMIN) 25 MG tablet, Take 1 tablet (25 mg total) by mouth every evening., Disp: 90 tablet, Rfl: 1 .  clindamycin (CLEOCIN-T) 1 % lotion, Apply topically as directed. qd-bid to chest and chin, Disp: 60 mL, Rfl: 3 .  fluticasone (FLONASE) 50 MCG/ACT nasal spray, 1 spray into each nostril daily., Disp: , Rfl:  .  olmesartan (BENICAR) 20 MG tablet, Take 1 tablet (20 mg total) by mouth daily. In place of losartan, Disp: 90 tablet, Rfl: 0 .  atomoxetine (STRATTERA) 25 MG capsule, Take 1 capsule (25 mg total) by mouth 2 (two) times daily with a meal., Disp: 180 capsule, Rfl: 0 .  temazepam (RESTORIL) 15 MG capsule, Take 1 capsule (15 mg total) by mouth at bedtime as needed for sleep., Disp: 90 capsule, Rfl: 0  Allergies  Allergen Reactions  . Aspirin Anaphylaxis  . Nsaids   . Penicillins     I personally reviewed active problem list, medication list, allergies, family history, social history, health maintenance with the patient/caregiver today.   ROS  Constitutional: Negative for fever or weight change.   Respiratory: Negative for cough and shortness of breath.   Cardiovascular: Negative for chest pain or palpitations.  Gastrointestinal: Negative for abdominal pain, no bowel changes.  Musculoskeletal: Negative for gait problem or joint swelling.  Skin: Negative for rash.  Neurological: Negative for dizziness or headache.  No other specific complaints in a complete review of systems (except as listed in HPI above).  Objective  Vitals:   06/13/20 0833 06/13/20 0913  BP: (!) 168/100 (!) 168/92  Pulse: 72   Resp: 16   Temp: 97.7 F (36.5 C)   TempSrc: Oral   SpO2: 98%   Weight: 206 lb 3.2 oz (93.5 kg)   Height: 5' 10"  (1.778 m)     Body mass index is 29.59 kg/m.  Physical Exam  Constitutional: Patient appears well-developed and well-nourished. Overweight.  No distress.  HEENT: head atraumatic, normocephalic, pupils equal and reactive to light,  neck supple Cardiovascular: Normal rate, regular rhythm and normal heart sounds.  No murmur heard. No BLE edema. Pulmonary/Chest: Effort normal and breath sounds normal. No respiratory distress. Abdominal: Soft.  There is no tenderness. Psychiatric: Patient has a normal mood and affect. behavior is normal. Judgment and thought content normal.  Recent Results (from the past 2160 hour(s))  Comprehensive metabolic panel     Status: Abnormal   Collection Time: 04/04/20  2:21 PM  Result Value Ref Range   Glucose 116 (H) 65 - 99 mg/dL   BUN 12 6 - 24 mg/dL   Creatinine, Ser 1.03 0.76 - 1.27 mg/dL   GFR calc non Af Amer 83 >59 mL/min/1.73   GFR calc Af Amer 95 >59 mL/min/1.73    Comment: **In accordance with recommendations from the NKF-ASN Task force,**   [facility] is in the process of updating its eGFR calculation to the   2021 CKD-EPI creatinine equation that estimates kidney function   without a race variable.    BUN/Creatinine Ratio 12 9 - 20   Sodium 134 134 - 144 mmol/L   Potassium 5.2 3.5 - 5.2 mmol/L   Chloride 99 96 - 106  mmol/L   CO2 22 20 - 29 mmol/L   Calcium 9.4 8.7 - 10.2 mg/dL   Total Protein 7.2 6.0 - 8.5 g/dL   Albumin 4.4 3.8 - 4.9 g/dL   Globulin, Total 2.8 1.5 - 4.5 g/dL   Albumin/Globulin Ratio 1.6 1.2 - 2.2   Bilirubin Total 0.6 0.0 - 1.2 mg/dL   Alkaline Phosphatase 88 44 - 121 IU/L    Comment:               **Please note reference interval change**   AST 125 (H) 0 - 40 IU/L   ALT 176 (H) 0 - 44 IU/L  Hemoglobin A1c     Status: None   Collection Time: 04/04/20  2:21 PM  Result Value Ref Range   Hgb A1c MFr Bld 5.2 4.8 -  5.6 %    Comment:          Prediabetes: 5.7 - 6.4          Diabetes: >6.4          Glycemic control for adults with diabetes: <7.0    Est. average glucose Bld gHb Est-mCnc 103 mg/dL  Parathyroid hormone, intact (no Ca)     Status: None   Collection Time: 04/04/20  2:21 PM  Result Value Ref Range   PTH 48 15 - 65 pg/mL  Magnesium     Status: None   Collection Time: 04/04/20  2:21 PM  Result Value Ref Range   Magnesium 2.1 1.6 - 2.3 mg/dL  Lipid panel     Status: Abnormal   Collection Time: 04/04/20  2:21 PM  Result Value Ref Range   Cholesterol, Total 193 100 - 199 mg/dL   Triglycerides 82 0 - 149 mg/dL   HDL 62 >39 mg/dL   VLDL Cholesterol Cal 15 5 - 40 mg/dL   LDL Chol Calc (NIH) 116 (H) 0 - 99 mg/dL   Chol/HDL Ratio 3.1 0.0 - 5.0 ratio    Comment:                                   T. Chol/HDL Ratio                                             Men  Women                               1/2 Avg.Risk  3.4    3.3                                   Avg.Risk  5.0    4.4                                2X Avg.Risk  9.6    7.1                                3X Avg.Risk 23.4   11.0   Hepatic function panel     Status: Abnormal   Collection Time: 06/08/20 11:42 AM  Result Value Ref Range   Total Protein 7.3 6.0 - 8.5 g/dL   Albumin 4.3 3.8 - 4.9 g/dL   Bilirubin Total 0.7 0.0 - 1.2 mg/dL   Bilirubin, Direct 0.18 0.00 - 0.40 mg/dL   Alkaline Phosphatase 99 44 - 121  IU/L   AST 90 (H) 0 - 40 IU/L   ALT 204 (H) 0 - 44 IU/L      PHQ2/9: Depression screen [facility] 2/9 06/13/2020 04/21/2020 04/04/2020 12/31/2019 09/30/2019  Decreased Interest 0 2 1 0 0  Down, Depressed, Hopeless 0 1 2 0 0  PHQ - 2 Score 0 3 3 0 0  Altered sleeping 1 1 3  0 0  Tired, decreased energy 0 0 1 0 0  Change in appetite 0 0 2 0 0  Feeling bad or failure about yourself  0 0 3 0 0  Trouble concentrating 1 1 3  0 0  Moving slowly or fidgety/restless 0 0 0 0 0  Suicidal thoughts 0 0 0 0 0  PHQ-9 Score 2 5 15  0 0  Difficult doing work/chores Not difficult at all Somewhat difficult - - -  Some recent data might be hidden    phq 9 is negative   Fall Risk: Fall Risk  06/13/2020 04/21/2020 04/04/2020 12/31/2019 10/30/2019  Falls in the past year? 1 1 1  0 1  Number falls in past yr: 0 0 0 0 0  Injury with Fall? 1 1 1  0 1  Risk for fall due to : - History of fall(s) - - -  Follow up - Falls prevention discussed - - -     Functional Status Survey: Is the patient deaf or have difficulty hearing?: No Does the patient have difficulty seeing, even when wearing glasses/contacts?: No Does the patient have difficulty concentrating, remembering, or making decisions?: Yes Does the patient have difficulty walking or climbing stairs?: No Does the patient have difficulty dressing or bathing?: No Does the patient have difficulty doing errands alone such as visiting a doctor's office or shopping?: No    Assessment & Plan  1. Benign hypertension  - olmesartan (BENICAR) 20 MG tablet; Take 1 tablet (20 mg total) by mouth daily. In place of losartan  Dispense: 90 tablet; Refill: 0  2. Attention deficit hyperactivity disorder (ADHD), predominantly hyperactive type  - atomoxetine (STRATTERA) 25 MG capsule; Take 1 capsule (25 mg total) by mouth 2 (two) times daily with a meal.  Dispense: 180 capsule; Refill: 0  3. Elevated liver enzymes  Keep follow up with GI, reminded him to have labs done    4. Dyslipidemia   5. Vitamin D deficiency   6. History of syncope  No symptoms since Nov  7. Calcification of right carotid artery  - US Carotid Duplex Bilateral; Future  8. Insomnia, unspecified type  - temazepam (RESTORIL) 15 MG capsule; Take 1 capsule (15 mg total) by mouth at bedtime as needed for sleep.  Dispense: 90 capsule; Refill: 0

## 2020-06-13 ENCOUNTER — Other Ambulatory Visit: Payer: Self-pay

## 2020-06-13 ENCOUNTER — Encounter: Payer: Self-pay | Admitting: Family Medicine

## 2020-06-13 ENCOUNTER — Ambulatory Visit: Payer: 59 | Admitting: Family Medicine

## 2020-06-13 VITALS — BP 168/92 | HR 72 | Temp 97.7°F | Resp 16 | Ht 70.0 in | Wt 206.2 lb

## 2020-06-13 DIAGNOSIS — I10 Essential (primary) hypertension: Secondary | ICD-10-CM | POA: Diagnosis not present

## 2020-06-13 DIAGNOSIS — R748 Abnormal levels of other serum enzymes: Secondary | ICD-10-CM

## 2020-06-13 DIAGNOSIS — E559 Vitamin D deficiency, unspecified: Secondary | ICD-10-CM

## 2020-06-13 DIAGNOSIS — G4700 Insomnia, unspecified: Secondary | ICD-10-CM

## 2020-06-13 DIAGNOSIS — Z87898 Personal history of other specified conditions: Secondary | ICD-10-CM

## 2020-06-13 DIAGNOSIS — F901 Attention-deficit hyperactivity disorder, predominantly hyperactive type: Secondary | ICD-10-CM | POA: Diagnosis not present

## 2020-06-13 DIAGNOSIS — E785 Hyperlipidemia, unspecified: Secondary | ICD-10-CM

## 2020-06-13 DIAGNOSIS — I6521 Occlusion and stenosis of right carotid artery: Secondary | ICD-10-CM

## 2020-06-13 MED ORDER — TEMAZEPAM 15 MG PO CAPS: 15.0000 mg | ORAL_CAPSULE | Freq: Every evening | ORAL | 0 refills | Status: DC | PRN

## 2020-06-13 MED ORDER — OLMESARTAN MEDOXOMIL 20 MG PO TABS: 20.0000 mg | ORAL_TABLET | Freq: Every day | ORAL | 0 refills | Status: DC

## 2020-06-13 MED ORDER — ATOMOXETINE HCL 25 MG PO CAPS
25.0000 mg | ORAL_CAPSULE | Freq: Two times a day (BID) | ORAL | 0 refills | Status: DC
Start: 2020-06-13 — End: 2020-08-14

## 2020-06-13 NOTE — Patient Instructions (Signed)
Go up on Losartan from 25 mg ( 1 tablet ) to two daily and if bp still high can go up to 4 daily  ( max of 100 mg)  Once Olmasartan arrives stop Losartan and take one of the olmasartan 20 mg daily if still high may go up to 40 mg

## 2020-06-15 ENCOUNTER — Other Ambulatory Visit: Payer: Self-pay

## 2020-06-15 ENCOUNTER — Ambulatory Visit: Admission: RE | Admit: 2020-06-15 | Payer: 59 | Source: Ambulatory Visit

## 2020-06-15 DIAGNOSIS — R0683 Snoring: Secondary | ICD-10-CM

## 2020-06-15 DIAGNOSIS — G4700 Insomnia, unspecified: Secondary | ICD-10-CM

## 2020-06-21 ENCOUNTER — Ambulatory Visit: Payer: 59

## 2020-06-26 ENCOUNTER — Other Ambulatory Visit: Payer: Self-pay | Admitting: Family Medicine

## 2020-06-26 DIAGNOSIS — I10 Essential (primary) hypertension: Secondary | ICD-10-CM

## 2020-07-05 ENCOUNTER — Telehealth: Payer: Self-pay | Admitting: Gastroenterology

## 2020-07-05 ENCOUNTER — Telehealth: Payer: Self-pay

## 2020-07-05 ENCOUNTER — Ambulatory Visit: Payer: 59 | Attending: Gastroenterology

## 2020-07-05 DIAGNOSIS — I10 Essential (primary) hypertension: Secondary | ICD-10-CM

## 2020-07-05 DIAGNOSIS — I77810 Thoracic aortic ectasia: Secondary | ICD-10-CM

## 2020-07-05 DIAGNOSIS — I471 Supraventricular tachycardia: Secondary | ICD-10-CM

## 2020-07-05 NOTE — Telephone Encounter (Signed)
Kim from [place] called to let us know that this patient did not show for his scheduled [place] appointment today.

## 2020-07-05 NOTE — Telephone Encounter (Signed)
Copied from [place] [id number]. Topic: Referral - Question >> Jul 05, 2020  1:04 PM Pawlus, Brayton Layman A wrote: Reason for CRM: Pt stated she still has not heard anything regarding a referral needed to get a Calcium deposit out of his heart. Pt had some questions regarding getting this referral put in place. Please call back.   Please review and advise.

## 2020-07-06 NOTE — Telephone Encounter (Signed)
.   Type Date User Summary Attachment  General 04/08/2020 1:42 PM Clarisse Gouge - -  Note   Invalid number not in service           . Type Date User Summary Attachment  General 04/06/2020 10:03 AM Meriam Sprague B - -  Note   Attempted to schedule.  LMOV to call office.           It was sent to [facility] as you see above.    If you want to continue with this process a new referral must be placed.

## 2020-07-06 NOTE — Telephone Encounter (Signed)
New referral placed and call number on file and it worked and is correct number.

## 2020-07-25 ENCOUNTER — Other Ambulatory Visit: Payer: Self-pay | Admitting: Neurology

## 2020-07-25 ENCOUNTER — Other Ambulatory Visit (HOSPITAL_COMMUNITY): Payer: Self-pay | Admitting: Neurology

## 2020-07-25 DIAGNOSIS — R930 Abnormal findings on diagnostic imaging of skull and head, not elsewhere classified: Secondary | ICD-10-CM

## 2020-07-26 ENCOUNTER — Ambulatory Visit: Payer: 59 | Admitting: Cardiology

## 2020-07-26 ENCOUNTER — Other Ambulatory Visit: Payer: Self-pay

## 2020-07-26 ENCOUNTER — Encounter: Payer: Self-pay | Admitting: Cardiology

## 2020-07-26 VITALS — BP 108/60 | HR 72 | Ht 70.0 in | Wt 207.4 lb

## 2020-07-26 DIAGNOSIS — I471 Supraventricular tachycardia: Secondary | ICD-10-CM | POA: Diagnosis not present

## 2020-07-26 DIAGNOSIS — R55 Syncope and collapse: Secondary | ICD-10-CM | POA: Diagnosis not present

## 2020-07-26 DIAGNOSIS — I6529 Occlusion and stenosis of unspecified carotid artery: Secondary | ICD-10-CM | POA: Diagnosis not present

## 2020-07-26 DIAGNOSIS — I10 Essential (primary) hypertension: Secondary | ICD-10-CM

## 2020-07-26 NOTE — Patient Instructions (Signed)
Medication Instructions:   Your physician recommends that you continue on your current medications as directed. Please refer to the Current Medication list given to you today.  *If you need a refill on your cardiac medications before your next appointment, please call your pharmacy*   Lab Work: None ordered    Testing/Procedures:  1.  Your physician has requested that you have a carotid duplex. This test is an ultrasound of the carotid arteries in your neck. It looks at blood flow through these arteries that supply the brain with blood. Allow one hour for this exam. There are no restrictions or special instructions.    Follow-Up: At [facility], you and your health needs are our priority.  As part of our continuing mission to provide you with exceptional heart care, we have created designated Provider Care Teams.  These Care Teams include your primary Cardiologist (physician) and Advanced Practice Providers (APPs -  Physician Assistants and Nurse Practitioners) who all work together to provide you with the care you need, when you need it.  We recommend signing up for the patient portal called "MyChart".  Sign up information is provided on this After Visit Summary.  MyChart is used to connect with patients for Virtual Visits (Telemedicine).  Patients are able to view lab/test results, encounter notes, upcoming appointments, etc.  Non-urgent messages can be sent to your provider as well.   To learn more about what you can do with MyChart, go to [email or web address].    Your next appointment:   6 month(s)  The format for your next appointment:   In Person  Provider:   Kate Sable, MD   Other Instructions

## 2020-07-26 NOTE — Progress Notes (Signed)
Cardiology Office Note:    Date:  07/26/2020   ID:  Victor Valdez, DOB 08-Dec-1965, MRN [id number]  PCP:  Steele Sizer, MD  [facility] Cardiologist:  Kate Sable, MD  [place] Electrophysiologist:  None   Referring MD: Steele Sizer, MD   Chief Complaint  Patient presents with  . Other    Svt pt would like to discuss possible carotid calcium buildup. Meds reviewed verbally with pt.    History of Present Illness:    Victor Valdez is a 55 y.o. male with a hx of hypertension, ADHD who presents for follow-up.  He was last seen due to syncope while playing basketball.  He was evaluated in the ED, etiology for syncope at the time was deemed secondary to dehydration, electrolyte abnormalities.  Echo was normal.  SVT was noted which resolved after adenosine.  Patient started on atenolol.  Has not had any further episodes since.  Denies palpitations, shortness of breath.  Went for a dental exam where x-rays of the necks were obtained.  Carotid artery calcifications noted.  Patient advised to follow-up.  He is allergic to aspirin.  Otherwise has no other concerns at this time.   Prior notes  In the ED, EKG initially showed sinus rhythm.  Later on developed SVT.  Vagal maneuvers were tried without success.  He was given adenosine with resolution of symptoms.  He had electrolyte abnormalities with hypocalcemia, low magnesium.  He was managed with IV fluids, electrolytes were repleted.   echocardiogram 2021 showed preserved ejection fraction, EF greater than 55%, LV normal size and wall thickness.  On discharge, it was recommended he switch Adderall.  Etiology of his syncope was determined to be a combination of dehydration, orthostasis, electrolyte abnormality .  His Adderall and HCTZ was stopped on discharge.      Past Medical History:  Diagnosis Date  . Adult attention deficit disorder   . Allergy   . Basal cell carcinoma    R shoulder per pt   . Basal cell  epithelioma, face    Dr. Doristine Johns, R eye  . Dysplastic nevus 04/20/2019   Left superior buttocks/posterior waistline. Mild to moderate atypia, margins free.  [name] Hyperglycemia   . Hyperlipidemia   . Hypertension   . Snoring     Past Surgical History:  Procedure Laterality Date  . COLONOSCOPY WITH PROPOFOL N/A 10/08/2016   Procedure: COLONOSCOPY WITH PROPOFOL;  Surgeon: Lollie Sails, MD;  Location: [facility] ENDOSCOPY;  Service: Endoscopy;  Laterality: N/A;  . EYE SURGERY Bilateral    lasik  . LASIK Bilateral 12.30.2015    Current Medications: Current Meds  Medication Sig  . atenolol (TENORMIN) 25 MG tablet TAKE 1 TABLET BY MOUTH IN  THE EVENING  . atomoxetine (STRATTERA) 25 MG capsule Take 1 capsule (25 mg total) by mouth 2 (two) times daily with a meal.  . clindamycin (CLEOCIN-T) 1 % lotion Apply topically as directed. qd-bid to chest and chin  . fluticasone (FLONASE) 50 MCG/ACT nasal spray 1 spray into each nostril daily.  [name] olmesartan (BENICAR) 20 MG tablet Take 1 tablet (20 mg total) by mouth daily. In place of losartan  . temazepam (RESTORIL) 15 MG capsule Take 1 capsule (15 mg total) by mouth at bedtime as needed for sleep.     Allergies:   Aspirin, Nsaids, and Penicillins   Social History   Socioeconomic History  . Marital status: Married    Spouse name: Nunzio Cory  . Number of children: 2  .  Years of education: Not on file  . Highest education level: Master's degree (e.g., MA, MS, MEng, MEd, MSW, MBA)  Occupational History  . Not on file  Tobacco Use  . Smoking status: Never Smoker  . Smokeless tobacco: Former Network engineer  . Vaping Use: Never used  Substance and Sexual Activity  . Alcohol use: Yes    Alcohol/week: 0.0 standard drinks  . Drug use: No  . Sexual activity: Yes    Partners: Female    Birth control/protection: None  Other Topics Concern  . Not on file  Social History Narrative  . Not on file   Social Determinants of Health    Financial Resource Strain: Low Risk   . Difficulty of Paying Living Expenses: Not hard at all  Food Insecurity: No Food Insecurity  . Worried About Charity fundraiser in the Last Year: Never true  . Ran Out of Food in the Last Year: Never true  Transportation Needs: No Transportation Needs  . Lack of Transportation (Medical): No  . Lack of Transportation (Non-Medical): No  Physical Activity: Sufficiently Active  . Days of Exercise per Week: 7 days  . Minutes of Exercise per Session: 50 min  Stress: No Stress Concern Present  . Feeling of Stress : Only a little  Social Connections: Socially Integrated  . Frequency of Communication with Friends and Family: More than three times a week  . Frequency of Social Gatherings with Friends and Family: More than three times a week  . Attends Religious Services: More than 4 times per year  . Active Member of Clubs or Organizations: Yes  . Attends Archivist Meetings: More than 4 times per year  . Marital Status: Married     Family History: The patient's family history includes Cancer (age of onset: 38) in his father; Hypertension in his mother and sister; Obesity in his sister.  ROS:   Please see the history of present illness.     All other systems reviewed and are negative.  EKGs/Labs/Other Studies Reviewed:    The following studies were reviewed today:   EKG:  EKG is  ordered today.  The ekg ordered today demonstrates normal sinus rhythm, normal ECG.  Recent Labs: 04/04/2020: BUN 12; Creatinine, Ser 1.03; Magnesium 2.1; Potassium 5.2; Sodium 134 06/08/2020: ALT 204  Recent Lipid Panel    Component Value Date/Time   CHOL 193 04/04/2020 1421   TRIG 82 04/04/2020 1421   HDL 62 04/04/2020 1421   CHOLHDL 3.1 04/04/2020 1421   LDLCALC 116 (H) 04/04/2020 1421     Risk Assessment/Calculations:      Physical Exam:    VS:  BP 108/60 (BP Location: Right Arm, Patient Position: Sitting, Cuff Size: Normal)   Pulse 72    Ht 5\' 10"  (1.778 m)   Wt 207 lb 6 oz (94.1 kg)   SpO2 98%   BMI 29.76 kg/m     Wt Readings from Last 3 Encounters:  07/26/20 207 lb 6 oz (94.1 kg)  06/13/20 206 lb 3.2 oz (93.5 kg)  06/07/20 208 lb 6 oz (94.5 kg)     GEN:  Well nourished, well developed in no acute distress HEENT: Normal NECK: No JVD; No carotid bruits LYMPHATICS: No lymphadenopathy CARDIAC: RRR, no murmurs, rubs, gallops RESPIRATORY:  Clear to auscultation without rales, wheezing or rhonchi  ABDOMEN: Soft, non-tender, non-distended MUSCULOSKELETAL:  No edema; No deformity  SKIN: Warm and dry NEUROLOGIC:  Alert and oriented x 3 PSYCHIATRIC:  Normal affect   ASSESSMENT:    1. Syncope and collapse   2. SVT (supraventricular tachycardia) ([place])   3. Primary hypertension   4. Carotid artery calcification, unspecified laterality    PLAN:    In order of problems listed above:  1. Syncope and collapse, in the context of dehydration, electrolyte abnormalities.  Echo with no gross structural abnormalities.  HCTZ and Adderall withheld, recommend to continue holding.  No additional testing indicated at this time. 2. SVT, in the context of syncope, electrolyte abnormalities, dehydration.  No further episodes, continue atenolol.  Consider EP input/ablation consideration the symptoms recorded.. 3. Hypertension, BP.  Continue Benicar, atenolol. 4. Carotid artery calcifications noted on dental x-ray.  Obtain carotid artery ultrasound bilaterally.  If significant stenosis is noted, will refer to vascular surgery.  Based on results, will consider Plavix and statin.  Patient is allergic to aspirin.  Follow-up in 6 months.       Medication Adjustments/Labs and Tests Ordered: Current medicines are reviewed at length with the patient today.  Concerns regarding medicines are outlined above.  Orders Placed This Encounter  Procedures  . VAS US CAROTID   No orders of the defined types were placed in this  encounter.   Patient Instructions  Medication Instructions:   Your physician recommends that you continue on your current medications as directed. Please refer to the Current Medication list given to you today.  *If you need a refill on your cardiac medications before your next appointment, please call your pharmacy*   Lab Work: None ordered    Testing/Procedures:  1.  Your physician has requested that you have a carotid duplex. This test is an ultrasound of the carotid arteries in your neck. It looks at blood flow through these arteries that supply the brain with blood. Allow one hour for this exam. There are no restrictions or special instructions.    Follow-Up: At [facility], you and your health needs are our priority.  As part of our continuing mission to provide you with exceptional heart care, we have created designated Provider Care Teams.  These Care Teams include your primary Cardiologist (physician) and Advanced Practice Providers (APPs -  Physician Assistants and Nurse Practitioners) who all work together to provide you with the care you need, when you need it.  We recommend signing up for the patient portal called "MyChart".  Sign up information is provided on this After Visit Summary.  MyChart is used to connect with patients for Virtual Visits (Telemedicine).  Patients are able to view lab/test results, encounter notes, upcoming appointments, etc.  Non-urgent messages can be sent to your provider as well.   To learn more about what you can do with MyChart, go to [email or web address].    Your next appointment:   6 month(s)  The format for your next appointment:   In Person  Provider:   Kate Sable, MD   Other Instructions      Signed, Kate Sable, MD  07/26/2020 10:05 AM    [place]

## 2020-08-01 ENCOUNTER — Ambulatory Visit: Payer: 59 | Admitting: Dermatology

## 2020-08-02 NOTE — Addendum Note (Signed)
Addended by: Britt Bottom on: 08/02/2020 07:21 AM   Modules accepted: Orders

## 2020-08-04 ENCOUNTER — Ambulatory Visit: Payer: 59 | Admitting: Dermatology

## 2020-08-05 ENCOUNTER — Encounter (HOSPITAL_COMMUNITY): Payer: Self-pay

## 2020-08-05 ENCOUNTER — Ambulatory Visit (HOSPITAL_COMMUNITY): Payer: 59

## 2020-08-12 ENCOUNTER — Ambulatory Visit (HOSPITAL_COMMUNITY)
Admission: RE | Admit: 2020-08-12 | Discharge: 2020-08-12 | Disposition: A | Discharge status: Home / Self Care | Payer: 59 | Source: Ambulatory Visit | Attending: Gastroenterology | Admitting: Gastroenterology

## 2020-08-12 ENCOUNTER — Encounter (HOSPITAL_COMMUNITY): Payer: Self-pay | Admitting: *Deleted

## 2020-08-12 ENCOUNTER — Other Ambulatory Visit: Payer: Self-pay

## 2020-08-12 ENCOUNTER — Ambulatory Visit (HOSPITAL_COMMUNITY)
Admission: RE | Admit: 2020-08-12 | Discharge: 2020-08-12 | Disposition: A | Discharge status: Home / Self Care | Payer: 59 | Source: Ambulatory Visit | Attending: Neurology | Admitting: Neurology

## 2020-08-12 ENCOUNTER — Encounter (HOSPITAL_COMMUNITY): Payer: Self-pay

## 2020-08-12 DIAGNOSIS — R930 Abnormal findings on diagnostic imaging of skull and head, not elsewhere classified: Secondary | ICD-10-CM | POA: Insufficient documentation

## 2020-08-12 DIAGNOSIS — R748 Abnormal levels of other serum enzymes: Secondary | ICD-10-CM

## 2020-08-12 MED ORDER — GADOBUTROL 1 MMOL/ML IV SOLN
9.5000 mL | Freq: Once | INTRAVENOUS | Status: AC | PRN
  Administered 2020-08-12: 9.5 mL via INTRAVENOUS

## 2020-08-14 ENCOUNTER — Other Ambulatory Visit: Payer: Self-pay | Admitting: Family Medicine

## 2020-08-14 DIAGNOSIS — F901 Attention-deficit hyperactivity disorder, predominantly hyperactive type: Secondary | ICD-10-CM

## 2020-08-14 DIAGNOSIS — I10 Essential (primary) hypertension: Secondary | ICD-10-CM

## 2020-08-14 NOTE — Telephone Encounter (Signed)
Requested medication (s) are due for refill today: -  Requested medication (s) are on the active medication list: yes  Last refill:  06/13/20  Future visit scheduled: yes  Notes to clinic:  med not delegated to NT to RF   Requested Prescriptions  Pending Prescriptions Disp Refills   atomoxetine (STRATTERA) 25 MG capsule [Pharmacy Med Name: ATOMOXETINE  25MG   CAP] 180 capsule     Sig: TAKE 1 CAPSULE BY MOUTH  TWICE DAILY WITH MEALS      Not Delegated - Psychiatry: Norepinephrine Reuptake Inhibitor Failed - 08/14/2020  4:27 AM      Failed - This refill cannot be delegated      Passed - Valid encounter within last 6 months    Recent Outpatient Visits           2 months ago Benign hypertension   [facility] Steele Sizer, MD   3 months ago Benign hypertension   [facility] [place], Drue Stager, MD   4 months ago SVT (supraventricular tachycardia) [facility])   [facility] Steele Sizer, MD   7 months ago Benign hypertension   [facility] [place], Drue Stager, MD   9 months ago Cervical radiculitis   [facility] [place], Drue Stager, MD       Future Appointments             In 1 week Vanga, Tally Due, MD [place]   In 1 week Ralene Bathe, MD [place]   In 4 weeks Steele Sizer, MD [facility], [place]   In 5 months Agbor-Etang, Aaron Edelman, MD [facility]              Refused Prescriptions Disp Refills   olmesartan (BENICAR) 20 MG tablet [Pharmacy Med Name: Olmesartan Medoxomil 20 MG Oral Tablet] 90 tablet     Sig: TAKE 1 TABLET BY MOUTH  DAILY      Cardiovascular:  Angiotensin Receptor Blockers Passed - 08/14/2020  4:27 AM      Passed - Cr in normal range and within 180 days    Creatinine, Ser  Date Value Ref Range Status  04/04/2020 1.03 0.76 - 1.27 mg/dL Final          Passed - K in normal range and  within 180 days    Potassium  Date Value Ref Range Status  04/04/2020 5.2 3.5 - 5.2 mmol/L Final          Passed - Patient is not pregnant      Passed - Last BP in normal range    BP Readings from Last 1 Encounters:  07/26/20 108/60          Passed - Valid encounter within last 6 months    Recent Outpatient Visits           2 months ago Benign hypertension   [facility] Steele Sizer, MD   3 months ago Benign hypertension   [facility] [place], Drue Stager, MD   4 months ago SVT (supraventricular tachycardia) [facility])   [facility] Steele Sizer, MD   7 months ago Benign hypertension   [facility] Steele Sizer, MD   9 months ago Cervical radiculitis   [facility] Steele Sizer, MD       Future Appointments             In 1 week Vanga,  Tally Due, MD [place]   In 1 week Ralene Bathe, MD [place]   In 4 weeks Steele Sizer, MD [facility], [place]   In 5 months Kate Sable, MD [facility], [place]

## 2020-08-14 NOTE — Telephone Encounter (Signed)
Requested Prescriptions  Pending Prescriptions Disp Refills  . olmesartan (BENICAR) 20 MG tablet [Pharmacy Med Name: Olmesartan Medoxomil 20 MG Oral Tablet] 90 tablet     Sig: TAKE 1 TABLET BY MOUTH  DAILY     Cardiovascular:  Angiotensin Receptor Blockers Passed - 08/14/2020  4:27 AM      Passed - Cr in normal range and within 180 days    Creatinine, Ser  Date Value Ref Range Status  04/04/2020 1.03 0.76 - 1.27 mg/dL Final         Passed - K in normal range and within 180 days    Potassium  Date Value Ref Range Status  04/04/2020 5.2 3.5 - 5.2 mmol/L Final         Passed - Patient is not pregnant      Passed - Last BP in normal range    BP Readings from Last 1 Encounters:  07/26/20 108/60         Passed - Valid encounter within last 6 months    Recent Outpatient Visits          2 months ago Benign hypertension   [facility] Steele Sizer, MD   3 months ago Benign hypertension   [facility] [place], Drue Stager, MD   4 months ago SVT (supraventricular tachycardia) [facility])   [facility] Steele Sizer, MD   7 months ago Benign hypertension   [facility] Steele Sizer, MD   9 months ago Cervical radiculitis   [facility] Sizer, MD      Future Appointments            In 1 week Vanga, Tally Due, MD [place]   In 1 week Ralene Bathe, MD [place]   In 4 weeks Steele Sizer, MD [facility], [place]   In 5 months Kate Sable, MD [facility]           . atomoxetine (STRATTERA) 25 MG capsule [Pharmacy Med Name: ATOMOXETINE  25MG   CAP] 180 capsule     Sig: TAKE 1 CAPSULE BY MOUTH  TWICE DAILY WITH MEALS     Not Delegated - Psychiatry: Norepinephrine Reuptake Inhibitor Failed - 08/14/2020  4:27 AM      Failed - This refill cannot be delegated      Passed - Valid encounter within  last 6 months    Recent Outpatient Visits          2 months ago Benign hypertension   [facility] Steele Sizer, MD   3 months ago Benign hypertension   [facility] [place], Drue Stager, MD   4 months ago SVT (supraventricular tachycardia) [facility])   [facility] Steele Sizer, MD   7 months ago Benign hypertension   [facility] Steele Sizer, MD   9 months ago Cervical radiculitis   [facility] Steele Sizer, MD      Future Appointments            In 1 week Vanga, Tally Due, MD [place]   In 1 week Ralene Bathe, MD [place]   In 4 weeks Steele Sizer, MD [facility], [place]   In 5 months Agbor-Etang, Aaron Edelman, MD [facility], [place]

## 2020-08-22 ENCOUNTER — Other Ambulatory Visit: Payer: Self-pay

## 2020-08-22 ENCOUNTER — Ambulatory Visit (INDEPENDENT_AMBULATORY_CARE_PROVIDER_SITE_OTHER): Payer: 59

## 2020-08-22 DIAGNOSIS — I6529 Occlusion and stenosis of unspecified carotid artery: Secondary | ICD-10-CM | POA: Diagnosis not present

## 2020-08-23 ENCOUNTER — Telehealth: Payer: 59 | Admitting: Gastroenterology

## 2020-08-24 ENCOUNTER — Other Ambulatory Visit: Payer: Self-pay

## 2020-08-24 ENCOUNTER — Encounter: Payer: Self-pay | Admitting: Dermatology

## 2020-08-24 ENCOUNTER — Ambulatory Visit: Payer: 59 | Admitting: Dermatology

## 2020-08-24 DIAGNOSIS — L821 Other seborrheic keratosis: Secondary | ICD-10-CM | POA: Diagnosis not present

## 2020-08-24 DIAGNOSIS — L719 Rosacea, unspecified: Secondary | ICD-10-CM

## 2020-08-24 NOTE — Progress Notes (Signed)
   Follow-Up Visit   Subjective  Victor Valdez is a 55 y.o. male who presents for the following: Acne (Of the chin and chest - improved with Clindamycin lotion ) and ISK  (Bx proven on the R upper eyelid - resolved, and the L cheek tx with LN2 - resolved).  The following portions of the chart were reviewed this encounter and updated as appropriate:   Tobacco  Allergies  Meds  Problems  Med Hx  Surg Hx  Fam Hx     Review of Systems:  No other skin or systemic complaints except as noted in HPI or Assessment and Plan.  Objective  Well appearing patient in no apparent distress; mood and affect are within normal limits.  A focused examination was performed including the face and hands. Relevant physical exam findings are noted in the Assessment and Plan.  Objective  Face: Dilated vessels on the cheeks, chin, and nose.  Assessment & Plan  Rosacea Face /Acne -  Discussed BBL laser treatment.  Rosacea is a chronic progressive skin condition usually affecting the face of adults, causing redness and/or acne bumps. It is treatable but not curable. It sometimes affects the eyes (ocular rosacea) as well. It may respond to topical and/or systemic medication and can flare with stress, sun exposure, alcohol, exercise and some foods.  Daily application of broad spectrum spf 30+ sunscreen to face is recommended to reduce flares.  Continue Clindamycin 1% lotion to face QD.  Consider Skin Medicinals Triple cream if not improving with Clindamycin lotion.  Seborrheic Keratoses - clear after tx - Stuck-on, waxy, tan-brown papules and/or plaques  - Benign-appearing - Discussed benign etiology and prognosis. - Observe - Call for any changes  Return in about 6 months (around 02/23/2021) for TBSE - hx dysplastic nevi, BCC .  Luther Redo, CMA, am acting as scribe for Sarina Ser, MD .  Documentation: I have reviewed the above documentation for accuracy and completeness, and I agree with  the above.  Sarina Ser, MD

## 2020-08-24 NOTE — Patient Instructions (Signed)

## 2020-08-26 ENCOUNTER — Encounter: Payer: Self-pay | Admitting: Dermatology

## 2020-09-09 NOTE — Progress Notes (Signed)
Name: Victor Valdez   MRN: [id number]    DOB: [birth year]   Date:09/12/2020       Progress Note  Subjective  Chief Complaint  Follow Up  HPI  ADD: we stopped Adderal because of episodes of narrow complex tachycardia and syncope that happened Nov 2021. He is now on Strattera , initially felt emotional but now feels fine. No palpitation, he is now feeling jittery again, anxious. He also has noticed that he falls asleep at 9 pm but wakes up around 2 am and has difficulty falling back asleep. We will change dose of Strattera to 40 mg and take only in the mornings. If he feels weird we can try 25 mg in am and 25 at lunch   Elevated liver enzymes/Alcoholism in early remission: no alcohol since hospital stay Nov 2021 There is a family history of alcoholism - sister and father . He has been exercising instead of drinking , seen GI but did not go get his labs done , advised to go to [place] today, we re-printed the orders, he has follow up in June . Denies nausea, vomiting, change in bowel movements , stools are normal in color   Snoring: he also has daily fatigue, recent episode of syncope. ESS of 10, we scheduled a sleep study but it was denied, he needs to have a home study , he states he has contacted them but it was not scheduled yet   HTN:he is on beta-blocker and Benicar  He used to be on Tribenzor but stopped during hospital stay. He denies chest pain or palpitation   Insomnia: he is taking Temzepam, still waking up at 2 am but falls back asleep. We will add Melatonin before bed, also changing Strattera to the morning only and monitoring    X-ray carotid showed calcification during recent visit to the dentist, but doppler was negative   History of syncope and SVT: seeing cardiologist, on Atenolol, no problems in months   Alcoholism: in remission since admitted to [facility] after syncope episode Fall 2021   Anxiety: he feels jittery, he states always worried and anxious, he is willing to try  medication, we will try lexapro, discussed possible side effects of medication  Patient Active Problem List   Diagnosis Date Noted  . Mild dilation of ascending aorta (HCC) 04/04/2020  . Hepatic steatosis 04/04/2020  . Low serum parathyroid hormone (PTH) 04/04/2020  . Hypomagnesemia 04/04/2020  . Hypercalcemia 04/04/2020  . Narrow complex tachycardia ([place]) 03/30/2020  . Syncope 03/30/2020  . Internal hemorrhoids 10/23/2016  . Diverticulosis 10/23/2016  . ADD (attention deficit disorder) 11/13/2014  . Basal cell carcinoma of face 11/13/2014  . CD (contact dermatitis) 11/13/2014  . Dyslipidemia 11/13/2014  . Blood glucose elevated 11/13/2014  . Benign hypertension 11/13/2014  . Perennial allergic rhinitis with seasonal variation 11/13/2014  . Allergic rhinitis 11/13/2014    Past Surgical History:  Procedure Laterality Date  . COLONOSCOPY WITH PROPOFOL N/A 10/08/2016   Procedure: COLONOSCOPY WITH PROPOFOL;  Surgeon: Lollie Sails, MD;  Location: [facility] ENDOSCOPY;  Service: Endoscopy;  Laterality: N/A;  . EYE SURGERY Bilateral    lasik  . LASIK Bilateral 12.30.2015    Family History  Problem Relation Age of Onset  . Hypertension Mother   . Cancer Father 44       prostate cancer  . Hypertension Sister   . Obesity Sister     Social History   Tobacco Use  . Smoking status: Never Smoker  . Smokeless tobacco:  Former Systems developer  Substance Use Topics  . Alcohol use: Yes    Alcohol/week: 0.0 standard drinks     Current Outpatient Medications:  .  atenolol (TENORMIN) 25 MG tablet, TAKE 1 TABLET BY MOUTH IN  THE EVENING, Disp: 90 tablet, Rfl: 0 .  atomoxetine (STRATTERA) 25 MG capsule, TAKE 1 CAPSULE BY MOUTH  TWICE DAILY WITH MEALS, Disp: 180 capsule, Rfl: 0 .  clindamycin (CLEOCIN-T) 1 % lotion, Apply topically as directed. qd-bid to chest and chin, Disp: 60 mL, Rfl: 3 .  fluticasone (FLONASE) 50 MCG/ACT nasal spray, 1 spray into each nostril daily., Disp: , Rfl:  .   olmesartan (BENICAR) 20 MG tablet, Take 1 tablet (20 mg total) by mouth daily. In place of losartan, Disp: 90 tablet, Rfl: 0 .  temazepam (RESTORIL) 15 MG capsule, Take 1 capsule (15 mg total) by mouth at bedtime as needed for sleep., Disp: 90 capsule, Rfl: 0  Allergies  Allergen Reactions  . Aspirin Anaphylaxis  . Nsaids   . Penicillins     I personally reviewed active problem list, medication list, allergies, family history, social history, health maintenance with the patient/caregiver today.   ROS  Constitutional: Negative for fever or weight change.  Respiratory: Negative for cough and shortness of breath.   Cardiovascular: Negative for chest pain or palpitations.  Gastrointestinal: Negative for abdominal pain, no bowel changes.  Musculoskeletal: Negative for gait problem or joint swelling.  Skin: Negative for rash.  Neurological: Negative for dizziness or headache.  No other specific complaints in a complete review of systems (except as listed in HPI above).  Objective  Vitals:   09/12/20 0839  BP: 126/80  Pulse: 88  Resp: 16  Temp: 98.3 F (36.8 C)  TempSrc: Oral  SpO2: 97%  Weight: 207 lb (93.9 kg)  Height: 5\' 10"  (1.778 m)    Body mass index is 29.7 kg/m.  Physical Exam  Constitutional: Patient appears well-developed and well-nourished. Obesity  No distress.  HEENT: head atraumatic, normocephalic, pupils equal and reactive to light,  neck supple Cardiovascular: Normal rate, regular rhythm and normal heart sounds.  No murmur heard. No BLE edema. Pulmonary/Chest: Effort normal and breath sounds normal. No respiratory distress. Abdominal: Soft.  There is no tenderness. Psychiatric: Patient has a normal mood and affect. behavior is normal. Judgment and thought content normal.  PHQ2/9: Depression screen [facility] 2/9 09/12/2020 06/13/2020 04/21/2020 04/04/2020 12/31/2019  Decreased Interest 1 0 2 1 0  Down, Depressed, Hopeless 0 0 1 2 0  PHQ - 2 Score 1 0 3 3 0   Altered sleeping 3 1 1 3  0  Tired, decreased energy 0 0 0 1 0  Change in appetite 0 0 0 2 0  Feeling bad or failure about yourself  0 0 0 3 0  Trouble concentrating 0 1 1 3  0  Moving slowly or fidgety/restless 0 0 0 0 0  Suicidal thoughts 0 0 0 0 0  PHQ-9 Score 4 2 5 15  0  Difficult doing work/chores - Not difficult at all Somewhat difficult - -  Some recent data might be hidden    phq 9 is positive   Fall Risk: Fall Risk  09/12/2020 06/13/2020 04/21/2020 04/04/2020 12/31/2019  Falls in the past year? 1 1 1 1  0  Number falls in past yr: 0 0 0 0 0  Injury with Fall? 0 1 1 1  0  Risk for fall due to : - - History of fall(s) - -  Follow  up - - Falls prevention discussed - -    Functional Status Survey: Is the patient deaf or have difficulty hearing?: No Does the patient have difficulty seeing, even when wearing glasses/contacts?: No Does the patient have difficulty concentrating, remembering, or making decisions?: No Does the patient have difficulty walking or climbing stairs?: No Does the patient have difficulty dressing or bathing?: No Does the patient have difficulty doing errands alone such as visiting a doctor's office or shopping?: No    Assessment & Plan  1. SVT (supraventricular tachycardia) (HCC)  - atenolol (TENORMIN) 25 MG tablet; Take 1 tablet (25 mg total) by mouth every evening.  Dispense: 90 tablet; Refill: 0  2. Mild dilation of ascending aorta (HCC)   3. Benign hypertension  - olmesartan (BENICAR) 20 MG tablet; Take 1 tablet (20 mg total) by mouth daily. In place of losartan  Dispense: 90 tablet; Refill: 0 - atenolol (TENORMIN) 25 MG tablet; Take 1 tablet (25 mg total) by mouth every evening.  Dispense: 90 tablet; Refill: 0  4. Attention deficit hyperactivity disorder (ADHD), predominantly hyperactive type  - atomoxetine (STRATTERA) 40 MG capsule; Take 1 capsule (40 mg total) by mouth daily.  Dispense: 90 capsule; Refill: 0  5. Insomnia, unspecified  type  - temazepam (RESTORIL) 15 MG capsule; Take 1 capsule (15 mg total) by mouth at bedtime as needed for sleep.  Dispense: 90 capsule; Refill: 0 - Melatonin 5 MG CHEW; Chew 1 tablet by mouth daily.  Dispense: 30 tablet; Refill: 0  6. Dyslipidemia   7. Vitamin D deficiency  Advised taking vitamin D 1000 units daily   8. Alcoholism in remission ([place])   9. Anxiety  Discussed possible side effect  - escitalopram (LEXAPRO) 5 MG tablet; Take 1 tablet (5 mg total) by mouth daily.  Dispense: 90 tablet; Refill: 0

## 2020-09-12 ENCOUNTER — Encounter: Payer: Self-pay | Admitting: Family Medicine

## 2020-09-12 ENCOUNTER — Other Ambulatory Visit: Payer: Self-pay

## 2020-09-12 ENCOUNTER — Ambulatory Visit: Payer: 59 | Admitting: Family Medicine

## 2020-09-12 VITALS — BP 126/80 | HR 88 | Temp 98.3°F | Resp 16 | Ht 70.0 in | Wt 207.0 lb

## 2020-09-12 DIAGNOSIS — I77810 Thoracic aortic ectasia: Secondary | ICD-10-CM | POA: Diagnosis not present

## 2020-09-12 DIAGNOSIS — F419 Anxiety disorder, unspecified: Secondary | ICD-10-CM

## 2020-09-12 DIAGNOSIS — I10 Essential (primary) hypertension: Secondary | ICD-10-CM

## 2020-09-12 DIAGNOSIS — I471 Supraventricular tachycardia: Secondary | ICD-10-CM

## 2020-09-12 DIAGNOSIS — F901 Attention-deficit hyperactivity disorder, predominantly hyperactive type: Secondary | ICD-10-CM

## 2020-09-12 DIAGNOSIS — E785 Hyperlipidemia, unspecified: Secondary | ICD-10-CM

## 2020-09-12 DIAGNOSIS — F1021 Alcohol dependence, in remission: Secondary | ICD-10-CM

## 2020-09-12 DIAGNOSIS — G4700 Insomnia, unspecified: Secondary | ICD-10-CM

## 2020-09-12 DIAGNOSIS — E559 Vitamin D deficiency, unspecified: Secondary | ICD-10-CM

## 2020-09-12 DIAGNOSIS — R0683 Snoring: Secondary | ICD-10-CM

## 2020-09-12 MED ORDER — ESCITALOPRAM OXALATE 5 MG PO TABS: 5.0000 mg | ORAL_TABLET | Freq: Every day | ORAL | 0 refills | Status: DC

## 2020-09-12 MED ORDER — MELATONIN 5 MG PO CHEW
1.0000 | CHEWABLE_TABLET | Freq: Every day | ORAL | 0 refills | Status: DC
Start: 2020-09-12 — End: 2021-04-05

## 2020-09-12 MED ORDER — OLMESARTAN MEDOXOMIL 20 MG PO TABS: 20.0000 mg | ORAL_TABLET | Freq: Every day | ORAL | 0 refills | Status: DC

## 2020-09-12 MED ORDER — ATENOLOL 25 MG PO TABS: 25.0000 mg | ORAL_TABLET | Freq: Every evening | ORAL | 0 refills | Status: DC

## 2020-09-12 MED ORDER — ATOMOXETINE HCL 40 MG PO CAPS: 40.0000 mg | ORAL_CAPSULE | Freq: Every day | ORAL | 0 refills | Status: DC

## 2020-09-12 MED ORDER — TEMAZEPAM 15 MG PO CAPS: 15.0000 mg | ORAL_CAPSULE | Freq: Every evening | ORAL | 0 refills | Status: DC | PRN

## 2020-10-05 ENCOUNTER — Telehealth: Payer: 59 | Admitting: Gastroenterology

## 2020-10-21 ENCOUNTER — Ambulatory Visit: Payer: 59 | Admitting: Cardiology

## 2020-11-13 ENCOUNTER — Other Ambulatory Visit: Payer: Self-pay | Admitting: Family Medicine

## 2020-11-13 DIAGNOSIS — F901 Attention-deficit hyperactivity disorder, predominantly hyperactive type: Secondary | ICD-10-CM

## 2020-11-13 DIAGNOSIS — F419 Anxiety disorder, unspecified: Secondary | ICD-10-CM

## 2020-11-13 DIAGNOSIS — I10 Essential (primary) hypertension: Secondary | ICD-10-CM

## 2020-11-13 NOTE — Telephone Encounter (Signed)
Requested medication (s) are due for refill today: no  Requested medication (s) are on the active medication list: yes  Last refill:  09/12/20 #90   Future visit scheduled: yes  Notes to clinic:  med not delegated to NT to RF   Requested Prescriptions  Pending Prescriptions Disp Refills   atomoxetine (STRATTERA) 40 MG capsule [Pharmacy Med Name: ATOMOXETINE  40MG   CAP] 90 capsule     Sig: TAKE 1 CAPSULE BY MOUTH  DAILY      Not Delegated - Psychiatry: Norepinephrine Reuptake Inhibitor Failed - 11/13/2020  6:20 AM      Failed - This refill cannot be delegated      Passed - Valid encounter within last 6 months    Recent Outpatient Visits           2 months ago SVT (supraventricular tachycardia) ([place])   [facility] Steele Sizer, MD   5 months ago Benign hypertension   [facility] Sizer, MD   6 months ago Benign hypertension   [facility] [place], Drue Stager, MD   7 months ago SVT (supraventricular tachycardia) [facility])   [facility] Steele Sizer, MD   10 months ago Benign hypertension   [facility] Sizer, MD       Future Appointments             In 3 weeks Ancil Boozer, Drue Stager, MD [place]   In 2 months Agbor-Etang, Aaron Edelman, MD [facility], [facility]              Refused Prescriptions Disp Refills   escitalopram (LEXAPRO) 5 MG tablet [Pharmacy Med Name: Escitalopram Oxalate 5 MG Oral Tablet] 90 tablet     Sig: TAKE 1 TABLET BY MOUTH  DAILY      Psychiatry:  Antidepressants - SSRI Passed - 11/13/2020  6:20 AM      Passed - Valid encounter within last 6 months    Recent Outpatient Visits           2 months ago SVT (supraventricular tachycardia) [facility])   [facility]izer, MD   5 months ago Benign hypertension   [facility] Sizer, MD   6 months ago  Benign hypertension   [facility] [place], Drue Stager, MD   7 months ago SVT (supraventricular tachycardia) [facility])   [facility] Sizer, MD   10 months ago Benign hypertension   [facility] [place], Drue Stager, MD       Future Appointments             In 3 weeks Steele Sizer, MD [facility], [place]   In 2 months Agbor-Etang, Aaron Edelman, MD [place], [facility]               olmesartan (BENICAR) 20 MG tablet [Pharmacy Med Name: Olmesartan Medoxomil 20 MG Oral Tablet] 90 tablet     Sig: TAKE 1 TABLET BY MOUTH  DAILY      Cardiovascular:  Angiotensin Receptor Blockers Failed - 11/13/2020  6:20 AM      Failed - Cr in normal range and within 180 days    Creatinine, Ser  Date Value Ref Range Status  04/04/2020 1.03 0.76 - 1.27 mg/dL Final          Failed - K in normal range and within 180 days    Potassium  Date Value Ref Range Status  04/04/2020 5.2 3.5 - 5.2 mmol/L Final          Passed - Patient is not pregnant      Passed - Last BP in normal range    BP Readings from Last 1 Encounters:  09/12/20 126/80          Passed - Valid encounter within last 6 months    Recent Outpatient Visits           2 months ago SVT (supraventricular tachycardia) [facility])   [facility]izer, MD   5 months ago Benign hypertension   [facility]izer, MD   6 months ago Benign hypertension   [facility] [place], Drue Stager, MD   7 months ago SVT (supraventricular tachycardia) [facility])   [facility] Steele Sizer, MD   10 months ago Benign hypertension   [facility]izer, MD       Future Appointments             In 3 weeks Steele Sizer, MD [facility], [place]   In 2 months Agbor-Etang, Aaron Edelman, MD [facility], [place]

## 2020-11-13 NOTE — Telephone Encounter (Signed)
Requested Prescriptions  Pending Prescriptions Disp Refills  . escitalopram (LEXAPRO) 5 MG tablet [Pharmacy Med Name: Escitalopram Oxalate 5 MG Oral Tablet] 90 tablet     Sig: TAKE 1 TABLET BY MOUTH  DAILY     Psychiatry:  Antidepressants - SSRI Passed - 11/13/2020  6:20 AM      Passed - Valid encounter within last 6 months    Recent Outpatient Visits          2 months ago SVT (supraventricular tachycardia) [facility])   [facility]izer, MD   5 months ago Benign hypertension   [facility]izer, MD   6 months ago Benign hypertension   [facility] [place], Drue Stager, MD   7 months ago SVT (supraventricular tachycardia) [facility])   [facility] Sizer, MD   10 months ago Benign hypertension   [facility]izer, MD      Future Appointments            In 3 weeks Steele Sizer, MD [facility], [place]   In 2 months Kate Sable, MD [facility], [facility]           . atomoxetine (STRATTERA) 40 MG capsule [Pharmacy Med Name: ATOMOXETINE  40MG   CAP] 90 capsule     Sig: TAKE 1 CAPSULE BY MOUTH  DAILY     Not Delegated - Psychiatry: Norepinephrine Reuptake Inhibitor Failed - 11/13/2020  6:20 AM      Failed - This refill cannot be delegated      Passed - Valid encounter within last 6 months    Recent Outpatient Visits          2 months ago SVT (supraventricular tachycardia) [facility])   [facility]izer, MD   5 months ago Benign hypertension   [facility]izer, MD   6 months ago Benign hypertension   [facility] [place], Drue Stager, MD   7 months ago SVT (supraventricular tachycardia) [facility])   [facility] MD   10 months ago Benign hypertension   [facility]izer, MD       Future Appointments            In 3 weeks Steele Sizer, MD [facility], [place]   In 2 months Kate Sable, MD [facility], [facility]           . olmesartan (BENICAR) 20 MG tablet [Pharmacy Med Name: Olmesartan Medoxomil 20 MG Oral Tablet] 90 tablet     Sig: TAKE 1 TABLET BY MOUTH  DAILY     Cardiovascular:  Angiotensin Receptor Blockers Failed - 11/13/2020  6:20 AM      Failed - Cr in normal range and within 180 days    Creatinine, Ser  Date Value Ref Range Status  04/04/2020 1.03 0.76 - 1.27 mg/dL Final         Failed - K in normal range and within 180 days    Potassium  Date Value Ref Range Status  04/04/2020 5.2 3.5 - 5.2 mmol/L Final         Passed - Patient is not pregnant      Passed - Last BP in normal range    BP Readings from Last 1 Encounters:  09/12/20 126/80         Passed - Valid encounter within last 6 months  Recent Outpatient Visits          2 months ago SVT (supraventricular tachycardia) [facility])   [facility]izer, MD   5 months ago Benign hypertension   [facility]izer, MD   6 months ago Benign hypertension   [facility] [place], Drue Stager, MD   7 months ago SVT (supraventricular tachycardia) [facility])   [facility] Steele Sizer, MD   10 months ago Benign hypertension   [facility] Sizer, MD      Future Appointments            In 3 weeks Steele Sizer, MD [facility], [place]   In 2 months Kate Sable, MD [facility], [facility]

## 2020-11-14 NOTE — Telephone Encounter (Signed)
Next appt is 12/06/2020

## 2020-12-06 ENCOUNTER — Ambulatory Visit: Payer: 59 | Admitting: Family Medicine

## 2020-12-12 ENCOUNTER — Other Ambulatory Visit: Payer: Self-pay | Admitting: Family Medicine

## 2020-12-12 DIAGNOSIS — I471 Supraventricular tachycardia: Secondary | ICD-10-CM

## 2020-12-12 DIAGNOSIS — I10 Essential (primary) hypertension: Secondary | ICD-10-CM

## 2020-12-23 ENCOUNTER — Other Ambulatory Visit: Payer: Self-pay | Admitting: Family Medicine

## 2020-12-23 DIAGNOSIS — F901 Attention-deficit hyperactivity disorder, predominantly hyperactive type: Secondary | ICD-10-CM

## 2020-12-23 DIAGNOSIS — I10 Essential (primary) hypertension: Secondary | ICD-10-CM

## 2020-12-23 NOTE — Telephone Encounter (Signed)
  Requested medication (s) are on the active medication list: yes  Future visit scheduled: yes  Notes to clinic:  Patient has appt on 01/03/2021 Review for 90 day refill Refill cannot be delegated    Requested Prescriptions  Pending Prescriptions Disp Refills   olmesartan (BENICAR) 20 MG tablet 90 tablet 0    Sig: Take 1 tablet (20 mg total) by mouth daily. In place of losartan     Cardiovascular:  Angiotensin Receptor Blockers Failed - 12/23/2020 10:02 AM      Failed - Cr in normal range and within 180 days    Creatinine, Ser  Date Value Ref Range Status  04/04/2020 1.03 0.76 - 1.27 mg/dL Final          Failed - K in normal range and within 180 days    Potassium  Date Value Ref Range Status  04/04/2020 5.2 3.5 - 5.2 mmol/L Final          Passed - Patient is not pregnant      Passed - Last BP in normal range    BP Readings from Last 1 Encounters:  09/12/20 126/80          Passed - Valid encounter within last 6 months    Recent Outpatient Visits           3 months ago SVT (supraventricular tachycardia) [facility])   [facility]izer, MD   6 months ago Benign hypertension   [facility]izer, MD   8 months ago Benign hypertension   [facility] [place], Drue Stager, MD   8 months ago SVT (supraventricular tachycardia) [facility])   [facility], MD   11 months ago Benign hypertension   [facility]izer, MD       Future Appointments             In 1 week Steele Sizer, MD [facility], [place]   In 1 month Agbor-Etang, Aaron Edelman, MD [facility], [facility]             atomoxetine (STRATTERA) 40 MG capsule 90 capsule 0    Sig: Take 1 capsule (40 mg total) by mouth daily.     Not Delegated - Psychiatry: Norepinephrine Reuptake Inhibitor Failed - 12/23/2020 10:02 AM      Failed - This refill cannot  be delegated      Passed - Valid encounter within last 6 months    Recent Outpatient Visits           3 months ago SVT (supraventricular tachycardia) [facility])   [facility]izer, MD   6 months ago Benign hypertension   [facility]izer, MD   8 months ago Benign hypertension   [facility] [place], Drue Stager, MD   8 months ago SVT (supraventricular tachycardia) [facility])   [facility] Sizer, MD   11 months ago Benign hypertension   [facility]izer, MD       Future Appointments             In 1 week Steele Sizer, MD [facility], [place]   In 1 month Kate Sable, MD [facility], [facility]

## 2020-12-23 NOTE — Telephone Encounter (Signed)
Last seen on 09-12-2020

## 2020-12-23 NOTE — Telephone Encounter (Signed)
Copied from [place] [id number]. Topic: Quick Communication - Rx Refill/Question >> Dec 23, 2020  9:49 AM Yvette Rack wrote: Medication: olmesartan (BENICAR) 20 MG tablet and atomoxetine (STRATTERA) 40 MG capsule  Has the patient contacted their pharmacy? Yes.   (Agent: If no, request that the patient contact the pharmacy for the refill.) (Agent: If yes, when and what did the pharmacy advise?)  Preferred Pharmacy (with phone number or street name): [facility] - [place], [place]  Phone: [phone]  Fax: [phone]  Agent: Please be advised that RX refills may take up to 3 business days. We ask that you follow-up with your pharmacy.

## 2020-12-24 LAB — COMPREHENSIVE METABOLIC PANEL
ALT: 258 IU/L — ABNORMAL HIGH (ref 0–44)
AST: 177 IU/L — ABNORMAL HIGH (ref 0–40)
Albumin/Globulin Ratio: 1.4 (ref 1.2–2.2)
Albumin: 4 g/dL (ref 3.8–4.9)
Alkaline Phosphatase: 100 IU/L (ref 44–121)
BUN/Creatinine Ratio: 15 (ref 9–20)
BUN: 27 mg/dL — ABNORMAL HIGH (ref 6–24)
Bilirubin Total: 0.6 mg/dL (ref 0.0–1.2)
CO2: 27 mmol/L (ref 20–29)
Calcium: 10.9 mg/dL — ABNORMAL HIGH (ref 8.7–10.2)
Chloride: 100 mmol/L (ref 96–106)
Creatinine, Ser: 1.84 mg/dL — ABNORMAL HIGH (ref 0.76–1.27)
Globulin, Total: 2.8 g/dL (ref 1.5–4.5)
Glucose: 97 mg/dL (ref 65–99)
Potassium: 3.9 mmol/L (ref 3.5–5.2)
Sodium: 141 mmol/L (ref 134–144)
Total Protein: 6.8 g/dL (ref 6.0–8.5)
eGFR: 43 mL/min/{1.73_m2} — ABNORMAL LOW (ref >59)

## 2020-12-26 LAB — HEPATITIS B CORE ANTIBODY, TOTAL: Hep B Core Total Ab: NEGATIVE

## 2020-12-26 LAB — HEPATITIS A ANTIBODY, TOTAL: hep A Total Ab: NEGATIVE

## 2020-12-26 LAB — HEPATITIS B SURFACE ANTIBODY,QUALITATIVE: Hep B Surface Ab, Qual: NONREACTIVE

## 2020-12-26 LAB — ALPHA-1-ANTITRYPSIN: A-1 Antitrypsin: 145 mg/dL (ref 101–187)

## 2020-12-26 LAB — PROTIME-INR
INR: 1 (ref 0.9–1.2)
Prothrombin Time: 10.5 s (ref 9.1–12.0)

## 2020-12-26 LAB — IRON,TIBC AND FERRITIN PANEL
Ferritin: 490 ng/mL — ABNORMAL HIGH (ref 30–400)
Iron Saturation: 23 % (ref 15–55)
Iron: 71 ug/dL (ref 38–169)
Total Iron Binding Capacity: 305 ug/dL (ref 250–450)
UIBC: 234 ug/dL (ref 111–343)

## 2020-12-26 LAB — CERULOPLASMIN: Ceruloplasmin: 27.6 mg/dL (ref 16.0–31.0)

## 2020-12-26 LAB — ANTI-MICROSOMAL ANTIBODY LIVER / KIDNEY: LKM1 Ab: 0.9 Units (ref 0.0–20.0)

## 2020-12-26 LAB — IGA: IgA/Immunoglobulin A, Serum: 322 mg/dL (ref 90–386)

## 2020-12-26 LAB — HEPATITIS B SURFACE ANTIGEN: Hepatitis B Surface Ag: NEGATIVE

## 2020-12-26 LAB — ANA: Anti Nuclear Antibody (ANA): NEGATIVE

## 2020-12-26 LAB — TISSUE TRANSGLUTAMINASE, IGA: Transglutaminase IgA: <2 U/mL (ref 0–3)

## 2020-12-26 LAB — ANTI-SMOOTH MUSCLE ANTIBODY, IGG: Smooth Muscle Ab: 5 Units (ref 0–19)

## 2020-12-26 MED ORDER — OLMESARTAN MEDOXOMIL 20 MG PO TABS: 20.0000 mg | ORAL_TABLET | Freq: Every day | ORAL | 0 refills | Status: DC

## 2020-12-26 MED ORDER — ATOMOXETINE HCL 40 MG PO CAPS: 40.0000 mg | ORAL_CAPSULE | Freq: Every day | ORAL | 0 refills | Status: DC

## 2021-01-02 NOTE — Progress Notes (Signed)
Name: Victor Valdez   MRN: [id number]    DOB: 1966-01-20   Date:01/03/2021       Progress Note  Subjective  Chief Complaint  Follow Up  HPI  ADD: we stopped Adderal because of episodes of narrow complex tachycardia and syncope that happened Nov 2021. He is now on Strattera , initially felt emotional but now feels fine. No palpitation, he is now feeling jittery again, anxious. He also has noticed that he falls asleep at 9 pm but wakes up around 2 am and has difficulty falling back asleep. We changed strattera to 40 mg to take in am's only and he just started a few days ago, he will let me know if it is working and ask for a refill soon  Elevated liver enzymes/Alcoholism in remission: no alcohol since hospital stay Nov 2021 There is a family history of alcoholism - sister and father . He has been exercising instead of drinking , seen GI and recently had labs done as requested. . Denies nausea, vomiting, change in bowel movements , stools are normal in color . He states he is really worried that he has liver cirrhosis and that is the reason it took him so long to have labs done. He is going to [place] meetings   Snoring: he also has daily fatigue, recent episode of syncope. ESS of 10, we scheduled a sleep study but it was denied, he needs to have a home study , he states he has contacted them but it was not scheduled yet , I will send another referral today   HTN:he is on beta-blocker and Benicar, his bp has been at goal, no side effects of medications    Insomnia: he was taking Melatonin and Temazepam but still wakes up during the night. We will try switching to Quviviq and monitor  History of syncope and SVT: seeing cardiologist, on Atenolol, no problems since   Anxiety/dysthymia : he was feeling  jittery and anxious during his last visit and asked to start medication, he is no Lexapro, but still states his mind is busy and worries about his health. Discussed therapy and we will go up on Lexapro  today   Mild dilation of ascending aorta: found on Echo done in 2021 at [facility]   Patient Active Problem List   Diagnosis Date Noted   Mild dilation of ascending aorta ([place]) 04/04/2020   Hepatic steatosis 04/04/2020   Low serum parathyroid hormone (PTH) 04/04/2020   Hypomagnesemia 04/04/2020   Hypercalcemia 04/04/2020   Narrow complex tachycardia ([place]) 03/30/2020   Syncope 03/30/2020   Internal hemorrhoids 10/23/2016   Diverticulosis 10/23/2016   ADD (attention deficit disorder) 11/13/2014   Basal cell carcinoma of face 11/13/2014   CD (contact dermatitis) 11/13/2014   Dyslipidemia 11/13/2014   Blood glucose elevated 11/13/2014   Benign hypertension 11/13/2014   Perennial allergic rhinitis with seasonal variation 11/13/2014   Allergic rhinitis 11/13/2014    Past Surgical History:  Procedure Laterality Date   COLONOSCOPY WITH PROPOFOL N/A 10/08/2016   Procedure: COLONOSCOPY WITH PROPOFOL;  Surgeon: Lollie Sails, MD;  Location: [facility] ENDOSCOPY;  Service: Endoscopy;  Laterality: N/A;   EYE SURGERY Bilateral    lasik   LASIK Bilateral 12.30.2015    Family History  Problem Relation Age of Onset   Hypertension Mother    Cancer Father 33       prostate cancer   Hypertension Sister    Obesity Sister     Social History   Tobacco Use  Smoking status: Never   Smokeless tobacco: Former  Substance Use Topics   Alcohol use: Yes    Alcohol/week: 0.0 standard drinks     Current Outpatient Medications:    atomoxetine (STRATTERA) 40 MG capsule, Take 1 capsule (40 mg total) by mouth daily., Disp: 90 capsule, Rfl: 0   Daridorexant HCl (QUVIVIQ) 50 MG TABS, Take 1 tablet by mouth every evening., Disp: 30 tablet, Rfl: 2   Melatonin 5 MG CHEW, Chew 1 tablet by mouth daily., Disp: 30 tablet, Rfl: 0   temazepam (RESTORIL) 15 MG capsule, Take 1 capsule (15 mg total) by mouth at bedtime as needed for sleep., Disp: 90 capsule, Rfl: 0   atenolol (TENORMIN) 25 MG tablet, Take 1 tablet  (25 mg total) by mouth every evening., Disp: 90 tablet, Rfl: 1   clindamycin (CLEOCIN-T) 1 % lotion, Apply topically as directed. qd-bid to chest and chin (Patient not taking: Reported on 01/03/2021), Disp: 60 mL, Rfl: 3   escitalopram (LEXAPRO) 10 MG tablet, Take 1 tablet (10 mg total) by mouth daily., Disp: 90 tablet, Rfl: 1   fluticasone (FLONASE) 50 MCG/ACT nasal spray, 1 spray into each nostril daily. (Patient not taking: Reported on 01/03/2021), Disp: , Rfl:    olmesartan (BENICAR) 20 MG tablet, Take 1 tablet (20 mg total) by mouth daily., Disp: 90 tablet, Rfl: 1  Allergies  Allergen Reactions   Aspirin Anaphylaxis   Nsaids    Penicillins     I personally reviewed active problem list, medication list, allergies, family history, social history, health maintenance with the patient/caregiver today.   ROS  Constitutional: Negative for fever or weight change.  Respiratory: Negative for cough and shortness of breath.   Cardiovascular: Negative for chest pain or palpitations.  Gastrointestinal: Negative for abdominal pain, no bowel changes.  Musculoskeletal: Negative for gait problem or joint swelling.  Skin: Negative for rash.  Neurological: Negative for dizziness or headache.  No other specific complaints in a complete review of systems (except as listed in HPI above).   Objective  Vitals:   01/03/21 1417  BP: 120/82  Pulse: (!) 107  Resp: 16  Temp: 98.4 F (36.9 C)  SpO2: 98%  Weight: 207 lb (93.9 kg)  Height: $Remove'5\' 10"'iJVmJGb$  (1.778 m)    Body mass index is 29.7 kg/m.  Physical Exam  Constitutional: Patient appears well-developed and well-nourished. Overweight.  No distress.  HEENT: head atraumatic, normocephalic, pupils equal and reactive to light,neck supple Cardiovascular: Normal rate, regular rhythm and normal heart sounds.  No murmur heard. No BLE edema. Pulmonary/Chest: Effort normal and breath sounds normal. No respiratory distress. Abdominal: Soft.  There is no  tenderness. Psychiatric: Patient has a normal mood and affect. behavior is normal. Judgment and thought content normal.   Recent Results (from the past 2160 hour(s))  Comprehensive metabolic panel     Status: Abnormal   Collection Time: 12/23/20  8:52 AM  Result Value Ref Range   Glucose 97 65 - 99 mg/dL   BUN 27 (H) 6 - 24 mg/dL   Creatinine, Ser 1.84 (H) 0.76 - 1.27 mg/dL   eGFR 43 (L) >59 mL/min/1.73   BUN/Creatinine Ratio 15 9 - 20   Sodium 141 134 - 144 mmol/L   Potassium 3.9 3.5 - 5.2 mmol/L   Chloride 100 96 - 106 mmol/L   CO2 27 20 - 29 mmol/L   Calcium 10.9 (H) 8.7 - 10.2 mg/dL   Total Protein 6.8 6.0 - 8.5 g/dL   Albumin 4.0  3.8 - 4.9 g/dL   Globulin, Total 2.8 1.5 - 4.5 g/dL   Albumin/Globulin Ratio 1.4 1.2 - 2.2   Bilirubin Total 0.6 0.0 - 1.2 mg/dL   Alkaline Phosphatase 100 44 - 121 IU/L   AST 177 (H) 0 - 40 IU/L   ALT 258 (H) 0 - 44 IU/L  Hepatitis B core antibody, total     Status: None   Collection Time: 12/23/20  8:53 AM  Result Value Ref Range   Hep B Core Total Ab Negative Negative  Iron, TIBC and Ferritin Panel     Status: Abnormal   Collection Time: 12/23/20  8:53 AM  Result Value Ref Range   Total Iron Binding Capacity 305 250 - 450 ug/dL   UIBC 234 111 - 343 ug/dL   Iron 71 38 - 169 ug/dL   Iron Saturation 23 15 - 55 %   Ferritin 490 (H) 30 - 400 ng/mL  Protime-INR     Status: None   Collection Time: 12/23/20  8:53 AM  Result Value Ref Range   INR 1.0 0.9 - 1.2    Comment: Reference interval is for non-anticoagulated patients. Suggested INR therapeutic range for Vitamin K antagonist therapy:    Standard Dose (moderate intensity                   therapeutic range):       2.0 - 3.0    Higher intensity therapeutic range       2.5 - 3.5    Prothrombin Time 10.5 9.1 - 12.0 sec  Hepatitis B surface antibody,qualitative     Status: None   Collection Time: 12/23/20  8:53 AM  Result Value Ref Range   Hep B Surface Ab, Qual Non Reactive     Comment:                Non Reactive: Inconsistent with immunity,                             less than 10 mIU/mL               Reactive:     Consistent with immunity,                             greater than 9.9 mIU/mL   Anti-smooth muscle antibody, IgG     Status: None   Collection Time: 12/23/20  8:53 AM  Result Value Ref Range   Smooth Muscle Ab 5 0 - 19 Units    Comment:                  Negative                     0 - 19                  Weak positive               20 - 30                  Moderate to strong positive     >30  Actin Antibodies are found in 52-85% of patients with  autoimmune hepatitis or chronic active hepatitis and  in 22% of patients with primary biliary cirrhosis.   AntiMicrosomal Ab-Liver / Kidney     Status: None   Collection Time: 12/23/20  8:53 AM  Result Value Ref Range   LKM1 Ab 0.9 0.0 - 20.0 Units    Comment:                                 Negative    0.0 - 20.0                                 Equivocal  20.1 - 24.9                                 Positive         >24.9 LKM type 1 antibodies are detected in patients with autoimmune hepatitis type 2 and in up to 8% of patients with chronic HCV infection.   Tissue transglutaminase, IgA     Status: None   Collection Time: 12/23/20  8:53 AM  Result Value Ref Range   Transglutaminase IgA <2 0 - 3 U/mL    Comment:                               Negative        0 -  3                               Weak Positive   4 - 10                               Positive           >10  Tissue Transglutaminase (tTG) has been identified  as the endomysial antigen.  Studies have demonstr-  ated that endomysial IgA antibodies have over 99%  specificity for gluten sensitive enteropathy.   ANA     Status: None   Collection Time: 12/23/20  8:53 AM  Result Value Ref Range   Anti Nuclear Antibody (ANA) Negative Negative  Ceruloplasmin     Status: None   Collection Time: 12/23/20  8:53 AM  Result Value Ref Range    Ceruloplasmin 27.6 16.0 - 31.0 mg/dL  IgA     Status: None   Collection Time: 12/23/20  8:53 AM  Result Value Ref Range   IgA/Immunoglobulin A, Serum 322 90 - 386 mg/dL  Alpha-1-antitrypsin     Status: None   Collection Time: 12/23/20  8:53 AM  Result Value Ref Range   A-1 Antitrypsin 145 101 - 187 mg/dL  Hepatitis B surface antigen     Status: None   Collection Time: 12/23/20  8:53 AM  Result Value Ref Range   Hepatitis B Surface Ag Negative Negative  Hepatitis A antibody, total     Status: None   Collection Time: 12/23/20  8:53 AM  Result Value Ref Range   hep A Total Ab Negative Negative     PHQ2/9: Depression screen [facility] 2/9 01/03/2021 09/12/2020 06/13/2020 04/21/2020 04/04/2020  Decreased Interest 2 1 0 2 1  Down, Depressed, Hopeless 2 0 0 1 2  PHQ - 2 Score 4 1 0 3 3  Altered sleeping _0 Tired, decreased energy 2 0 0 0 1  Change in appetite 0 0  0 0 2  Feeling bad or failure about yourself  0 0 0 0 3  Trouble concentrating 2 0 _0 Moving slowly or fidgety/restless 0 0 0 0 0  Suicidal thoughts 0 0 0 0 0  PHQ-9 Score _1 Difficult doing work/chores - - Not difficult at all Somewhat difficult -  Some recent data might be hidden    phq 9 is positive   Fall Risk: Fall Risk  01/03/2021 09/12/2020 06/13/2020 04/21/2020 04/04/2020  Falls in the past year? _2 Number falls in past yr: 0 0 0 0 0  Injury with Fall? 0 0 _3 Risk for fall due to : No Fall Risks - - History of fall(s) -  Follow up Falls prevention discussed - - Falls prevention discussed -     Functional Status Survey: Is the patient deaf or have difficulty hearing?: No Does the patient have difficulty seeing, even when wearing glasses/contacts?: No Does the patient have difficulty concentrating, remembering, or making decisions?: No Does the patient have difficulty walking or climbing stairs?: No Does the patient have difficulty dressing or bathing?: No Does the patient have  difficulty doing errands alone such as visiting a doctor's office or shopping?: No    Assessment & Plan  1. SVT (supraventricular tachycardia) (HCC)  - atenolol (TENORMIN) 25 MG tablet; Take 1 tablet (25 mg total) by mouth every evening.  Dispense: 90 tablet; Refill: 1  2. Need for immunization against influenza  - Flu Vaccine QUAD 43moIM (Fluarix, Fluzone & Alfiuria Quad PF)  3. Need for hepatitis B screening test  - Hepatitis B vaccine adult IM  4. Benign hypertension  - atenolol (TENORMIN) 25 MG tablet; Take 1 tablet (25 mg total) by mouth every evening.  Dispense: 90 tablet; Refill: 1 - olmesartan (BENICAR) 20 MG tablet; Take 1 tablet (20 mg total) by mouth daily.  Dispense: 90 tablet; Refill: 1  5. Anxiety  - escitalopram (LEXAPRO) 10 MG tablet; Take 1 tablet (10 mg total) by mouth daily.  Dispense: 90 tablet; Refill: 1  6. Attention deficit hyperactivity disorder (ADHD), predominantly hyperactive type   7. Vitamin D deficiency   8. Dyslipidemia   9. Mild dilation of ascending aorta (HCC)  Found on Echo, we will monitor   10. Insomnia, unspecified type  We will try QuviviQ  11. Alcoholism in remission ([place]  Going to [employer]

## 2021-01-03 ENCOUNTER — Ambulatory Visit: Payer: 59 | Admitting: Family Medicine

## 2021-01-03 ENCOUNTER — Other Ambulatory Visit: Payer: Self-pay

## 2021-01-03 ENCOUNTER — Encounter: Payer: Self-pay | Admitting: Family Medicine

## 2021-01-03 VITALS — BP 120/82 | HR 107 | Temp 98.4°F | Resp 16 | Ht 70.0 in | Wt 207.0 lb

## 2021-01-03 DIAGNOSIS — F419 Anxiety disorder, unspecified: Secondary | ICD-10-CM

## 2021-01-03 DIAGNOSIS — Z23 Encounter for immunization: Secondary | ICD-10-CM | POA: Diagnosis not present

## 2021-01-03 DIAGNOSIS — F1021 Alcohol dependence, in remission: Secondary | ICD-10-CM

## 2021-01-03 DIAGNOSIS — I77810 Thoracic aortic ectasia: Secondary | ICD-10-CM

## 2021-01-03 DIAGNOSIS — E559 Vitamin D deficiency, unspecified: Secondary | ICD-10-CM

## 2021-01-03 DIAGNOSIS — Z1159 Encounter for screening for other viral diseases: Secondary | ICD-10-CM | POA: Diagnosis not present

## 2021-01-03 DIAGNOSIS — G4700 Insomnia, unspecified: Secondary | ICD-10-CM

## 2021-01-03 DIAGNOSIS — I10 Essential (primary) hypertension: Secondary | ICD-10-CM | POA: Diagnosis not present

## 2021-01-03 DIAGNOSIS — I471 Supraventricular tachycardia: Secondary | ICD-10-CM

## 2021-01-03 DIAGNOSIS — E785 Hyperlipidemia, unspecified: Secondary | ICD-10-CM

## 2021-01-03 DIAGNOSIS — F901 Attention-deficit hyperactivity disorder, predominantly hyperactive type: Secondary | ICD-10-CM

## 2021-01-03 MED ORDER — QUVIVIQ 50 MG PO TABS: 1.0000 | ORAL_TABLET | Freq: Every evening | ORAL | 2 refills | Status: DC

## 2021-01-03 MED ORDER — ATENOLOL 25 MG PO TABS: 25.0000 mg | ORAL_TABLET | Freq: Every evening | ORAL | 1 refills | Status: DC

## 2021-01-03 MED ORDER — ESCITALOPRAM OXALATE 10 MG PO TABS
10.0000 mg | ORAL_TABLET | Freq: Every day | ORAL | 1 refills | Status: DC
Start: 2021-01-03 — End: 2021-04-05

## 2021-01-03 MED ORDER — OLMESARTAN MEDOXOMIL 20 MG PO TABS: 20.0000 mg | ORAL_TABLET | Freq: Every day | ORAL | 1 refills | Status: DC

## 2021-01-30 ENCOUNTER — Ambulatory Visit: Payer: 59 | Admitting: Cardiology

## 2021-01-31 ENCOUNTER — Encounter: Payer: Self-pay | Admitting: Cardiology

## 2021-02-26 ENCOUNTER — Other Ambulatory Visit: Payer: Self-pay | Admitting: Family Medicine

## 2021-02-26 DIAGNOSIS — F901 Attention-deficit hyperactivity disorder, predominantly hyperactive type: Secondary | ICD-10-CM

## 2021-02-26 NOTE — Telephone Encounter (Signed)
Requested medication (s) are due for refill today: no  Requested medication (s) are on the active medication list: yes  Last refill:  12/26/20  #90 Future visit scheduled: yes  Notes to clinic:  med not delegated to NT to RF   Requested Prescriptions  Pending Prescriptions Disp Refills   atomoxetine (STRATTERA) 40 MG capsule [Pharmacy Med Name: ATOMOXETINE  40MG   CAP] 90 capsule 3    Sig: TAKE 1 CAPSULE BY MOUTH  DAILY     Not Delegated - Psychiatry: Norepinephrine Reuptake Inhibitor Failed - 02/26/2021  5:37 AM      Failed - This refill cannot be delegated      Passed - Valid encounter within last 6 months    Recent Outpatient Visits           1 month ago SVT (supraventricular tachycardia) [facility])   [facility] Steele Sizer, MD   5 months ago SVT (supraventricular tachycardia) [facility])   [facility] Steele Sizer, MD   8 months ago Benign hypertension   [facility] Steele Sizer, MD   10 months ago Benign hypertension   [facility] [place], Drue Stager, MD   10 months ago SVT (supraventricular tachycardia) [facility])   [facility] Steele Sizer, MD       Future Appointments             In 1 month Ancil Boozer, Drue Stager, MD [facility], [facility]

## 2021-02-27 NOTE — Telephone Encounter (Signed)
Lvm to sch appt per dr. If sowles not available then one of the other providers can assist with an appt.

## 2021-04-04 NOTE — Progress Notes (Signed)
Name: Victor Valdez   MRN: [id number]    DOB: December 04, 1965   Date:04/05/2021       Progress Note  Subjective  Chief Complaint  Follow Up  HPI  ADD: we stopped Adderal because of episodes of narrow complex tachycardia and syncope that happened Nov 2021. He is now on Strattera , initially felt emotional but now feels fine. It is helping with focus and denies side effects of medications at this time. She needs refills.  Elevated liver enzymes/Alcoholism in remission: he stopped drinking during [facility] hospital stay back in Nov 2021, he had a short relapse in August but has been off again since that time. He is attending [place] meetings and has been helpful.  There is a family history of alcoholism - sister and father . He finally had labs ordered by Dr. Marius Ditch but still afraid to go back to see her. Reviewed labs and ferritin was elevated other labs normal. Discussed hemochromatosis but he states he knows the reason is the alcoholism. Reviewed last liver enzymes - done in August and explained importance of follow up and repeat labs.   Snoring:sleep study still not done   HTN:he is on beta-blocker and Benicar, his bp has been at goal. He denies chest pain, had one episode of palpitation since last visit, woke him up during the night and resolved by itself. He has a history of SVT . Symptoms resolved with valsalva maneuver  Insomnia: he was taking Melatonin and Temazepam but still wakes up during the night. He has been taking [place] and is doing well, able to fall and stay asleep.   History of syncope and SVT: seeing cardiologist, on Atenolol,only one episode of palpitation since last visit   Anxiety/dysthymia : he was feeling  jittery and anxious during the Summer and asked to start medication, he is now Lexapro and doing better. No side effects of medications   Mild dilation of ascending aorta: found on Echo done in 2021 at [facility] - reached out to Dr. Mylo Red to see if we will need to repeat Echo. Waiting  for reply   Patient Active Problem List   Diagnosis Date Noted   Mild dilation of ascending aorta ([place]) 04/04/2020   Hepatic steatosis 04/04/2020   Low serum parathyroid hormone (PTH) 04/04/2020   Hypomagnesemia 04/04/2020   Hypercalcemia 04/04/2020   Narrow complex tachycardia ([place]) 03/30/2020   Syncope 03/30/2020   Internal hemorrhoids 10/23/2016   Diverticulosis 10/23/2016   ADD (attention deficit disorder) 11/13/2014   Basal cell carcinoma of face 11/13/2014   CD (contact dermatitis) 11/13/2014   Dyslipidemia 11/13/2014   Blood glucose elevated 11/13/2014   Benign hypertension 11/13/2014   Perennial allergic rhinitis with seasonal variation 11/13/2014   Allergic rhinitis 11/13/2014    Past Surgical History:  Procedure Laterality Date   COLONOSCOPY WITH PROPOFOL N/A 10/08/2016   Procedure: COLONOSCOPY WITH PROPOFOL;  Surgeon: Lollie Sails, MD;  Location: [facility] ENDOSCOPY;  Service: Endoscopy;  Laterality: N/A;   EYE SURGERY Bilateral    lasik   LASIK Bilateral 12.30.2015    Family History  Problem Relation Age of Onset   Hypertension Mother    Cancer Father 44       prostate cancer   Hypertension Sister    Obesity Sister     Social History   Tobacco Use   Smoking status: Never   Smokeless tobacco: Former  Substance Use Topics   Alcohol use: Yes    Alcohol/week: 0.0 standard drinks  Current Outpatient Medications:    atenolol (TENORMIN) 25 MG tablet, Take 1 tablet (25 mg total) by mouth every evening., Disp: 90 tablet, Rfl: 1   atomoxetine (STRATTERA) 40 MG capsule, Take 1 capsule (40 mg total) by mouth daily., Disp: 90 capsule, Rfl: 0   Daridorexant HCl (QUVIVIQ) 50 MG TABS, Take 1 tablet by mouth every evening., Disp: 30 tablet, Rfl: 2   escitalopram (LEXAPRO) 10 MG tablet, Take 1 tablet (10 mg total) by mouth daily., Disp: 90 tablet, Rfl: 1   Melatonin 5 MG CHEW, Chew 1 tablet by mouth daily., Disp: 30 tablet, Rfl: 0   olmesartan (BENICAR) 20 MG  tablet, Take 1 tablet (20 mg total) by mouth daily., Disp: 90 tablet, Rfl: 1   fluticasone (FLONASE) 50 MCG/ACT nasal spray, 1 spray into each nostril daily. (Patient not taking: Reported on 01/03/2021), Disp: , Rfl:   Allergies  Allergen Reactions   Aspirin Anaphylaxis   Nsaids    Penicillins     I personally reviewed active problem list, medication list, allergies, family history, social history, health maintenance with the patient/caregiver today.   ROS  Constitutional: Negative for fever or weight change.  Respiratory: Negative for cough and shortness of breath.   Cardiovascular: Negative for chest pain or palpitations.  Gastrointestinal: Negative for abdominal pain, no bowel changes.  Musculoskeletal: Negative for gait problem or joint swelling.  Skin: Negative for rash.  Neurological: Negative for dizziness or headache.  No other specific complaints in a complete review of systems (except as listed in HPI above).   Objective  Vitals:   04/05/21 1101  BP: 122/84  Pulse: 81  Resp: 16  Temp: 98.1 F (36.7 C)  SpO2: 97%  Weight: 214 lb (97.1 kg)  Height: 5\' 10"  (1.778 m)    Body mass index is 30.71 kg/m.  Physical Exam  Constitutional: Patient appears well-developed and well-nourished. Obese  No distress.  HEENT: head atraumatic, normocephalic, pupils equal and reactive to light,  neck supple Cardiovascular: Normal rate, regular rhythm and normal heart sounds.  No murmur heard. No BLE edema. Pulmonary/Chest: Effort normal and breath sounds normal. No respiratory distress. Abdominal: Soft.  There is no tenderness. Psychiatric: Patient has a normal mood and affect. behavior is normal. Judgment and thought content normal.    PHQ2/9: Depression screen [facility] 2/9 04/05/2021 01/03/2021 09/12/2020 06/13/2020 04/21/2020  Decreased Interest 0 2 1 0 2  Down, Depressed, Hopeless 0 2 0 0 1  PHQ - 2 Score 0 4 1 0 3  Altered sleeping 0 2 3 1 1   Tired, decreased energy 0 2 0 0 0   Change in appetite 0 0 0 0 0  Feeling bad or failure about yourself  0 0 0 0 0  Trouble concentrating 0 2 0 1 1  Moving slowly or fidgety/restless 0 0 0 0 0  Suicidal thoughts 0 0 0 0 0  PHQ-9 Score 0 10 4 2 5   Difficult doing work/chores - - - Not difficult at all Somewhat difficult  Some recent data might be hidden    phq 9 is negative   Fall Risk: Fall Risk  04/05/2021 01/03/2021 09/12/2020 06/13/2020 04/21/2020  Falls in the past year? 0 1 1 1 1   Number falls in past yr: 0 0 0 0 0  Injury with Fall? 0 0 0 1 1  Risk for fall due to : No Fall Risks No Fall Risks - - History of fall(s)  Follow up Falls prevention discussed Falls  prevention discussed - - Falls prevention discussed      Functional Status Survey: Is the patient deaf or have difficulty hearing?: No Does the patient have difficulty seeing, even when wearing glasses/contacts?: No Does the patient have difficulty concentrating, remembering, or making decisions?: No Does the patient have difficulty walking or climbing stairs?: No Does the patient have difficulty dressing or bathing?: No Does the patient have difficulty doing errands alone such as visiting a doctor's office or shopping?: No    Assessment & Plan  1. SVT (supraventricular tachycardia) (HCC)  - atenolol (TENORMIN) 25 MG tablet; Take 1 tablet (25 mg total) by mouth every evening.  Dispense: 90 tablet; Refill: 1  2. Elevated LFTs  - Comprehensive metabolic panel  3. Alcoholism in remission ([place])   4. Attention deficit hyperactivity disorder (ADHD), predominantly hyperactive type  - atomoxetine (STRATTERA) 40 MG capsule; Take 1 capsule (40 mg total) by mouth daily.  Dispense: 90 capsule; Refill: 0  5. Dyslipidemia  - Lipid Profile  6. Elevated ferritin  - Ferritin  7. Hypercalcemia  - Comprehensive metabolic panel  8. Decreased calculated GFR  - Comprehensive metabolic panel  9. Benign hypertension  - olmesartan (BENICAR) 20 MG  tablet; Take 1 tablet (20 mg total) by mouth daily.  Dispense: 90 tablet; Refill: 1 - atenolol (TENORMIN) 25 MG tablet; Take 1 tablet (25 mg total) by mouth every evening.  Dispense: 90 tablet; Refill: 1  10. Anxiety  - escitalopram (LEXAPRO) 10 MG tablet; Take 1 tablet (10 mg total) by mouth daily.  Dispense: 90 tablet; Refill: 1

## 2021-04-05 ENCOUNTER — Encounter: Payer: Self-pay | Admitting: Family Medicine

## 2021-04-05 ENCOUNTER — Ambulatory Visit: Payer: 59 | Admitting: Family Medicine

## 2021-04-05 ENCOUNTER — Other Ambulatory Visit: Payer: Self-pay

## 2021-04-05 ENCOUNTER — Telehealth: Payer: Self-pay

## 2021-04-05 VITALS — BP 122/84 | HR 81 | Temp 98.1°F | Resp 16 | Ht 70.0 in | Wt 214.0 lb

## 2021-04-05 DIAGNOSIS — R944 Abnormal results of kidney function studies: Secondary | ICD-10-CM

## 2021-04-05 DIAGNOSIS — I77810 Thoracic aortic ectasia: Secondary | ICD-10-CM

## 2021-04-05 DIAGNOSIS — F901 Attention-deficit hyperactivity disorder, predominantly hyperactive type: Secondary | ICD-10-CM

## 2021-04-05 DIAGNOSIS — F1021 Alcohol dependence, in remission: Secondary | ICD-10-CM | POA: Diagnosis not present

## 2021-04-05 DIAGNOSIS — I10 Essential (primary) hypertension: Secondary | ICD-10-CM

## 2021-04-05 DIAGNOSIS — E785 Hyperlipidemia, unspecified: Secondary | ICD-10-CM

## 2021-04-05 DIAGNOSIS — I471 Supraventricular tachycardia, unspecified: Secondary | ICD-10-CM

## 2021-04-05 DIAGNOSIS — R7989 Other specified abnormal findings of blood chemistry: Secondary | ICD-10-CM | POA: Diagnosis not present

## 2021-04-05 DIAGNOSIS — F419 Anxiety disorder, unspecified: Secondary | ICD-10-CM

## 2021-04-05 MED ORDER — QUVIVIQ 50 MG PO TABS: 1.0000 | ORAL_TABLET | Freq: Every evening | ORAL | 1 refills | Status: DC

## 2021-04-05 MED ORDER — ATENOLOL 25 MG PO TABS: 25.0000 mg | ORAL_TABLET | Freq: Every evening | ORAL | 1 refills | Status: DC

## 2021-04-05 MED ORDER — ESCITALOPRAM OXALATE 10 MG PO TABS: 10.0000 mg | ORAL_TABLET | Freq: Every day | ORAL | 1 refills | Status: DC

## 2021-04-05 MED ORDER — OLMESARTAN MEDOXOMIL 20 MG PO TABS: 20.0000 mg | ORAL_TABLET | Freq: Every day | ORAL | 1 refills | Status: DC

## 2021-04-05 MED ORDER — ATOMOXETINE HCL 40 MG PO CAPS: 40.0000 mg | ORAL_CAPSULE | Freq: Every day | ORAL | 0 refills | Status: DC

## 2021-04-05 NOTE — Telephone Encounter (Signed)
Received a secure chat from Dr. Garen Lah stating the following:  will plan repeat echo in 1 year. End of 2023. will add my RN to schedule echo.   Echo order entered. Routing to scheduling to make appointment for end of 2023.

## 2021-04-06 NOTE — Telephone Encounter (Signed)
LMOV  

## 2021-04-20 NOTE — Telephone Encounter (Signed)
Can we reach out to patient again and try to schedule.  Thanks!

## 2021-04-20 NOTE — Telephone Encounter (Signed)
LMOV to schedule  

## 2021-04-27 ENCOUNTER — Other Ambulatory Visit: Payer: 59

## 2021-06-06 ENCOUNTER — Other Ambulatory Visit: Payer: Self-pay | Admitting: Family Medicine

## 2021-06-06 DIAGNOSIS — F901 Attention-deficit hyperactivity disorder, predominantly hyperactive type: Secondary | ICD-10-CM

## 2021-06-06 NOTE — Telephone Encounter (Signed)
Requested medications are due for refill today.  yes  Requested medications are on the active medications list.  yes  Last refill. 04/05/2021  Future visit scheduled.   yes  Notes to clinic.  Medication not delegated. Pharmacy requesting 1 year supply.    Requested Prescriptions  Pending Prescriptions Disp Refills   atomoxetine (STRATTERA) 40 MG capsule [Pharmacy Med Name: ATOMOXETINE  40MG   CAP] 90 capsule 3    Sig: TAKE 1 CAPSULE BY MOUTH DAILY     Not Delegated - Psychiatry: Norepinephrine Reuptake Inhibitor Failed - 06/06/2021  4:54 AM      Failed - This refill cannot be delegated      Passed - Valid encounter within last 6 months    Recent Outpatient Visits           2 months ago SVT (supraventricular tachycardia) [facility])   [facility] [place], Drue Stager, MD   5 months ago SVT (supraventricular tachycardia) [facility])   [facility] [place], Drue Stager, MD   8 months ago SVT (supraventricular tachycardia) [facility])   [facility] Steele Sizer, MD   11 months ago Benign hypertension   [facility] Steele Sizer, MD   1 year ago Benign hypertension   [facility] Steele Sizer, MD       Future Appointments             In 3 weeks Steele Sizer, MD [facility], [facility]

## 2021-06-29 ENCOUNTER — Ambulatory Visit: Payer: 59 | Admitting: Family Medicine

## 2021-07-25 NOTE — Progress Notes (Signed)
Name: Victor Valdez   MRN: [id number]    DOB: 02-19-1966   Date:07/26/2021 ? ?     Progress Note ? ?Subjective ? ?Chief Complaint ? ?Follow Up ? ?HPI ? ?ADD: we stopped Adderal because of episodes of narrow complex tachycardia and syncope that happened Nov 2021. He is now on Strattera , initially felt emotional but now feels fine. It is helping with focus and denies side effects of medications at this time.  ? ?Elevated liver enzymes/Alcoholism in remission: he stopped drinking during [facility] hospital stay back in Nov 2021, he had a short relapse in August 22  but has been off again since that time. He is attending [place] meetings and has been helpful. There is a family history of alcoholism - sister and father . He is still afraid to go back to see Dr. Marius Ditch . Discussed hemochromatosis but he states he knows the reason is the alcoholism. Advised him to have labs redone  ? ?Snoring: we ordered one for Sleep [facility] and not covered, the in home test never got delivered, he states sleeping better and does not want to have study done at this time  ? ?HTN:he is on beta-blocker and Benicar, his bp has been at goal. He denies chest pain, no more palpitation, denies lower extremity edema. He states at local pharmacy bp also at goal . He had mild drop of GFR and needs to have repeat labs  ? ?Insomnia: he was taking Melatonin and Temazepam but still wakes up during the night. He has been taking [place] and is doing well, able to fall and stay asleep. Going to bed around 10:30 and waking up around 6:30 am  ? ?History of syncope and SVT: seeing cardiologist, on Atenolol,only one episode of palpitation since last visit Unchanged  ? ?Anxiety/dysthymia : he was feeling  jittery and anxious during the Summer 22 and we started him on Lexapro. His wife noticed he was not his normal self, sleeping more than usual, lack of motivation , he went to see a psychiatrist in [place] and medication was switched from Lexapro to Wellbutrin and he is feeling  better. Appointments every 3 months.  ? ?Mild dilation of ascending aorta: found on Echo done in 2021 at [facility], Dr. Mylo Red ordered an Echo for him but they could not get a hold of him in the Fall, we will give him Dr. Melvyn Neth number , so he can call schedule it  ? ?Patient Active Problem List  ? Diagnosis Date Noted  ? Mild dilation of ascending aorta (HCC) 04/04/2020  ? Hepatic steatosis 04/04/2020  ? Low serum parathyroid hormone (PTH) 04/04/2020  ? Hypomagnesemia 04/04/2020  ? Hypercalcemia 04/04/2020  ? Narrow complex tachycardia ([place]) 03/30/2020  ? Syncope 03/30/2020  ? Internal hemorrhoids 10/23/2016  ? Diverticulosis 10/23/2016  ? ADD (attention deficit disorder) 11/13/2014  ? Basal cell carcinoma of face 11/13/2014  ? CD (contact dermatitis) 11/13/2014  ? Dyslipidemia 11/13/2014  ? Blood glucose elevated 11/13/2014  ? Benign hypertension 11/13/2014  ? Perennial allergic rhinitis with seasonal variation 11/13/2014  ? Allergic rhinitis 11/13/2014  ? ? ?Past Surgical History:  ?Procedure Laterality Date  ? COLONOSCOPY WITH PROPOFOL N/A 10/08/2016  ? Procedure: COLONOSCOPY WITH PROPOFOL;  Surgeon: Lollie Sails, MD;  Location[facility] ENDOSCOPY;  Service: Endoscopy;  Laterality: N/A;  ? EYE SURGERY Bilateral   ? lasik  ? LASIK Bilateral 12.30.2015  ? ? ?Family History  ?Problem Relation Age of Onset  ? Hypertension Mother   ?  Cancer Father 71  ?     prostate cancer  ? Hypertension Sister   ? Obesity Sister   ? ? ?Social History  ? ?Tobacco Use  ? Smoking status: Never  ? Smokeless tobacco: Former  ?Substance Use Topics  ? Alcohol use: Yes  ?  Alcohol/week: 0.0 standard drinks  ? ? ? ?Current Outpatient Medications:  ?  atenolol (TENORMIN) 25 MG tablet, Take 1 tablet (25 mg total) by mouth every evening., Disp: 90 tablet, Rfl: 1 ?  atomoxetine (STRATTERA) 40 MG capsule, TAKE 1 CAPSULE BY MOUTH DAILY, Disp: 90 capsule, Rfl: 0 ?  buPROPion (WELLBUTRIN XL) 150 MG 24 hr tablet, Take 150 mg by mouth every morning., Disp:  , Rfl:  ?  Daridorexant HCl (QUVIVIQ) 50 MG TABS, Take 1 tablet by mouth every evening., Disp: 90 tablet, Rfl: 1 ?  olmesartan (BENICAR) 20 MG tablet, Take 1 tablet (20 mg total) by mouth daily., Disp: 90 tablet, Rfl: 1 ?  escitalopram (LEXAPRO) 10 MG tablet, Take 1 tablet (10 mg total) by mouth daily. (Patient not taking: Reported on 07/26/2021), Disp: 90 tablet, Rfl: 1 ? ?Allergies  ?Allergen Reactions  ? Aspirin Anaphylaxis  ? Nsaids   ? Penicillins   ? ? ?I personally reviewed active problem list, medication list, allergies, family history, social history, health maintenance with the patient/caregiver today. ? ? ?ROS ? ?Constitutional: Negative for fever , positive for weight change.  ?Respiratory: Negative for cough and shortness of breath.   ?Cardiovascular: Negative for chest pain or palpitations.  ?Gastrointestinal: Negative for abdominal pain, no bowel changes.  ?Musculoskeletal: Negative for gait problem or joint swelling.  ?Skin: Negative for rash.  ?Neurological: Negative for dizziness or headache.  ?No other specific complaints in a complete review of systems (except as listed in HPI above).  ? ?Objective ? ?Vitals:  ? 07/26/21 1021  ?BP: 134/86  ?Pulse: 61  ?Resp: 16  ?SpO2: 96%  ?Weight: 223 lb (101.2 kg)  ?Height: '5\' 10"'$  (1.778 m)  ? ? ?Body mass index is 32 kg/m?. ? ?Physical Exam ? ?Constitutional: Patient appears well-developed and well-nourished. Obese  No distress.  ?HEENT: head atraumatic, normocephalic, pupils equal and reactive to light, neck supple ?Cardiovascular: Normal rate, regular rhythm and normal heart sounds.  No murmur heard. No BLE edema. ?Pulmonary/Chest: Effort normal and breath sounds normal. No respiratory distress. ?Abdominal: Soft.  There is no tenderness. ?Psychiatric: Patient has a normal mood and affect. behavior is normal. Judgment and thought content normal.  ? ?PHQ2/9: ? ?  07/26/2021  ? 10:21 AM 04/05/2021  ? 11:00 AM 01/03/2021  ?  2:10 PM 09/12/2020  ?  8:39 AM 06/13/2020   ?  8:36 AM  ?Depression screen PHQ 2/9  ?Decreased Interest 0 0 2 1 0  ?Down, Depressed, Hopeless 0 0 2 0 0  ?PHQ - 2 Score 0 0 4 1 0  ?Altered sleeping 0 0 '2 3 1  '$ ?Tired, decreased energy 2 0 2 0 0  ?Change in appetite 0 0 0 0 0  ?Feeling bad or failure about yourself  0 0 0 0 0  ?Trouble concentrating 0 0 2 0 1  ?Moving slowly or fidgety/restless 0 0 0 0 0  ?Suicidal thoughts 0 0 0 0 0  ?PHQ-9 Score 2 0 '10 4 2  '$ ?Difficult doing work/chores     Not difficult at all  ?  ?phq 9 is negative ? ? ?Fall Risk: ? ?  07/26/2021  ? 10:21 AM  04/05/2021  ? 11:00 AM 01/03/2021  ?  2:10 PM 09/12/2020  ?  8:39 AM 06/13/2020  ?  8:28 AM  ?Fall Risk   ?Falls in the past year? 0 0 '1 1 1  '$ ?Number falls in past yr: 0 0 0 0 0  ?Injury with Fall? 0 0 0 0 1  ?Risk for fall due to : No Fall Risks No Fall Risks No Fall Risks    ?Follow up Falls prevention discussed Falls prevention discussed Falls prevention discussed    ? ? ? ? ?Functional Status Survey: ?Is the patient deaf or have difficulty hearing?: No ?Does the patient have difficulty seeing, even when wearing glasses/contacts?: No ?Does the patient have difficulty concentrating, remembering, or making decisions?: Yes ?Does the patient have difficulty walking or climbing stairs?: No ?Does the patient have difficulty dressing or bathing?: No ?Does the patient have difficulty doing errands alone such as visiting a doctor's office or shopping?: No ? ? ? ?Assessment & Plan ? ?1. Dyslipidemia ? ? ?2. Alcoholism in remission [facility]) ? ? ?3. Attention deficit hyperactivity disorder (ADHD), predominantly hyperactive type ? ?- atomoxetine (STRATTERA) 40 MG capsule; Take 1 capsule (40 mg total) by mouth daily.  Dispense: 90 capsule; Refill: 1 ? ?4. SVT (supraventricular tachycardia) ([place]) ? ?- atenolol (TENORMIN) 25 MG tablet; Take 1 tablet (25 mg total) by mouth every evening.  Dispense: 90 tablet; Refill: 0 ? ?5. Benign hypertension ? ?- atenolol (TENORMIN) 25 MG tablet; Take 1 tablet (25 mg total)  by mouth every evening.  Dispense: 90 tablet; Refill: 0 ?- olmesartan (BENICAR) 20 MG tablet; Take 1 tablet (20 mg total) by mouth daily.  Dispense: 90 tablet; Refill: 0 ? ?6. Vitamin D deficiency ?

## 2021-07-26 ENCOUNTER — Ambulatory Visit: Payer: 59 | Admitting: Family Medicine

## 2021-07-26 ENCOUNTER — Encounter: Payer: Self-pay | Admitting: Family Medicine

## 2021-07-26 VITALS — BP 134/86 | HR 61 | Resp 16 | Ht 70.0 in | Wt 223.0 lb

## 2021-07-26 DIAGNOSIS — I471 Supraventricular tachycardia: Secondary | ICD-10-CM

## 2021-07-26 DIAGNOSIS — I10 Essential (primary) hypertension: Secondary | ICD-10-CM

## 2021-07-26 DIAGNOSIS — F1021 Alcohol dependence, in remission: Secondary | ICD-10-CM | POA: Diagnosis not present

## 2021-07-26 DIAGNOSIS — E785 Hyperlipidemia, unspecified: Secondary | ICD-10-CM | POA: Diagnosis not present

## 2021-07-26 DIAGNOSIS — I77810 Thoracic aortic ectasia: Secondary | ICD-10-CM

## 2021-07-26 DIAGNOSIS — E559 Vitamin D deficiency, unspecified: Secondary | ICD-10-CM

## 2021-07-26 DIAGNOSIS — G4700 Insomnia, unspecified: Secondary | ICD-10-CM

## 2021-07-26 DIAGNOSIS — K760 Fatty (change of) liver, not elsewhere classified: Secondary | ICD-10-CM

## 2021-07-26 DIAGNOSIS — I6521 Occlusion and stenosis of right carotid artery: Secondary | ICD-10-CM

## 2021-07-26 DIAGNOSIS — F901 Attention-deficit hyperactivity disorder, predominantly hyperactive type: Secondary | ICD-10-CM | POA: Diagnosis not present

## 2021-07-26 MED ORDER — ATENOLOL 25 MG PO TABS: 25.0000 mg | ORAL_TABLET | Freq: Every evening | ORAL | 0 refills | Status: DC

## 2021-07-26 MED ORDER — OLMESARTAN MEDOXOMIL 20 MG PO TABS: 20.0000 mg | ORAL_TABLET | Freq: Every day | ORAL | 0 refills | Status: DC

## 2021-07-26 MED ORDER — QUVIVIQ 50 MG PO TABS: 1.0000 | ORAL_TABLET | Freq: Every evening | ORAL | 0 refills | Status: DC

## 2021-07-26 MED ORDER — ATOMOXETINE HCL 40 MG PO CAPS: 40.0000 mg | ORAL_CAPSULE | Freq: Every day | ORAL | 1 refills | Status: DC

## 2021-07-26 NOTE — Patient Instructions (Signed)
Cardiology: Dr. Mylo Red [phone] ?

## 2021-08-01 ENCOUNTER — Other Ambulatory Visit: Payer: Self-pay

## 2021-08-01 DIAGNOSIS — G4700 Insomnia, unspecified: Secondary | ICD-10-CM

## 2021-08-01 MED ORDER — QUVIVIQ 50 MG PO TABS: 1.0000 | ORAL_TABLET | Freq: Every evening | ORAL | 0 refills | Status: DC

## 2021-08-03 ENCOUNTER — Other Ambulatory Visit: Payer: Self-pay | Admitting: Family Medicine

## 2021-08-03 NOTE — Telephone Encounter (Signed)
Requested medications are due for refill today.  unsure ? ?Requested medications are on the active medications list.  yes ? ?Last refill. 07/10/2021 ? ?Future visit scheduled.   yes ? ?Notes to clinic.  Medication listed as historical. ? ? ? ?Requested Prescriptions  ?Pending Prescriptions Disp Refills  ? buPROPion (WELLBUTRIN XL) 150 MG 24 hr tablet    ?  Sig: Take 1 tablet (150 mg total) by mouth every morning.  ?  ? Psychiatry: Antidepressants - bupropion Failed - 08/03/2021  2:13 PM  ?  ?  Failed - Cr in normal range and within 360 days  ?  Creatinine, Ser  ?Date Value Ref Range Status  ?12/23/2020 1.84 (H) 0.76 - 1.27 mg/dL Final  ?  ?  ?  ?  Failed - AST in normal range and within 360 days  ?  AST  ?Date Value Ref Range Status  ?12/23/2020 177 (H) 0 - 40 IU/L Final  ?  ?  ?  ?  Failed - ALT in normal range and within 360 days  ?  ALT  ?Date Value Ref Range Status  ?12/23/2020 258 (H) 0 - 44 IU/L Final  ?  ?  ?  ?  Passed - Last BP in normal range  ?  BP Readings from Last 1 Encounters:  ?07/26/21 134/86  ?  ?  ?  ?  Passed - Valid encounter within last 6 months  ?  Recent Outpatient Visits   ? ?      ? 1 week ago Dyslipidemia  ? [facility] [place], Drue Stager, MD  ? 4 months ago SVT (supraventricular tachycardia) ([place])  ? [facility], Drue Stager, MD  ? 7 months ago SVT (supraventricular tachycardia) ([place])  ? [facility] [place], Drue Stager, MD  ? 10 months ago SVT (supraventricular tachycardia) ([place])  ? [facility] Steele Sizer, MD  ? 1 year ago Benign hypertension  ? [facility] Steele Sizer, MD  ? ?  ?  ?Future Appointments   ? ?        ? In 4 months Steele Sizer, MD [facility] [place]  ? ?  ? ?  ?  ?  ?  ?

## 2021-08-03 NOTE — Telephone Encounter (Signed)
Copied from [place] [id number]. Topic: Quick Communication - Rx Refill/Question ?>> Aug 03, 2021  9:40 AM Tessa Lerner A wrote: ?Medication: buPROPion (WELLBUTRIN XL) 150 MG 24 hr tablet [[id number]]  ? ?Has the patient contacted their pharmacy? No. This will be the patient's first time using this pharmacy  ?(Agent: If no, request that the patient contact the pharmacy for the refill. If patient does not wish to contact the pharmacy document the reason why and proceed with request.) ?(Agent: If yes, when and what did the pharmacy advise?) ? ?Preferred Pharmacy (with phone number or street name): [place], [place] [address] [address] ?Phone: [phone] Fax: [phone] ?Hours: Not open 24 hours ? ?Has the patient been seen for an appointment in the last year OR does the patient have an upcoming appointment? Yes.   ? ?Agent: Please be advised that RX refills may take up to 3 business days. We ask that you follow-up with your pharmacy. ?

## 2021-08-04 MED ORDER — BUPROPION HCL ER (XL) 150 MG PO TB24: 150.0000 mg | ORAL_TABLET | Freq: Every morning | ORAL | 0 refills | Status: DC

## 2021-08-23 ENCOUNTER — Ambulatory Visit (INDEPENDENT_AMBULATORY_CARE_PROVIDER_SITE_OTHER): Payer: 59

## 2021-08-23 DIAGNOSIS — I77810 Thoracic aortic ectasia: Secondary | ICD-10-CM

## 2021-08-23 LAB — ECHOCARDIOGRAM COMPLETE
AR max vel: 2.37 cm2
AV Area VTI: 2.39 cm2
AV Area mean vel: 2.32 cm2
AV Mean grad: 5 mmHg
AV Peak grad: 8.6 mmHg
Ao pk vel: 1.47 m/s
Area-P 1/2: 4.26 cm2
Calc EF: 59.4 %
S' Lateral: 3.6 cm
Single Plane A2C EF: 61 %
Single Plane A4C EF: 59.3 %

## 2021-08-31 ENCOUNTER — Encounter: Payer: 59 | Admitting: Dermatology

## 2021-09-26 ENCOUNTER — Other Ambulatory Visit: Payer: Self-pay | Admitting: Family Medicine

## 2021-09-26 DIAGNOSIS — I10 Essential (primary) hypertension: Secondary | ICD-10-CM

## 2021-10-18 NOTE — Progress Notes (Signed)
Name: Victor Valdez   MRN: [id number]    DOB: January 19, 1966   Date:10/19/2021       Progress Note  Subjective  Chief Complaint  Medication Refill  HPI  ADD: we stopped Adderal because of episodes of narrow complex tachycardia and syncope that happened Nov 2021. He is now on Strattera , initially felt emotional but now feels fine. It is helping with focus and denies side effects of medications at this time. He is also more calm  ED: he has notice inability to have an erection over the past couple of months. He is not sure of the cause. No new medications. Discussed options and we will start him on Cialis low dose and if no improvement refer to Urologist for further evaluation  Elevated liver enzymes/Alcoholism in remission: he stopped drinking during [facility] hospital stay back in Nov 2021, he had a short relapse in August 22  but has been off again since that time. He is attending [place] meetings at least 5 days a week and has been helpful. There is a family history of alcoholism - sister and father . He is still afraid to go back to see Dr. Marius Ditch . Discussed hemochromatosis but he states he knows the reason of his abnormal liver enzymes is alcoholism. He is willing to have repeat labs done today   Snoring: we ordered one for Sleep [facility] and not covered, the in home test never got delivered, he decided not to have study done at this time   HTN:he is on beta-blocker and Benicar, his bp has been at goal. He denies chest pain, no more palpitation, denies lower extremity edema. We will recheck labs today   Insomnia: he was taking Melatonin and Temazepam but still wakes up during the night. He was taking [place] and was  doing well initially, he was able to fall and stay asleep, however medication stopped working and he is now taking benadryl We will try switching to hydroxyzine   History of syncope and SVT: seeing cardiologist, on Atenolol,only one episode of palpitation since last visit . Unchanged    Anxiety/dysthymia : he was feeling  jittery and anxious during the Summer 22 and we started him on Lexapro. His wife noticed he was not his normal self, sleeping more than usual, lack of motivation , he went to see a psychiatrist in [place] and medication was switched from Lexapro to Wellbutrin and he is doing well. He is no longer seeing psychiatrist and does not feel it is necessary. \  Mild dilation of ascending aorta: found on Echo done in 2021 at [facility], recently seen by Dr. Mylo Red and had repeat echo   Patient Active Problem List   Diagnosis Date Noted   Mild dilation of ascending aorta ([place]) 04/04/2020   Hepatic steatosis 04/04/2020   Low serum parathyroid hormone (PTH) 04/04/2020   Hypomagnesemia 04/04/2020   Hypercalcemia 04/04/2020   Narrow complex tachycardia ([place]) 03/30/2020   Syncope 03/30/2020   Internal hemorrhoids 10/23/2016   Diverticulosis 10/23/2016   ADD (attention deficit disorder) 11/13/2014   Basal cell carcinoma of face 11/13/2014   CD (contact dermatitis) 11/13/2014   Dyslipidemia 11/13/2014   Blood glucose elevated 11/13/2014   Benign hypertension 11/13/2014   Perennial allergic rhinitis with seasonal variation 11/13/2014   Allergic rhinitis 11/13/2014    Past Surgical History:  Procedure Laterality Date   COLONOSCOPY WITH PROPOFOL N/A 10/08/2016   Procedure: COLONOSCOPY WITH PROPOFOL;  Surgeon: Lollie Sails, MD;  Location: [facility] ENDOSCOPY;  Service:  Endoscopy;  Laterality: N/A;   EYE SURGERY Bilateral    lasik   LASIK Bilateral 12.30.2015    Family History  Problem Relation Age of Onset   Hypertension Mother    Cancer Father 64       prostate cancer   Hypertension Sister    Obesity Sister     Social History   Tobacco Use   Smoking status: Never   Smokeless tobacco: Former  Substance Use Topics   Alcohol use: Yes    Alcohol/week: 0.0 standard drinks of alcohol     Current Outpatient Medications:    atenolol (TENORMIN) 25 MG tablet,  Take 1 tablet (25 mg total) by mouth every evening., Disp: 90 tablet, Rfl: 0   atomoxetine (STRATTERA) 40 MG capsule, Take 1 capsule (40 mg total) by mouth daily., Disp: 90 capsule, Rfl: 1   buPROPion (WELLBUTRIN XL) 150 MG 24 hr tablet, Take 1 tablet (150 mg total) by mouth every morning., Disp: 90 tablet, Rfl: 0   olmesartan (BENICAR) 20 MG tablet, TAKE 1 TABLET BY MOUTH DAILY, Disp: 90 tablet, Rfl: 3   Daridorexant HCl (QUVIVIQ) 50 MG TABS, Take 1 tablet by mouth every evening. (Patient not taking: Reported on 10/19/2021), Disp: 90 tablet, Rfl: 0  Allergies  Allergen Reactions   Aspirin Anaphylaxis   Nsaids    Penicillins     I personally reviewed active problem list, medication list, allergies, family history, social history, health maintenance with the patient/caregiver today.   ROS  Constitutional: Negative for fever or weight change.  Respiratory: Negative for cough and shortness of breath.   Cardiovascular: Negative for chest pain or palpitations.  Gastrointestinal: Negative for abdominal pain, no bowel changes.  Musculoskeletal: Negative for gait problem or joint swelling.  Skin: Negative for rash.  Neurological: Negative for dizziness or headache.  No other specific complaints in a complete review of systems (except as listed in HPI above).   Objective  Vitals:   10/19/21 0844  BP: 132/84  Pulse: 80  Resp: 16  SpO2: 98%  Weight: 226 lb (102.5 kg)  Height: '5\' 10"'$  (1.778 m)    Body mass index is 32.43 kg/m.  Physical Exam  Constitutional: Patient appears well-developed and well-nourished. Obese  No distress.  HEENT: head atraumatic, normocephalic, pupils equal and reactive to light, neck supple Cardiovascular: Normal rate, regular rhythm and normal heart sounds.  No murmur heard. No BLE edema. Pulmonary/Chest: Effort normal and breath sounds normal. No respiratory distress. Abdominal: Soft.  There is no tenderness. Psychiatric: Patient has a normal mood and  affect. behavior is normal. Judgment and thought content normal.   Recent Results (from the past 2160 hour(s))  ECHOCARDIOGRAM COMPLETE     Status: None   Collection Time: 08/23/21 10:52 AM  Result Value Ref Range   AR max vel 2.37 cm2   AV Peak grad 8.6 mmHg   Ao pk vel 1.47 m/s   S' Lateral 3.60 cm   Area-P 1/2 4.26 cm2   AV Area VTI 2.39 cm2   AV Mean grad 5.0 mmHg   Single Plane A4C EF 59.3 %   Single Plane A2C EF 61.0 %   Calc EF 59.4 %   AV Area mean vel 2.32 cm2    PHQ2/9:    10/19/2021    8:43 AM 07/26/2021   10:21 AM 04/05/2021   11:00 AM 01/03/2021    2:10 PM 09/12/2020    8:39 AM  Depression screen PHQ 2/9  Decreased Interest 0  0 0 2 1  Down, Depressed, Hopeless 0 0 0 2 0  PHQ - 2 Score 0 0 0 4 1  Altered sleeping 3 0 0 2 3  Tired, decreased energy 0 2 0 2 0  Change in appetite 0 0 0 0 0  Feeling bad or failure about yourself  0 0 0 0 0  Trouble concentrating 0 0 0 2 0  Moving slowly or fidgety/restless 0 0 0 0 0  Suicidal thoughts 0 0 0 0 0  PHQ-9 Score 3 2 0 10 4    phq 9 is negative   Fall Risk:    10/19/2021    8:43 AM 07/26/2021   10:21 AM 04/05/2021   11:00 AM 01/03/2021    2:10 PM 09/12/2020    8:39 AM  Fall Risk   Falls in the past year? 0 0 0 1 1  Number falls in past yr: 0 0 0 0 0  Injury with Fall? 0 0 0 0 0  Risk for fall due to : No Fall Risks No Fall Risks No Fall Risks No Fall Risks   Follow up Falls prevention discussed Falls prevention discussed Falls prevention discussed Falls prevention discussed       Functional Status Survey: Is the patient deaf or have difficulty hearing?: No Does the patient have difficulty seeing, even when wearing glasses/contacts?: No Does the patient have difficulty concentrating, remembering, or making decisions?: No Does the patient have difficulty walking or climbing stairs?: No Does the patient have difficulty dressing or bathing?: No Does the patient have difficulty doing errands alone such as  visiting a doctor's office or shopping?: No    Assessment & Plan  1. SVT (supraventricular tachycardia) (HCC)  - atenolol (TENORMIN) 25 MG tablet; Take 1 tablet (25 mg total) by mouth every evening.  Dispense: 90 tablet; Refill: 1  2. Mild dilation of ascending aorta (HCC)  - Lipid panel  3. Alcoholism in remission [facility])  Doing well  4. Benign hypertension  - atenolol (TENORMIN) 25 MG tablet; Take 1 tablet (25 mg total) by mouth every evening.  Dispense: 90 tablet; Refill: 1 - Comprehensive metabolic panel - CBC with Differential/Platelet  5. Attention deficit hyperactivity disorder (ADHD), predominantly hyperactive type  - atomoxetine (STRATTERA) 40 MG capsule; Take 1 capsule (40 mg total) by mouth daily.  Dispense: 90 capsule; Refill: 1  6. Hepatic steatosis   7. Vitamin D deficiency   8. Calcification of right carotid artery   9. Insomnia, unspecified type  - hydrOXYzine (VISTARIL) 25 MG capsule; Take 1 capsule (25 mg total) by mouth at bedtime.  Dispense: 90 capsule; Refill: 0  10. Elevated ferritin  - Ferritin  11. Other male erectile dysfunction  - tadalafil (CIALIS) 5 MG tablet; Take 1 tablet (5 mg total) by mouth daily.  Dispense: 90 tablet; Refill: 0  12. Dysthymia  - buPROPion (WELLBUTRIN XL) 150 MG 24 hr tablet; Take 1 tablet (150 mg total) by mouth every morning.  Dispense: 90 tablet; Refill: 1

## 2021-10-19 ENCOUNTER — Ambulatory Visit: Payer: 59 | Admitting: Family Medicine

## 2021-10-19 ENCOUNTER — Encounter: Payer: Self-pay | Admitting: Family Medicine

## 2021-10-19 VITALS — BP 132/84 | HR 80 | Resp 16 | Ht 70.0 in | Wt 226.0 lb

## 2021-10-19 DIAGNOSIS — I77810 Thoracic aortic ectasia: Secondary | ICD-10-CM

## 2021-10-19 DIAGNOSIS — N528 Other male erectile dysfunction: Secondary | ICD-10-CM

## 2021-10-19 DIAGNOSIS — I471 Supraventricular tachycardia, unspecified: Secondary | ICD-10-CM

## 2021-10-19 DIAGNOSIS — F1021 Alcohol dependence, in remission: Secondary | ICD-10-CM

## 2021-10-19 DIAGNOSIS — F901 Attention-deficit hyperactivity disorder, predominantly hyperactive type: Secondary | ICD-10-CM

## 2021-10-19 DIAGNOSIS — I10 Essential (primary) hypertension: Secondary | ICD-10-CM

## 2021-10-19 DIAGNOSIS — I6521 Occlusion and stenosis of right carotid artery: Secondary | ICD-10-CM

## 2021-10-19 DIAGNOSIS — E559 Vitamin D deficiency, unspecified: Secondary | ICD-10-CM

## 2021-10-19 DIAGNOSIS — R7989 Other specified abnormal findings of blood chemistry: Secondary | ICD-10-CM

## 2021-10-19 DIAGNOSIS — K760 Fatty (change of) liver, not elsewhere classified: Secondary | ICD-10-CM

## 2021-10-19 DIAGNOSIS — F341 Dysthymic disorder: Secondary | ICD-10-CM

## 2021-10-19 DIAGNOSIS — G4700 Insomnia, unspecified: Secondary | ICD-10-CM

## 2021-10-19 MED ORDER — ATOMOXETINE HCL 40 MG PO CAPS: 40.0000 mg | ORAL_CAPSULE | Freq: Every day | ORAL | 1 refills | Status: DC

## 2021-10-19 MED ORDER — HYDROXYZINE PAMOATE 25 MG PO CAPS
25.0000 mg | ORAL_CAPSULE | Freq: Every day | ORAL | 0 refills | Status: DC
Start: 2021-10-19 — End: 2022-04-27

## 2021-10-19 MED ORDER — TADALAFIL 5 MG PO TABS: 5.0000 mg | ORAL_TABLET | Freq: Every day | ORAL | 0 refills | Status: DC

## 2021-10-19 MED ORDER — BUPROPION HCL ER (XL) 150 MG PO TB24: 150.0000 mg | ORAL_TABLET | Freq: Every morning | ORAL | 1 refills | Status: DC

## 2021-10-19 MED ORDER — ATENOLOL 25 MG PO TABS: 25.0000 mg | ORAL_TABLET | Freq: Every evening | ORAL | 1 refills | Status: DC

## 2021-10-19 NOTE — Patient Instructions (Signed)
Check with insurance if they cover for shingrix vaccine

## 2021-11-08 ENCOUNTER — Other Ambulatory Visit: Payer: Self-pay | Admitting: Family Medicine

## 2021-11-08 DIAGNOSIS — F341 Dysthymic disorder: Secondary | ICD-10-CM

## 2021-12-22 LAB — COMPREHENSIVE METABOLIC PANEL
ALT: 16 IU/L (ref 0–44)
AST: 19 IU/L (ref 0–40)
Albumin/Globulin Ratio: 1.7 (ref 1.2–2.2)
Albumin: 4.1 g/dL (ref 3.8–4.9)
Alkaline Phosphatase: 78 IU/L (ref 44–121)
BUN/Creatinine Ratio: 14 (ref 9–20)
BUN: 20 mg/dL (ref 6–24)
Bilirubin Total: 0.3 mg/dL (ref 0.0–1.2)
CO2: 23 mmol/L (ref 20–29)
Calcium: 9.8 mg/dL (ref 8.7–10.2)
Chloride: 102 mmol/L (ref 96–106)
Creatinine, Ser: 1.44 mg/dL — ABNORMAL HIGH (ref 0.76–1.27)
Globulin, Total: 2.4 g/dL (ref 1.5–4.5)
Glucose: 104 mg/dL — ABNORMAL HIGH (ref 70–99)
Potassium: 4.7 mmol/L (ref 3.5–5.2)
Sodium: 140 mmol/L (ref 134–144)
Total Protein: 6.5 g/dL (ref 6.0–8.5)
eGFR: 57 mL/min/{1.73_m2} — ABNORMAL LOW (ref >59)

## 2021-12-22 LAB — CBC WITH DIFFERENTIAL/PLATELET
Basophils Absolute: 0.1 10*3/uL (ref 0.0–0.2)
Basos: 1 %
EOS (ABSOLUTE): 0.2 10*3/uL (ref 0.0–0.4)
Eos: 4 %
Hematocrit: 35.2 % — ABNORMAL LOW (ref 37.5–51.0)
Hemoglobin: 11.7 g/dL — ABNORMAL LOW (ref 13.0–17.7)
Immature Grans (Abs): 0 10*3/uL (ref 0.0–0.1)
Immature Granulocytes: 0 %
Lymphocytes Absolute: 2 10*3/uL (ref 0.7–3.1)
Lymphs: 33 %
MCH: 27.6 pg (ref 26.6–33.0)
MCHC: 33.2 g/dL (ref 31.5–35.7)
MCV: 83 fL (ref 79–97)
Monocytes Absolute: 0.7 10*3/uL (ref 0.1–0.9)
Monocytes: 12 %
Neutrophils Absolute: 3.1 10*3/uL (ref 1.4–7.0)
Neutrophils: 50 %
Platelets: 238 10*3/uL (ref 150–450)
RBC: 4.24 x10E6/uL (ref 4.14–5.80)
RDW: 13.1 % (ref 11.6–15.4)
WBC: 6.1 10*3/uL (ref 3.4–10.8)

## 2021-12-22 LAB — LIPID PANEL
Chol/HDL Ratio: 6.4 ratio — ABNORMAL HIGH (ref 0.0–5.0)
Cholesterol, Total: 235 mg/dL — ABNORMAL HIGH (ref 100–199)
HDL: 37 mg/dL — ABNORMAL LOW (ref >39)
LDL Chol Calc (NIH): 142 mg/dL — ABNORMAL HIGH (ref 0–99)
Triglycerides: 307 mg/dL — ABNORMAL HIGH (ref 0–149)
VLDL Cholesterol Cal: 56 mg/dL — ABNORMAL HIGH (ref 5–40)

## 2021-12-22 LAB — FERRITIN: Ferritin: 26 ng/mL — ABNORMAL LOW (ref 30–400)

## 2021-12-25 NOTE — Progress Notes (Unsigned)
Name: Victor Valdez   MRN: [id number]    DOB: 10-Oct-1965   Date:12/26/2021       Progress Note  Subjective  Chief Complaint  Follow Up  HPI  ADD: we stopped Adderal because of episodes of narrow complex tachycardia and syncope that happened Nov 2021. He is now on Strattera , initially felt emotional but now feels fine. It is helping with focus and denies side effects of medications at this time. He needs refills.   GERD: he states he was having severe heartburn for about 4 months but over the past 2 weeks he has been taking Nexium otc and symptoms resolved.   Iron deficiency anemia: noticed on labs done last week, colonoscopy is up to date, he has three hemoccult cards at home but needs to drop it off.   ED: he has using Cialis 5 mg daily and it works well but not as hard as it used to be, he will try 10 every other day and let me know what dose works best   Elevated liver enzymes/Alcoholism in remission: he stopped drinking during [place] stay back in Nov 2021, he had a short relapse in August 22  but has been off again since that time. He is attending [place] meetings at least 5 days a week and has been helpful. There is a family history of alcoholism - sister and father . He is willing to go back to see Dr. Marius Ditch   Obesity: he gained a lot of weight since he stopped drinking, BMI is 32 with co-morbidities discussed options and we will try wegovy, he denies personal history of pancreatitis or family history of thyroid cancer. Discussed possible side effects  Snoring: we ordered one for [facility] and not covered, the in home test never got delivered, we will try placing a new referral    HTN:he is on beta-blocker and Benicar, his bp has been at goal. He denies chest pain, no more palpitation, denies lower extremity edema.    Insomnia: he was taking Melatonin and Temazepam but still wakes up during the night. He was taking [place] and was  doing well initially, he was able to fall and stay  asleep, however medication stopped working , he is taking hdyroxizine, he states sleeping a little better, he still wakes up around 3 am and takes about one hour to fall back asleep. Discussed love and kindness meditation   History of syncope and SVT: seeing cardiologist, on Atenolol,only one episode of palpitation since last visit . Stable   Anxiety/dysthymia : he was feeling  jittery and anxious during the Summer 22 and we started him on Lexapro. His wife noticed he was not his normal self, sleeping more than usual, lack of motivation , he went to see a psychiatrist in [place] and medication was switched from Lexapro to Wellbutrin and he is doing well. He is no longer seeing psychiatrist and does not feel it is necessary. We are refilling medication   Mild dilation of ascending aorta: found on Echo done in 2021 at [facility], recently seen by Dr. Mylo Red and had repeat echo ,unchanged   Dyslipidemia: he does not want medications at this time, he will try changing his diet   The 10-year ASCVD risk score (Arnett DK, et al., 2019) is: 11.3%   Values used to calculate the score:     Age: 56 years     Sex: Male     Is Non-Hispanic African American: No     Diabetic:  No     Tobacco smoker: No     Systolic Blood Pressure: 683 mmHg     Is BP treated: Yes     HDL Cholesterol: 37 mg/dL     Total Cholesterol: 235 mg/dL    Patient Active Problem List   Diagnosis Date Noted   Mild dilation of ascending aorta ([place]) 04/04/2020   Hepatic steatosis 04/04/2020   Low serum parathyroid hormone (PTH) 04/04/2020   Hypomagnesemia 04/04/2020   Hypercalcemia 04/04/2020   Narrow complex tachycardia ([place]) 03/30/2020   Syncope 03/30/2020   Internal hemorrhoids 10/23/2016   Diverticulosis 10/23/2016   ADD (attention deficit disorder) 11/13/2014   Basal cell carcinoma of face 11/13/2014   CD (contact dermatitis) 11/13/2014   Dyslipidemia 11/13/2014   Blood glucose elevated 11/13/2014   Benign hypertension 11/13/2014    Perennial allergic rhinitis with seasonal variation 11/13/2014   Allergic rhinitis 11/13/2014    Past Surgical History:  Procedure Laterality Date   COLONOSCOPY WITH PROPOFOL N/A 10/08/2016   Procedure: COLONOSCOPY WITH PROPOFOL;  Surgeon: Lollie Sails, MD;  Location: [facility] ENDOSCOPY;  Service: Endoscopy;  Laterality: N/A;   EYE SURGERY Bilateral    lasik   LASIK Bilateral 12.30.2015    Family History  Problem Relation Age of Onset   Hypertension Mother    Cancer Father 46       prostate cancer   Hypertension Sister    Obesity Sister     Social History   Tobacco Use   Smoking status: Never   Smokeless tobacco: Former  Substance Use Topics   Alcohol use: Yes    Alcohol/week: 0.0 standard drinks of alcohol     Current Outpatient Medications:    atenolol (TENORMIN) 25 MG tablet, Take 1 tablet (25 mg total) by mouth every evening., Disp: 90 tablet, Rfl: 1   atomoxetine (STRATTERA) 40 MG capsule, Take 1 capsule (40 mg total) by mouth daily., Disp: 90 capsule, Rfl: 1   buPROPion (WELLBUTRIN XL) 150 MG 24 hr tablet, Take 1 tablet (150 mg total) by mouth every morning., Disp: 90 tablet, Rfl: 1   hydrOXYzine (VISTARIL) 25 MG capsule, Take 1 capsule (25 mg total) by mouth at bedtime., Disp: 90 capsule, Rfl: 0   olmesartan (BENICAR) 20 MG tablet, TAKE 1 TABLET BY MOUTH DAILY, Disp: 90 tablet, Rfl: 3   tadalafil (CIALIS) 5 MG tablet, Take 1 tablet (5 mg total) by mouth daily., Disp: 90 tablet, Rfl: 0  Allergies  Allergen Reactions   Aspirin Anaphylaxis   Nsaids    Penicillins     I personally reviewed active problem list, medication list, allergies, family history, social history, health maintenance with the patient/caregiver today.   ROS  Constitutional: Negative for fever or weight change.  Respiratory: Negative for cough and shortness of breath.   Cardiovascular: Negative for chest pain or palpitations.  Gastrointestinal: Negative for abdominal pain, no bowel  changes.  Musculoskeletal: Negative for gait problem or joint swelling.  Skin: Negative for rash.  Neurological: Negative for dizziness or headache.  No other specific complaints in a complete review of systems (except as listed in HPI above).   Objective  Vitals:   12/26/21 0819  BP: 136/82  Pulse: 92  Resp: 16  SpO2: 99%  Weight: 225 lb (102.1 kg)  Height: 5' 10"  (1.778 m)    Body mass index is 32.28 kg/m.  Physical Exam  Constitutional: Patient appears well-developed and well-nourished. Obese  No distress.  HEENT: head atraumatic, normocephalic, pupils equal and  reactive to light, neck supple Cardiovascular: Normal rate, regular rhythm and normal heart sounds.  No murmur heard. No BLE edema. Pulmonary/Chest: Effort normal and breath sounds normal. No respiratory distress. Abdominal: Soft.  There is no tenderness. Psychiatric: Patient has a normal mood and affect. behavior is normal. Judgment and thought content normal.   Recent Results (from the past 2160 hour(s))  CBC with Differential/Platelet     Status: Abnormal   Collection Time: 12/21/21  2:54 PM  Result Value Ref Range   WBC 6.1 3.4 - 10.8 x10E3/uL   RBC 4.24 4.14 - 5.80 x10E6/uL   Hemoglobin 11.7 (L) 13.0 - 17.7 g/dL   Hematocrit 35.2 (L) 37.5 - 51.0 %   MCV 83 79 - 97 fL   MCH 27.6 26.6 - 33.0 pg   MCHC 33.2 31.5 - 35.7 g/dL   RDW 13.1 11.6 - 15.4 %   Platelets 238 150 - 450 x10E3/uL   Neutrophils 50 Not Estab. %   Lymphs 33 Not Estab. %   Monocytes 12 Not Estab. %   Eos 4 Not Estab. %   Basos 1 Not Estab. %   Neutrophils Absolute 3.1 1.4 - 7.0 x10E3/uL   Lymphocytes Absolute 2.0 0.7 - 3.1 x10E3/uL   Monocytes Absolute 0.7 0.1 - 0.9 x10E3/uL   EOS (ABSOLUTE) 0.2 0.0 - 0.4 x10E3/uL   Basophils Absolute 0.1 0.0 - 0.2 x10E3/uL   Immature Granulocytes 0 Not Estab. %   Immature Grans (Abs) 0.0 0.0 - 0.1 x10E3/uL  Comprehensive metabolic panel     Status: Abnormal   Collection Time: 12/21/21  2:54 PM   Result Value Ref Range   Glucose 104 (H) 70 - 99 mg/dL   BUN 20 6 - 24 mg/dL   Creatinine, Ser 1.44 (H) 0.76 - 1.27 mg/dL   eGFR 57 (L) >59 mL/min/1.73   BUN/Creatinine Ratio 14 9 - 20   Sodium 140 134 - 144 mmol/L   Potassium 4.7 3.5 - 5.2 mmol/L   Chloride 102 96 - 106 mmol/L   CO2 23 20 - 29 mmol/L   Calcium 9.8 8.7 - 10.2 mg/dL   Total Protein 6.5 6.0 - 8.5 g/dL   Albumin 4.1 3.8 - 4.9 g/dL   Globulin, Total 2.4 1.5 - 4.5 g/dL   Albumin/Globulin Ratio 1.7 1.2 - 2.2   Bilirubin Total 0.3 0.0 - 1.2 mg/dL   Alkaline Phosphatase 78 44 - 121 IU/L   AST 19 0 - 40 IU/L   ALT 16 0 - 44 IU/L  Ferritin     Status: Abnormal   Collection Time: 12/21/21  2:54 PM  Result Value Ref Range   Ferritin 26 (L) 30 - 400 ng/mL  Lipid panel     Status: Abnormal   Collection Time: 12/21/21  2:54 PM  Result Value Ref Range   Cholesterol, Total 235 (H) 100 - 199 mg/dL   Triglycerides 307 (H) 0 - 149 mg/dL   HDL 37 (L) >39 mg/dL   VLDL Cholesterol Cal 56 (H) 5 - 40 mg/dL   LDL Chol Calc (NIH) 142 (H) 0 - 99 mg/dL   Chol/HDL Ratio 6.4 (H) 0.0 - 5.0 ratio    Comment:                                   T. Chol/HDL Ratio  Men  Women                               1/2 Avg.Risk  3.4    3.3                                   Avg.Risk  5.0    4.4                                2X Avg.Risk  9.6    7.1                                3X Avg.Risk 23.4   11.0     PHQ2/9:    12/26/2021    8:18 AM 10/19/2021    8:43 AM 07/26/2021   10:21 AM 04/05/2021   11:00 AM 01/03/2021    2:10 PM  Depression screen PHQ 2/9  Decreased Interest 0 0 0 0 2  Down, Depressed, Hopeless 0 0 0 0 2  PHQ - 2 Score 0 0 0 0 4  Altered sleeping 0 3 0 0 2  Tired, decreased energy 0 0 2 0 2  Change in appetite 0 0 0 0 0  Feeling bad or failure about yourself  0 0 0 0 0  Trouble concentrating 3 0 0 0 2  Moving slowly or fidgety/restless 0 0 0 0 0  Suicidal thoughts 0 0 0 0 0  PHQ-9  Score 3 3 2  0 10    phq 9 is negative   Fall Risk:    12/26/2021    8:18 AM 10/19/2021    8:43 AM 07/26/2021   10:21 AM 04/05/2021   11:00 AM 01/03/2021    2:10 PM  Fall Risk   Falls in the past year? 0 0 0 0 1  Number falls in past yr: 0 0 0 0 0  Injury with Fall? 0 0 0 0 0  Risk for fall due to : No Fall Risks No Fall Risks No Fall Risks No Fall Risks No Fall Risks  Follow up Falls prevention discussed Falls prevention discussed Falls prevention discussed Falls prevention discussed Falls prevention discussed    Functional Status Survey: Is the patient deaf or have difficulty hearing?: No Does the patient have difficulty seeing, even when wearing glasses/contacts?: No Does the patient have difficulty concentrating, remembering, or making decisions?: Yes Does the patient have difficulty walking or climbing stairs?: No Does the patient have difficulty dressing or bathing?: No Does the patient have difficulty doing errands alone such as visiting a doctor's office or shopping?: No    Assessment & Plan  1. Alcoholism in remission [facility])  Doing well   2. Mild dilation of ascending aorta (HCC)   3. SVT (supraventricular tachycardia) (HCC)  Controlled with Atenolol   4. Benign hypertension   5. Attention deficit hyperactivity disorder (ADHD), predominantly hyperactive type   6. Need for hepatitis B vaccination  - Heplisav-B (HepB-CPG) Vaccine  7. Need for immunization against influenza  - Flu Vaccine QUAD 6+ mos PF IM (Fluarix Quad PF)  8. Need for hepatitis A vaccination  - Hepatitis A vaccine adult IM  9. Calcification of right carotid artery   10. Other male erectile  dysfunction  - tadalafil (CIALIS) 5 MG tablet; Take 1 tablet (5 mg total) by mouth daily.  Dispense: 90 tablet; Refill: 1  11. Dyslipidemia  He does not want to start medications at this time   12. Snoring  - Ambulatory referral to Sleep Studies  13. Obesity (BMI 30.0-34.9)  -  Semaglutide-Weight Management 0.25 MG/0.5ML SOAJ; Inject 0.25 mg into the skin once a week for 28 days.  Dispense: 2 mL; Refill: 0 - Semaglutide-Weight Management 0.5 MG/0.5ML SOAJ; Inject 0.5 mg into the skin once a week for 28 days.  Dispense: 2 mL; Refill: 0 - Semaglutide-Weight Management 1 MG/0.5ML SOAJ; Inject 1 mg into the skin once a week for 28 days.  Dispense: 2 mL; Refill: 0   16. Stage 3a chronic kidney disease (HCC)  GFR improved, discussed avoiding nsaid's and drinking water  17. Hepatic steatosis   He will follow up with GI now

## 2021-12-26 ENCOUNTER — Encounter: Payer: Self-pay | Admitting: Family Medicine

## 2021-12-26 ENCOUNTER — Ambulatory Visit: Payer: 59 | Admitting: Family Medicine

## 2021-12-26 VITALS — BP 136/82 | HR 92 | Resp 16 | Ht 70.0 in | Wt 225.0 lb

## 2021-12-26 DIAGNOSIS — I471 Supraventricular tachycardia, unspecified: Secondary | ICD-10-CM

## 2021-12-26 DIAGNOSIS — I6521 Occlusion and stenosis of right carotid artery: Secondary | ICD-10-CM

## 2021-12-26 DIAGNOSIS — N1831 Chronic kidney disease, stage 3a: Secondary | ICD-10-CM

## 2021-12-26 DIAGNOSIS — Z23 Encounter for immunization: Secondary | ICD-10-CM | POA: Diagnosis not present

## 2021-12-26 DIAGNOSIS — F1021 Alcohol dependence, in remission: Secondary | ICD-10-CM | POA: Diagnosis not present

## 2021-12-26 DIAGNOSIS — I10 Essential (primary) hypertension: Secondary | ICD-10-CM

## 2021-12-26 DIAGNOSIS — K760 Fatty (change of) liver, not elsewhere classified: Secondary | ICD-10-CM

## 2021-12-26 DIAGNOSIS — K219 Gastro-esophageal reflux disease without esophagitis: Secondary | ICD-10-CM | POA: Diagnosis not present

## 2021-12-26 DIAGNOSIS — I77810 Thoracic aortic ectasia: Secondary | ICD-10-CM | POA: Diagnosis not present

## 2021-12-26 DIAGNOSIS — N528 Other male erectile dysfunction: Secondary | ICD-10-CM

## 2021-12-26 DIAGNOSIS — E669 Obesity, unspecified: Secondary | ICD-10-CM

## 2021-12-26 DIAGNOSIS — D509 Iron deficiency anemia, unspecified: Secondary | ICD-10-CM

## 2021-12-26 DIAGNOSIS — E785 Hyperlipidemia, unspecified: Secondary | ICD-10-CM

## 2021-12-26 DIAGNOSIS — F901 Attention-deficit hyperactivity disorder, predominantly hyperactive type: Secondary | ICD-10-CM

## 2021-12-26 DIAGNOSIS — E66811 Obesity, class 1: Secondary | ICD-10-CM

## 2021-12-26 DIAGNOSIS — R0683 Snoring: Secondary | ICD-10-CM

## 2021-12-26 MED ORDER — SEMAGLUTIDE-WEIGHT MANAGEMENT 1 MG/0.5ML ~~LOC~~ SOAJ: 1.0000 mg | SUBCUTANEOUS | 0 refills | Status: DC

## 2021-12-26 MED ORDER — SEMAGLUTIDE-WEIGHT MANAGEMENT 0.25 MG/0.5ML ~~LOC~~ SOAJ: 0.2500 mg | SUBCUTANEOUS | 0 refills | Status: AC

## 2021-12-26 MED ORDER — TADALAFIL 5 MG PO TABS: 5.0000 mg | ORAL_TABLET | Freq: Every day | ORAL | 1 refills | Status: DC

## 2021-12-26 MED ORDER — SEMAGLUTIDE-WEIGHT MANAGEMENT 0.5 MG/0.5ML ~~LOC~~ SOAJ: 0.5000 mg | SUBCUTANEOUS | 0 refills | Status: AC

## 2021-12-26 NOTE — Patient Instructions (Addendum)
Ask insurance if they cover any weight loss medication  Victor Valdez  [place]  May you be happy May you be healthy May you be safe

## 2021-12-29 ENCOUNTER — Telehealth: Payer: Self-pay | Admitting: Family Medicine

## 2021-12-29 NOTE — Telephone Encounter (Signed)
Patient called in states, insurance is not covering, Semaglutide-Weight Management 0.25 MG/0.5ML . Maybe alternative? Please call back for further assistance

## 2022-01-16 ENCOUNTER — Ambulatory Visit: Payer: 59 | Admitting: Family Medicine

## 2022-03-11 ENCOUNTER — Other Ambulatory Visit: Payer: Self-pay | Admitting: Family Medicine

## 2022-03-11 DIAGNOSIS — F341 Dysthymic disorder: Secondary | ICD-10-CM

## 2022-03-11 DIAGNOSIS — F419 Anxiety disorder, unspecified: Secondary | ICD-10-CM

## 2022-03-12 ENCOUNTER — Other Ambulatory Visit: Payer: Self-pay

## 2022-03-12 NOTE — Telephone Encounter (Signed)
Last seen 8/22 has an appt12/22

## 2022-03-19 ENCOUNTER — Other Ambulatory Visit: Payer: Self-pay | Admitting: Family Medicine

## 2022-03-19 DIAGNOSIS — E669 Obesity, unspecified: Secondary | ICD-10-CM

## 2022-03-19 NOTE — Telephone Encounter (Signed)
Medication Refill - Medication: Semaglutide-Weight Management [facility])  Pt stated he just finished '1MG'$ .   Has the patient contacted their pharmacy? No. No more refills .  (Agent: If no, request that the patient contact the pharmacy for the refill. If patient does not wish to contact the pharmacy document the reason why and proceed with request.)   Preferred Pharmacy (with phone number or street name):  [place], [place] - [address]  [place] [address]  Phone: [phone] Fax: [phone]  Hours: Not open 24 hours   Has the patient been seen for an appointment in the last year OR does the patient have an upcoming appointment? Yes.    Agent: Please be advised that RX refills may take up to 3 business days. We ask that you follow-up with your pharmacy.

## 2022-03-20 NOTE — Telephone Encounter (Signed)
Requested medications are due for refill today.  unsure  Requested medications are on the active medications list.  yes  Last refill. 02/22/2022 44m 0 rf  Future visit scheduled.   yes  Notes to clinic.  Rx written to expire 03/22/2022. Please review.    Requested Prescriptions  Pending Prescriptions Disp Refills   Semaglutide-Weight Management 1 MG/0.5ML SOAJ 2 mL 0    Sig: Inject 1 mg into the skin once a week for 28 days.     Endocrinology:  Diabetes - GLP-1 Receptor Agonists - semaglutide Failed - 03/19/2022  4:14 PM      Failed - HBA1C in normal range and within 180 days    Hemoglobin A1C  Date Value Ref Range Status  01/05/2016 5.3  Final   Hgb A1c MFr Bld  Date Value Ref Range Status  04/04/2020 5.2 4.8 - 5.6 % Final    Comment:             Prediabetes: 5.7 - 6.4          Diabetes: >6.4          Glycemic control for adults with diabetes: <7.0          Failed - Cr in normal range and within 360 days    Creatinine, Ser  Date Value Ref Range Status  12/21/2021 1.44 (H) 0.76 - 1.27 mg/dL Final         Passed - Valid encounter within last 6 months    Recent Outpatient Visits           2 months ago Gastroesophageal reflux disease without esophagitis   [facility]   5 months ago Mild dilation of ascending aorta ([facility]   [facility]   7 months [facility]ys[facility]   11 months [facility]   [facility]   1 year ago SVT (supraventricular tachycardia) ([facility]   [facility]       Future Appointments             In 1 month SAn[facility]  In 3 months [facility]

## 2022-03-21 MED ORDER — SEMAGLUTIDE-WEIGHT MANAGEMENT 1 MG/0.5ML ~~LOC~~ SOAJ: 1.0000 mg | SUBCUTANEOUS | 0 refills | Status: DC

## 2022-03-23 ENCOUNTER — Other Ambulatory Visit: Payer: Self-pay | Admitting: Family Medicine

## 2022-03-23 DIAGNOSIS — F901 Attention-deficit hyperactivity disorder, predominantly hyperactive type: Secondary | ICD-10-CM

## 2022-03-24 IMAGING — DX DG CERVICAL SPINE COMPLETE 4+V
6 series · 6 of 6 positions shown · non-contrast
Comparison: None.

CLINICAL DATA: Neck pain with right-sided radiculopathy

EXAM:
CERVICAL SPINE - COMPLETE 4+ VIEW

[c-spine lat]
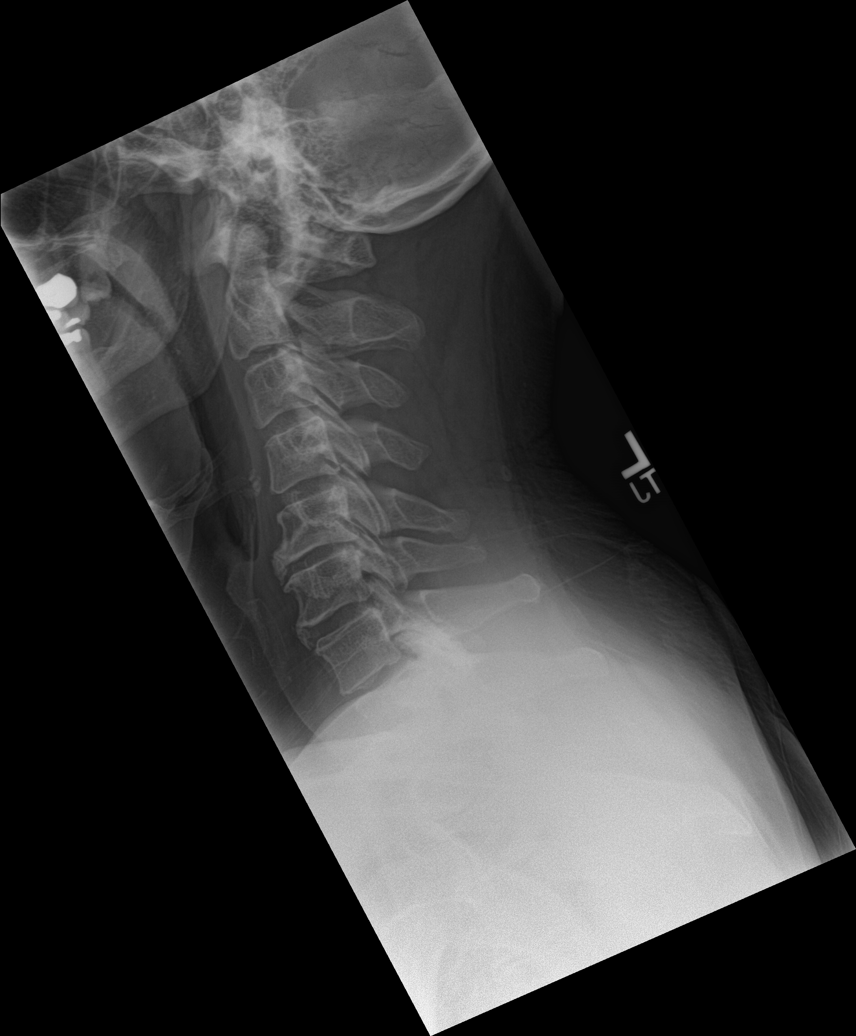

[c-spine obl (1 of 2)]
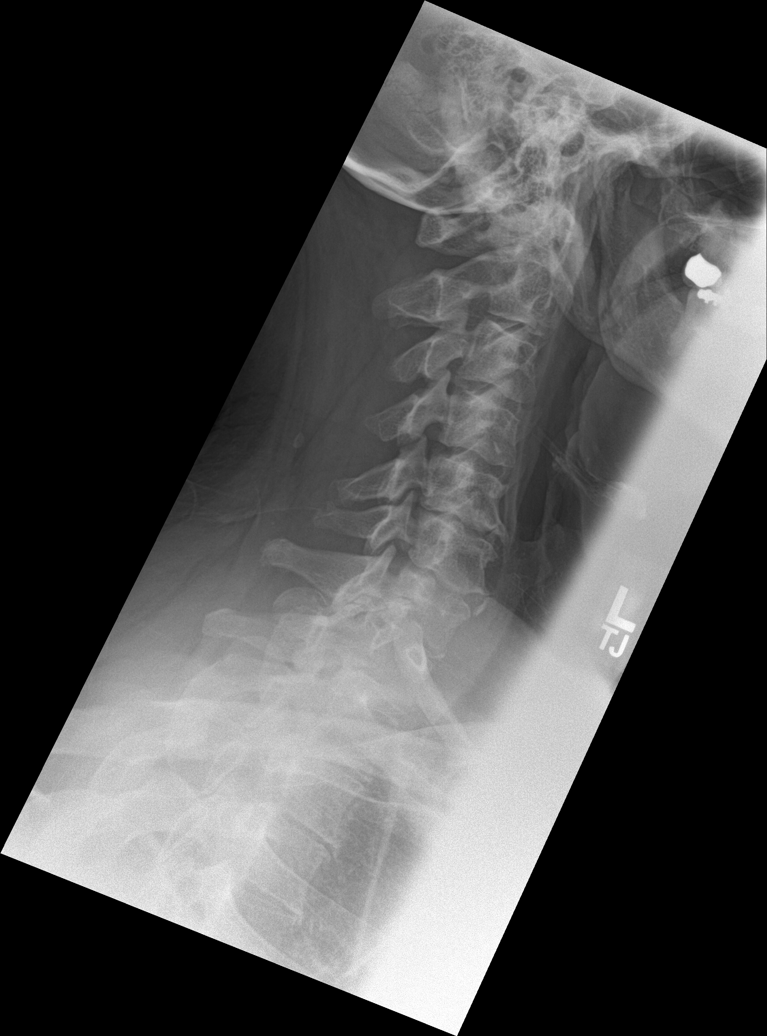

[c-spine obl (2 of 2)]
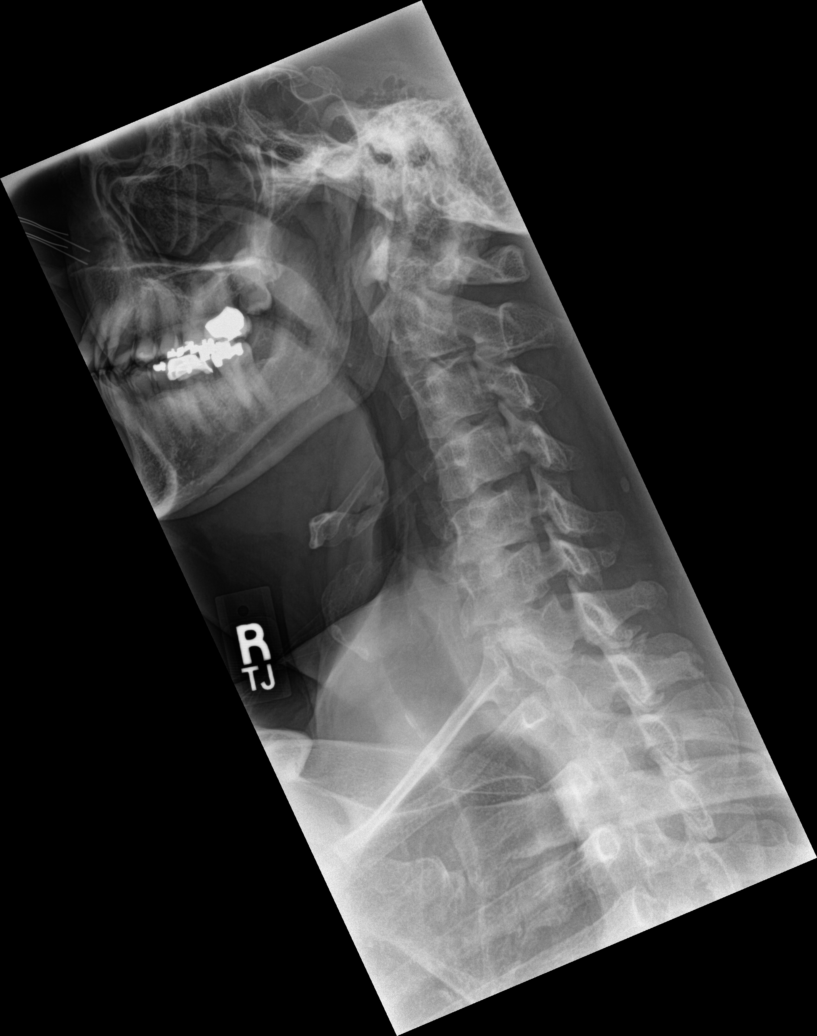

[c-spine ap]
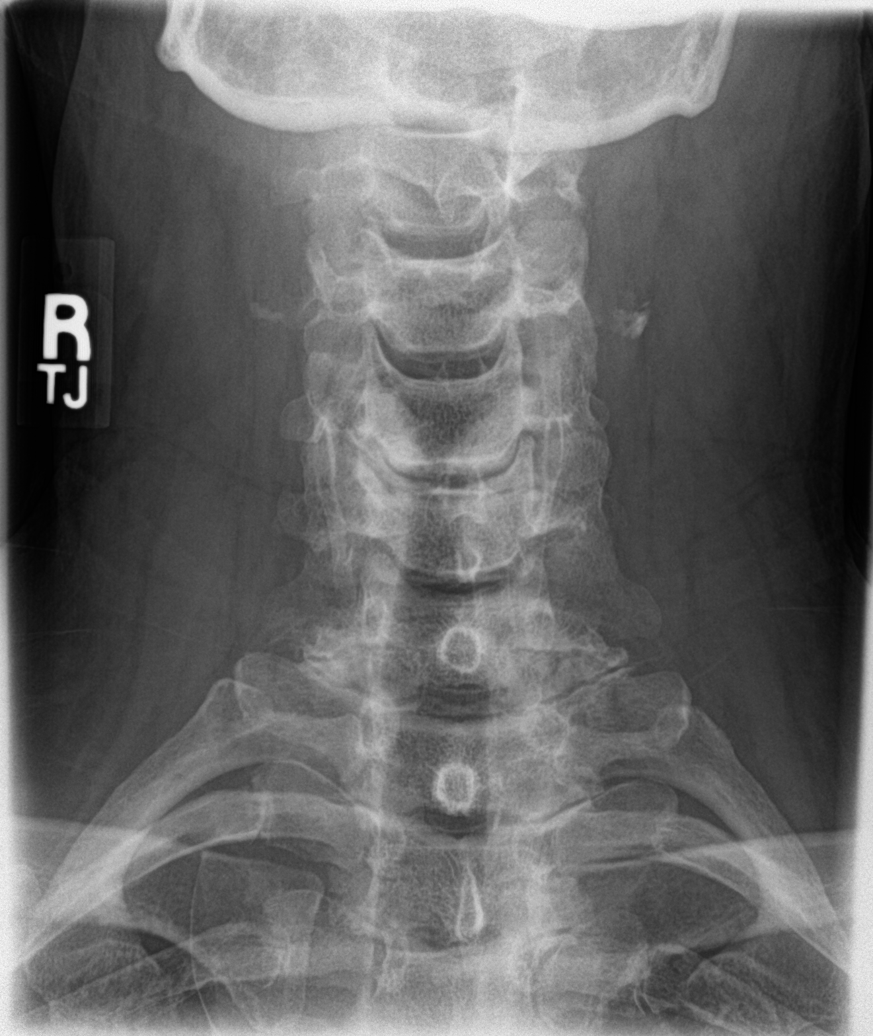

[c-spine open mouth]
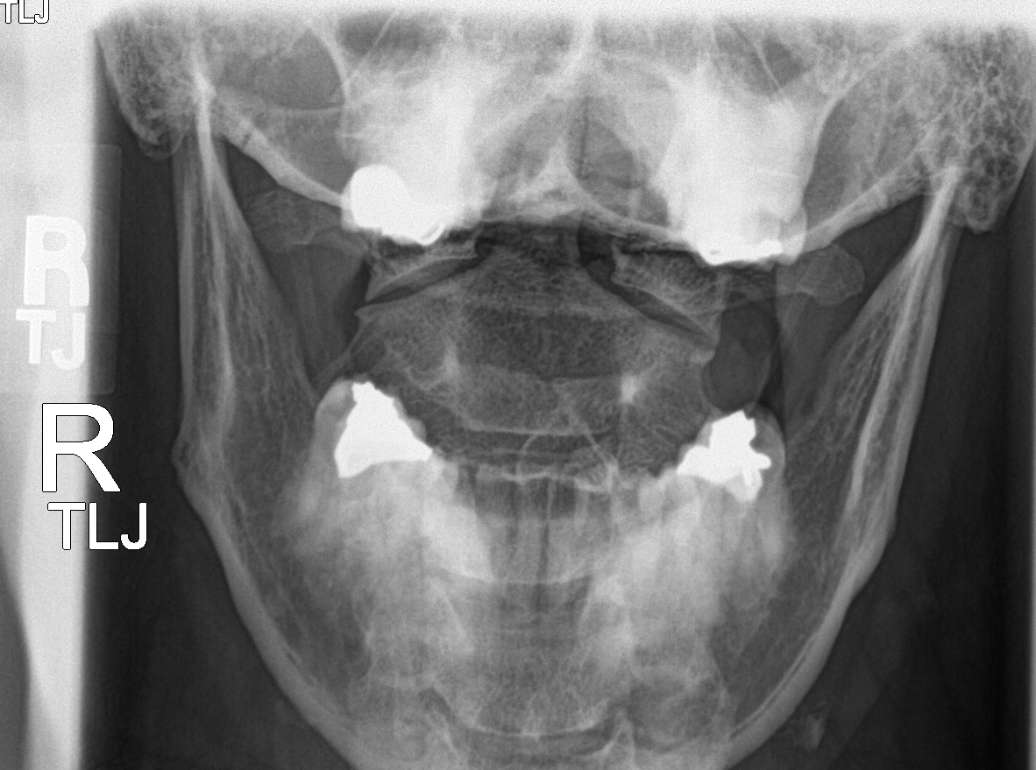

[c-spine swimmers]
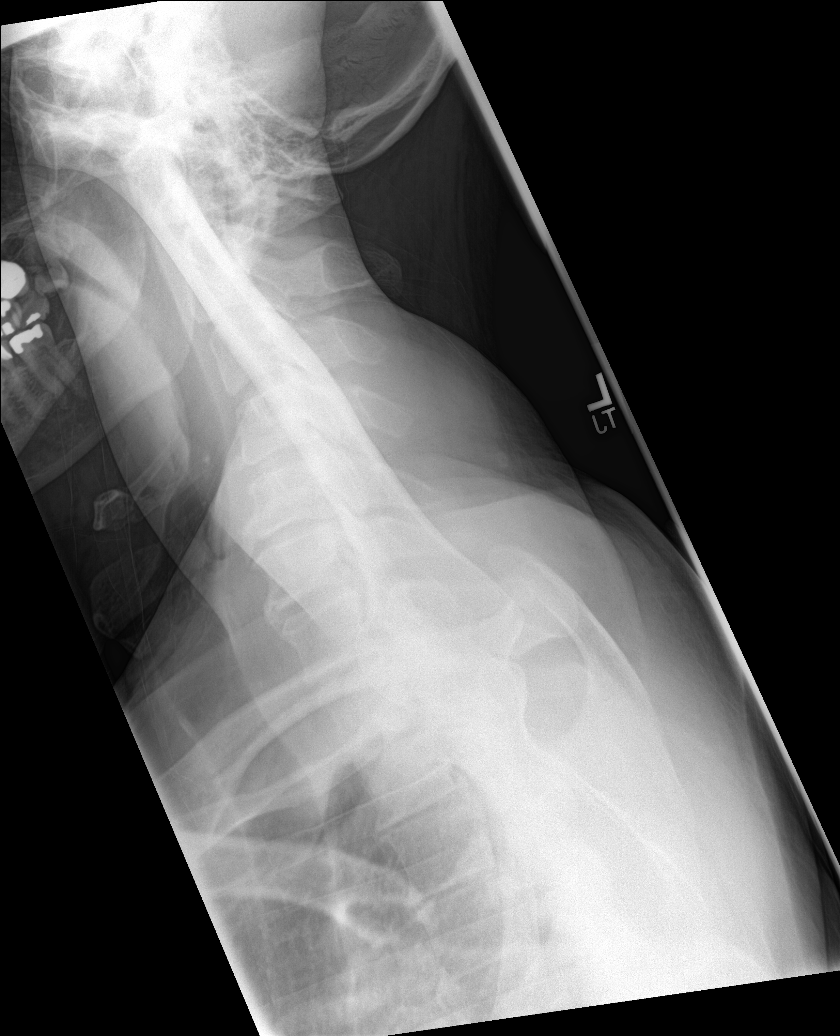

[6 of 6 positions shown; findings below may reference images not displayed]

FINDINGS: There is no evidence of cervical spine fracture or prevertebral soft
tissue swelling. Straightening of the cervical lordosis without
static listhesis. Mild disc height loss at C5-6. Endplate spurring
is noted. Oblique views reveal multilevel narrowing of the bilateral
foramina most pronounced on the left at C3-4. Bilateral carotid
atherosclerosis.
IMPRESSION: 1. Multilevel degenerative changes of the cervical spine with
bilateral foraminal narrowing, most pronounced on the left at C3-4.
2. No acute findings.

## 2022-04-16 ENCOUNTER — Other Ambulatory Visit: Payer: Self-pay | Admitting: Family Medicine

## 2022-04-16 DIAGNOSIS — E669 Obesity, unspecified: Secondary | ICD-10-CM

## 2022-04-16 DIAGNOSIS — E66811 Obesity, class 1: Secondary | ICD-10-CM

## 2022-04-26 ENCOUNTER — Other Ambulatory Visit: Payer: Self-pay | Admitting: Family Medicine

## 2022-04-26 DIAGNOSIS — I471 Supraventricular tachycardia, unspecified: Secondary | ICD-10-CM

## 2022-04-26 DIAGNOSIS — I10 Essential (primary) hypertension: Secondary | ICD-10-CM

## 2022-04-26 NOTE — Progress Notes (Signed)
Name: Victor Valdez   MRN: [id number]    DOB: 1965-10-12   Date:04/27/2022       Progress Note  Subjective  Chief Complaint  Follow Up  HPI   ADD: we stopped Adderal because of episodes of narrow complex tachycardia and syncope that happened Nov 2021. He is now on Strattera , initially felt emotional but it resolved quickly. The medication helps him with focus and denies side effects of medications at this time  CKI stage III: no pruritus, good urine output, we will recheck labs .  GERD: he states he was having severe heartburn for about 4 months but over the past 2 weeks he has been taking Nexium otc and symptoms resolved.   Iron deficiency anemia: noticed on labs done last week, colonoscopy is up to date, he has three hemoccult cards at home but needs to drop it off.   BM:WUXLKG 10 mg is not working, we will try 20 mg dose and if it does not work we will refer him to urologist   Elevated liver enzymes/Alcoholism in remission: he stopped drinking during [facility] hospital stay back in Nov 2021, he had a short relapse in August 22  but has been off again since that time. . There is a family history of alcoholism - sister and father He is doing well   Obesity: he gained a lot of weight since he stopped drinking,we started him on Wegovy and he is doing well, down 16 lbs, tolerating medication well , we will send refills to pharmacy   Snoring: we ordered one for [facility] and not covered, the in home test never got delivered, last referral done to [facility] in August and not done yet   HTN:he is on beta-blocker and Benicar, his bp has been at goal. He denies chest pain, no more palpitation, denies lower extremity edema.    Insomnia: he was taking Melatonin and Temazepam but still wakes up during the night. He was taking [place] and was  doing well initially, he was able to fall and stay asleep, however medication stopped working , he is taking hdyroxizine, he states sleeping a little better, he  still wakes up around 3 am and takes about one hour to fall back asleep.   History of syncope and SVT: seeing cardiologist, on Atenolol episodes of palpitation are rare now   Anxiety/dysthymia : he was feeling  jittery and anxious during the Summer 22 and we started him on Lexapro. His wife noticed he was not his normal self, sleeping more than usual, lack of motivation , he went to see a psychiatrist in [place] and medication was switched from Lexapro to Wellbutrin and he is doing well. He is no longer seeing psychiatrist and does not feel it is necessary. We are refilling medication from now on but discussed therapy    Mild dilation of ascending aorta: found on Echo done in 2021 at [facility], recently seen by Dr. Myriam Forehand and had repeat echo . Unchanged   Dyslipidemia: he does not want to take statins but willing to try vascepa   The 10-year ASCVD risk score (Arnett DK, et al., 2019) is: 10.2%   Values used to calculate the score:     Age: 56 years     Sex: Male     Is Non-Hispanic African American: No     Diabetic: No     Tobacco smoker: No     Systolic Blood Pressure: 128 mmHg     Is BP treated: Yes  HDL Cholesterol: 37 mg/dL     Total Cholesterol: 235 mg/dL   Patient Active Problem List   Diagnosis Date Noted   Iron deficiency anemia 12/26/2021   Gastroesophageal reflux disease without esophagitis 12/26/2021   Obesity (BMI 30.0-34.9) 12/26/2021   Snoring 12/26/2021   Stage 3a chronic kidney disease (HCC) 12/26/2021   Mild dilation of ascending aorta (HCC) 04/04/2020   Hepatic steatosis 04/04/2020   Low serum parathyroid hormone (PTH) 04/04/2020   Hypomagnesemia 04/04/2020   Hypercalcemia 04/04/2020   Narrow complex tachycardia 03/30/2020   Syncope 03/30/2020   Internal hemorrhoids 10/23/2016   Diverticulosis 10/23/2016   ADD (attention deficit disorder) 11/13/2014   Basal cell carcinoma of face 11/13/2014   CD (contact dermatitis) 11/13/2014   Dyslipidemia 11/13/2014   Blood  glucose elevated 11/13/2014   Benign hypertension 11/13/2014   Perennial allergic rhinitis with seasonal variation 11/13/2014   Allergic rhinitis 11/13/2014    Past Surgical History:  Procedure Laterality Date   COLONOSCOPY WITH PROPOFOL N/A 10/08/2016   Procedure: COLONOSCOPY WITH PROPOFOL;  Surgeon: Christena Deem, MD;  Location: [facility] ENDOSCOPY;  Service: Endoscopy;  Laterality: N/A;   EYE SURGERY Bilateral    lasik   LASIK Bilateral 12.30.2015    Family History  Problem Relation Age of Onset   Hypertension Mother    Cancer Father 64       prostate cancer   Hypertension Sister    Obesity Sister     Social History   Tobacco Use   Smoking status: Never   Smokeless tobacco: Former  Substance Use Topics   Alcohol use: Yes    Alcohol/week: 0.0 standard drinks of alcohol     Current Outpatient Medications:    atenolol (TENORMIN) 25 MG tablet, Take 1 tablet (25 mg total) by mouth every evening., Disp: 90 tablet, Rfl: 1   atomoxetine (STRATTERA) 40 MG capsule, TAKE 1 CAPSULE BY MOUTH DAILY, Disp: 90 capsule, Rfl: 0   buPROPion (WELLBUTRIN XL) 150 MG 24 hr tablet, TAKE 1 TABLET BY MOUTH IN THE  MORNING, Disp: 90 tablet, Rfl: 0   esomeprazole (NEXIUM) 20 MG capsule, Take 20 mg by mouth daily at 12 noon., Disp: , Rfl:    hydrOXYzine (VISTARIL) 25 MG capsule, Take 1 capsule (25 mg total) by mouth at bedtime., Disp: 90 capsule, Rfl: 0   olmesartan (BENICAR) 20 MG tablet, TAKE 1 TABLET BY MOUTH DAILY, Disp: 90 tablet, Rfl: 3   tadalafil (CIALIS) 5 MG tablet, Take 1 tablet (5 mg total) by mouth daily., Disp: 90 tablet, Rfl: 1   WEGOVY 1 MG/0.5ML SOAJ, INJECT 1MG  INTO THE SKIN ONCE WEEKLY FOR28 DAYS AS DIRECTED, Disp: 2 mL, Rfl: 0   escitalopram (LEXAPRO) 10 MG tablet, TAKE 1 TABLET BY MOUTH DAILY (Patient not taking: Reported on 04/27/2022), Disp: 90 tablet, Rfl: 0  Allergies  Allergen Reactions   Aspirin Anaphylaxis   Nsaids    Penicillins     I personally reviewed active  problem list, medication list, allergies, family history, social history, health maintenance with the patient/caregiver today.   ROS  Ten systems reviewed and is negative except as mentioned in HPI   Objective  Vitals:   04/27/22 0751  BP: 128/72  Pulse: 93  Resp: 16  SpO2: 98%  Weight: 211 lb (95.7 kg)  Height: 5\' 9"  (1.753 m)    Body mass index is 31.16 kg/m.  Physical Exam  Constitutional: Patient appears well-developed and well-nourished. Obese  No distress.  HEENT: head atraumatic, normocephalic,  pupils equal and reactive to light, neck supple Cardiovascular: Normal rate, regular rhythm and normal heart sounds.  No murmur heard. No BLE edema. Pulmonary/Chest: Effort normal and breath sounds normal. No respiratory distress. Abdominal: Soft.  There is no tenderness. Psychiatric: Patient has a normal mood and affect. behavior is normal. Judgment and thought content normal.   PHQ2/9:    04/27/2022    7:51 AM 12/26/2021    8:18 AM 10/19/2021    8:43 AM 07/26/2021   10:21 AM 04/05/2021   11:00 AM  Depression screen PHQ 2/9  Decreased Interest 0 0 0 0 0  Down, Depressed, Hopeless 0 0 0 0 0  PHQ - 2 Score 0 0 0 0 0  Altered sleeping 0 0 3 0 0  Tired, decreased energy 0 0 0 2 0  Change in appetite 0 0 0 0 0  Feeling bad or failure about yourself  0 0 0 0 0  Trouble concentrating 0 3 0 0 0  Moving slowly or fidgety/restless 0 0 0 0 0  Suicidal thoughts 0 0 0 0 0  PHQ-9 Score 0 3 3 2  0    phq 9 is negative   Fall Risk:    04/27/2022    7:51 AM 12/26/2021    8:18 AM 10/19/2021    8:43 AM 07/26/2021   10:21 AM 04/05/2021   11:00 AM  Fall Risk   Falls in the past year? 0 0 0 0 0  Number falls in past yr: 0 0 0 0 0  Injury with Fall? 0 0 0 0 0  Risk for fall due to : No Fall Risks No Fall Risks No Fall Risks No Fall Risks No Fall Risks  Follow up Falls prevention discussed Falls prevention discussed Falls prevention discussed Falls prevention discussed Falls  prevention discussed      Functional Status Survey: Is the patient deaf or have difficulty hearing?: No Does the patient have difficulty seeing, even when wearing glasses/contacts?: No Does the patient have difficulty concentrating, remembering, or making decisions?: No Does the patient have difficulty walking or climbing stairs?: No Does the patient have difficulty dressing or bathing?: No Does the patient have difficulty doing errands alone such as visiting a doctor's office or shopping?: No    Assessment & Plan  1. Dyslipidemia  - icosapent Ethyl (VASCEPA) 1 g capsule; Take 2 capsules (2 g total) by mouth 2 (two) times daily.  Dispense: 360 capsule; Refill: 1 - Lipid panel  2. Benign hypertension  - atenolol (TENORMIN) 25 MG tablet; Take 1 tablet (25 mg total) by mouth every evening.  Dispense: 90 tablet; Refill: 1 - Comprehensive metabolic panel  3. SVT (supraventricular tachycardia)  - atenolol (TENORMIN) 25 MG tablet; Take 1 tablet (25 mg total) by mouth every evening.  Dispense: 90 tablet; Refill: 1  4. Attention deficit hyperactivity disorder (ADHD), predominantly hyperactive type  - atomoxetine (STRATTERA) 40 MG capsule; Take 1 capsule (40 mg total) by mouth daily.  Dispense: 90 capsule; Refill: 1  5. Dysthymia  - buPROPion (WELLBUTRIN XL) 150 MG 24 hr tablet; Take 1 tablet (150 mg total) by mouth every morning.  Dispense: 90 tablet; Refill: 1  6. Insomnia, unspecified type  - hydrOXYzine (VISTARIL) 25 MG capsule; Take 1 capsule (25 mg total) by mouth at bedtime.  Dispense: 90 capsule; Refill: 1  7. Obesity (BMI 30.0-34.9)  Discussed with the patient the risk posed by an increased BMI. Discussed importance of portion control, calorie counting and  at least 150 minutes of physical activity weekly. Avoid sweet beverages and drink more water. Eat at least 6 servings of fruit and vegetables daily   - Semaglutide-Weight Management (WEGOVY) 1 MG/0.5ML SOAJ; Inject 1 mg  into the skin once a week.  Dispense: 6 mL; Refill: 0  8. Stage 3a chronic kidney disease (HCC)  - Comprehensive metabolic panel  9. Alcoholism in remission [facility])  Doing well   10. Mild dilation of ascending aorta (HCC)   11. ED (erectile dysfunction) of non-organic origin  - tadalafil (CIALIS) 20 MG tablet; Take 1 tablet (20 mg total) by mouth every other day as needed for erectile dysfunction.  Dispense: 30 tablet; Refill: 1  12. Iron deficiency anemia, unspecified iron deficiency anemia type  - Ambulatory referral to Gastroenterology - CBC with Differential/Platelet - Iron, TIBC and Ferritin Panel

## 2022-04-27 ENCOUNTER — Ambulatory Visit: Payer: 59 | Admitting: Family Medicine

## 2022-04-27 ENCOUNTER — Encounter: Payer: Self-pay | Admitting: Family Medicine

## 2022-04-27 VITALS — BP 128/72 | HR 93 | Resp 16 | Ht 69.0 in | Wt 211.0 lb

## 2022-04-27 DIAGNOSIS — I471 Supraventricular tachycardia, unspecified: Secondary | ICD-10-CM

## 2022-04-27 DIAGNOSIS — G4700 Insomnia, unspecified: Secondary | ICD-10-CM

## 2022-04-27 DIAGNOSIS — F1021 Alcohol dependence, in remission: Secondary | ICD-10-CM

## 2022-04-27 DIAGNOSIS — F901 Attention-deficit hyperactivity disorder, predominantly hyperactive type: Secondary | ICD-10-CM

## 2022-04-27 DIAGNOSIS — D509 Iron deficiency anemia, unspecified: Secondary | ICD-10-CM

## 2022-04-27 DIAGNOSIS — N1831 Chronic kidney disease, stage 3a: Secondary | ICD-10-CM

## 2022-04-27 DIAGNOSIS — F341 Dysthymic disorder: Secondary | ICD-10-CM

## 2022-04-27 DIAGNOSIS — I77810 Thoracic aortic ectasia: Secondary | ICD-10-CM

## 2022-04-27 DIAGNOSIS — E785 Hyperlipidemia, unspecified: Secondary | ICD-10-CM

## 2022-04-27 DIAGNOSIS — I10 Essential (primary) hypertension: Secondary | ICD-10-CM

## 2022-04-27 DIAGNOSIS — E669 Obesity, unspecified: Secondary | ICD-10-CM

## 2022-04-27 DIAGNOSIS — F5221 Male erectile disorder: Secondary | ICD-10-CM

## 2022-04-27 MED ORDER — TADALAFIL 20 MG PO TABS: 20.0000 mg | ORAL_TABLET | ORAL | 1 refills | Status: DC | PRN

## 2022-04-27 MED ORDER — WEGOVY 1 MG/0.5ML ~~LOC~~ SOAJ: 1.0000 mg | SUBCUTANEOUS | 0 refills | Status: DC

## 2022-04-27 MED ORDER — BUPROPION HCL ER (XL) 150 MG PO TB24: 150.0000 mg | ORAL_TABLET | Freq: Every morning | ORAL | 1 refills | Status: DC

## 2022-04-27 MED ORDER — ATENOLOL 25 MG PO TABS: 25.0000 mg | ORAL_TABLET | Freq: Every evening | ORAL | 1 refills | Status: DC

## 2022-04-27 MED ORDER — ICOSAPENT ETHYL 1 G PO CAPS: 2.0000 g | ORAL_CAPSULE | Freq: Two times a day (BID) | ORAL | 1 refills | Status: DC

## 2022-04-27 MED ORDER — ATOMOXETINE HCL 40 MG PO CAPS: 40.0000 mg | ORAL_CAPSULE | Freq: Every day | ORAL | 1 refills | Status: DC

## 2022-04-27 MED ORDER — HYDROXYZINE PAMOATE 25 MG PO CAPS: 25.0000 mg | ORAL_CAPSULE | Freq: Every day | ORAL | 1 refills | Status: DC

## 2022-06-28 ENCOUNTER — Encounter: Payer: 59 | Admitting: Family Medicine

## 2022-07-05 ENCOUNTER — Encounter: Payer: Self-pay | Admitting: Family Medicine

## 2022-07-08 ENCOUNTER — Other Ambulatory Visit: Payer: Self-pay | Admitting: Family Medicine

## 2022-07-08 MED ORDER — TIRZEPATIDE 5 MG/0.5ML ~~LOC~~ SOAJ: 5.0000 mg | SUBCUTANEOUS | 0 refills | Status: DC

## 2022-07-10 ENCOUNTER — Other Ambulatory Visit: Payer: Self-pay | Admitting: Family Medicine

## 2022-07-10 ENCOUNTER — Other Ambulatory Visit: Payer: Self-pay

## 2022-07-10 MED ORDER — TIRZEPATIDE 5 MG/0.5ML ~~LOC~~ SOAJ
5.0000 mg | SUBCUTANEOUS | 0 refills | Status: DC
  Filled 2022-07-10: qty 2, 28d supply, fill #0

## 2022-07-27 ENCOUNTER — Ambulatory Visit: Payer: 59 | Admitting: Family Medicine

## 2022-09-13 ENCOUNTER — Other Ambulatory Visit: Payer: Self-pay | Admitting: Family Medicine

## 2022-09-13 DIAGNOSIS — F419 Anxiety disorder, unspecified: Secondary | ICD-10-CM

## 2022-09-13 DIAGNOSIS — F341 Dysthymic disorder: Secondary | ICD-10-CM

## 2022-09-13 DIAGNOSIS — F901 Attention-deficit hyperactivity disorder, predominantly hyperactive type: Secondary | ICD-10-CM

## 2022-09-25 ENCOUNTER — Other Ambulatory Visit: Payer: Self-pay | Admitting: Family Medicine

## 2022-09-25 DIAGNOSIS — I10 Essential (primary) hypertension: Secondary | ICD-10-CM

## 2022-09-25 DIAGNOSIS — I471 Supraventricular tachycardia, unspecified: Secondary | ICD-10-CM

## 2022-10-08 ENCOUNTER — Encounter: Payer: Self-pay | Admitting: Family Medicine

## 2022-10-08 ENCOUNTER — Ambulatory Visit: Payer: 59 | Admitting: Family Medicine

## 2022-10-08 VITALS — BP 126/74 | HR 96 | Temp 97.9°F | Resp 16 | Ht 69.0 in | Wt 217.3 lb

## 2022-10-08 DIAGNOSIS — F341 Dysthymic disorder: Secondary | ICD-10-CM

## 2022-10-08 DIAGNOSIS — F901 Attention-deficit hyperactivity disorder, predominantly hyperactive type: Secondary | ICD-10-CM

## 2022-10-08 DIAGNOSIS — E669 Obesity, unspecified: Secondary | ICD-10-CM | POA: Diagnosis not present

## 2022-10-08 DIAGNOSIS — I10 Essential (primary) hypertension: Secondary | ICD-10-CM | POA: Diagnosis not present

## 2022-10-08 DIAGNOSIS — G4700 Insomnia, unspecified: Secondary | ICD-10-CM

## 2022-10-08 DIAGNOSIS — I471 Supraventricular tachycardia, unspecified: Secondary | ICD-10-CM

## 2022-10-08 DIAGNOSIS — F5221 Male erectile disorder: Secondary | ICD-10-CM

## 2022-10-08 MED ORDER — BUPROPION HCL ER (XL) 150 MG PO TB24: 150.0000 mg | ORAL_TABLET | Freq: Every morning | ORAL | 1 refills | Status: DC

## 2022-10-08 MED ORDER — TADALAFIL 20 MG PO TABS
20.0000 mg | ORAL_TABLET | ORAL | 1 refills | Status: DC | PRN
Start: 2022-10-08 — End: 2023-04-09

## 2022-10-08 MED ORDER — ATOMOXETINE HCL 40 MG PO CAPS: 40.0000 mg | ORAL_CAPSULE | Freq: Every day | ORAL | 1 refills | Status: DC

## 2022-10-08 MED ORDER — ATENOLOL 25 MG PO TABS: 25.0000 mg | ORAL_TABLET | Freq: Every evening | ORAL | 1 refills | Status: DC

## 2022-10-08 MED ORDER — HYDROXYZINE PAMOATE 25 MG PO CAPS: 25.0000 mg | ORAL_CAPSULE | Freq: Every day | ORAL | 1 refills | Status: DC

## 2022-10-08 MED ORDER — SEMAGLUTIDE-WEIGHT MANAGEMENT 1.7 MG/0.75ML ~~LOC~~ SOAJ
1.7000 mg | SUBCUTANEOUS | 0 refills | Status: DC
Start: 2023-01-03 — End: 2022-12-10

## 2022-10-08 MED ORDER — OLMESARTAN MEDOXOMIL 20 MG PO TABS: 20.0000 mg | ORAL_TABLET | Freq: Every day | ORAL | 1 refills | Status: DC

## 2022-10-08 NOTE — Progress Notes (Signed)
Name: Victor Valdez   MRN: [id number]    DOB: Sep 16, 1965   Date:10/08/2022       Progress Note  Subjective  Chief Complaint  Chief Complaint  Patient presents with   Follow-up    HPI  ADD: we stopped Adderal because of episodes of narrow complex tachycardia and syncope that happened Nov 2021. He is now on Strattera , initially felt emotional but it resolved quickly. The medication helps him with focus and denies side effects of medications at this time. He is doing well    CKI stage III: no pruritus, good urine output,  he needs to have repeat labs    GERD: he states since he started taking Nexium otc , symptoms resolved  Iron deficiency anemia: noticed on labs done last week, colonoscopy is up to date, we will give him 3 more hemoccult cards today and he needs to have repeat labs.    WU:JWJXBJ 10 mg is not working, we will try 20 mg dose and if it does not work we will refer him to urologist    Elevated liver enzymes/Alcoholism in remission: he stopped drinking during [facility] hospital stay back in Nov 2021, he had a short relapse in August 22  but has been off again since that time. . There is a family history of alcoholism - sister and father  He is still in remission    Obesity: he gained a lot of weight since he stopped drinking, we started him on Wegovy and he was  doing well, he was down 16 lbs, however due to [place] shortage he has been out of medication for the past 3 months. We will try to fill it at local pharmacy the 1 mg dose and after that he will go up to 1.7 mg    OSA: he started wearing his CPAP Spring 2024 and is feeling much better, waking up feeling rested. Snoring resolved    HTN:he is on beta-blocker and Benicar, his bp has been at goal. He denies chest pain, no more palpitation. Continue current regiment    Insomnia: he has tried  Melatonin,  Temazepam and Quiviqui. He is currently taking Hydroxyzine and sleeping well on CPAP    History of syncope and SVT: seeing  cardiologist, on Atenolol episodes of palpitations resolved    Anxiety/dysthymia : he was feeling  jittery and anxious during the Summer 22 and we started him on Lexapro. His wife noticed he was not his normal self, sleeping more than usual, lack of motivation , he went to see a psychiatrist in [place] and medication was switched from Lexapro to Wellbutrin and he is doing well. He is no longer seeing psychiatrist and doing well on current regiment    Mild dilation of ascending aorta: found on Echo done in 2021 at [facility],  last Echo done 08/2021 - and stable    Dyslipidemia: he does not want to take statins but now is taking Vascepa and will have labs done today    The 10-year ASCVD risk score (Arnett DK, et al., 2019) is: 10.6%   Values used to calculate the score:     Age: 89 years     Sex: Male     Is Non-Hispanic African American: No     Diabetic: No     Tobacco smoker: No     Systolic Blood Pressure: 126 mmHg     Is BP treated: Yes     HDL Cholesterol: 37 mg/dL     Total Cholesterol:  235 mg/dL   Patient Active Problem List   Diagnosis Date Noted   Iron deficiency anemia 12/26/2021   Gastroesophageal reflux disease without esophagitis 12/26/2021   Obesity (BMI 30.0-34.9) 12/26/2021   Snoring 12/26/2021   Stage 3a chronic kidney disease (HCC) 12/26/2021   Mild dilation of ascending aorta (HCC) 04/04/2020   Hepatic steatosis 04/04/2020   Low serum parathyroid hormone (PTH) 04/04/2020   Hypomagnesemia 04/04/2020   Hypercalcemia 04/04/2020   Narrow complex tachycardia 03/30/2020   Syncope 03/30/2020   Internal hemorrhoids 10/23/2016   Diverticulosis 10/23/2016   ADD (attention deficit disorder) 11/13/2014   Basal cell carcinoma of face 11/13/2014   CD (contact dermatitis) 11/13/2014   Dyslipidemia 11/13/2014   Blood glucose elevated 11/13/2014   Benign hypertension 11/13/2014   Perennial allergic rhinitis with seasonal variation 11/13/2014   Allergic rhinitis 11/13/2014     Past Surgical History:  Procedure Laterality Date   COLONOSCOPY WITH PROPOFOL N/A 10/08/2016   Procedure: COLONOSCOPY WITH PROPOFOL;  Surgeon: Christena Deem, MD;  Location[facility] ENDOSCOPY;  Service: Endoscopy;  Laterality: N/A;   EYE SURGERY Bilateral    lasik   LASIK Bilateral 12.30.2015    Family History  Problem Relation Age of Onset   Hypertension Mother    Cancer Father 5       prostate cancer   Hypertension Sister    Obesity Sister     Social History   Tobacco Use   Smoking status: Never   Smokeless tobacco: Former  Substance Use Topics   Alcohol use: Yes    Alcohol/week: 0.0 standard drinks of alcohol     Current Outpatient Medications:    atenolol (TENORMIN) 25 MG tablet, Take 1 tablet (25 mg total) by mouth every evening., Disp: 90 tablet, Rfl: 1   atomoxetine (STRATTERA) 40 MG capsule, Take 1 capsule (40 mg total) by mouth daily., Disp: 90 capsule, Rfl: 1   buPROPion (WELLBUTRIN XL) 150 MG 24 hr tablet, Take 1 tablet (150 mg total) by mouth every morning., Disp: 90 tablet, Rfl: 1   esomeprazole (NEXIUM) 20 MG capsule, Take 20 mg by mouth daily at 12 noon., Disp: , Rfl:    hydrOXYzine (VISTARIL) 25 MG capsule, Take 1 capsule (25 mg total) by mouth at bedtime., Disp: 90 capsule, Rfl: 1   icosapent Ethyl (VASCEPA) 1 g capsule, Take 2 capsules (2 g total) by mouth 2 (two) times daily., Disp: 360 capsule, Rfl: 1   olmesartan (BENICAR) 20 MG tablet, TAKE 1 TABLET BY MOUTH DAILY, Disp: 90 tablet, Rfl: 3   tadalafil (CIALIS) 20 MG tablet, Take 1 tablet (20 mg total) by mouth every other day as needed for erectile dysfunction., Disp: 30 tablet, Rfl: 1   tirzepatide (MOUNJARO) 5 MG/0.5ML Pen, Inject 5 mg into the skin once a week. (Patient not taking: Reported on 10/08/2022), Disp: 2 mL, Rfl: 0  Allergies  Allergen Reactions   Aspirin Anaphylaxis   Nsaids    Penicillins     I personally reviewed active problem list, medication list, allergies, family history,  social history, health maintenance with the patient/caregiver today.   ROS  Constitutional: Negative for fever, positive for  weight change.  Respiratory: Negative for cough and shortness of breath.   Cardiovascular: Negative for chest pain or palpitations.  Gastrointestinal: Negative for abdominal pain, no bowel changes.  Musculoskeletal: Negative for gait problem or joint swelling.  Skin: Negative for rash.  Neurological: Negative for dizziness or headache.  No other specific complaints in a complete  review of systems (except as listed in HPI above).   Objective  Vitals:   10/08/22 0802  BP: 126/74  Pulse: 96  Resp: 16  Temp: 97.9 F (36.6 C)  TempSrc: Oral  SpO2: 99%  Weight: 217 lb 4.8 oz (98.6 kg)  Height: 5\' 9"  (1.753 m)    Body mass index is 32.09 kg/m.  Physical Exam  Constitutional: Patient appears well-developed and well-nourished. Obese  No distress.  HEENT: head atraumatic, normocephalic, pupils equal and reactive to light, neck supple Cardiovascular: Normal rate, regular rhythm and normal heart sounds.  No murmur heard. No BLE edema. Pulmonary/Chest: Effort normal and breath sounds normal. No respiratory distress. Abdominal: Soft.  There is no tenderness. Psychiatric: Patient has a normal mood and affect. behavior is normal. Judgment and thought content normal.      PHQ2/9:    04/27/2022    7:51 AM 12/26/2021    8:18 AM 10/19/2021    8:43 AM 07/26/2021   10:21 AM 04/05/2021   11:00 AM  Depression screen PHQ 2/9  Decreased Interest 0 0 0 0 0  Down, Depressed, Hopeless 0 0 0 0 0  PHQ - 2 Score 0 0 0 0 0  Altered sleeping 0 0 3 0 0  Tired, decreased energy 0 0 0 2 0  Change in appetite 0 0 0 0 0  Feeling bad or failure about yourself  0 0 0 0 0  Trouble concentrating 0 3 0 0 0  Moving slowly or fidgety/restless 0 0 0 0 0  Suicidal thoughts 0 0 0 0 0  PHQ-9 Score 0 3 3 2  0    phq 9 is negative   Fall Risk:    10/08/2022    8:02 AM 04/27/2022     7:51 AM 12/26/2021    8:18 AM 10/19/2021    8:43 AM 07/26/2021   10:21 AM  Fall Risk   Falls in the past year? 0 0 0 0 0  Number falls in past yr:  0 0 0 0  Injury with Fall?  0 0 0 0  Risk for fall due to : No Fall Risks No Fall Risks No Fall Risks No Fall Risks No Fall Risks  Follow up Falls prevention discussed Falls prevention discussed Falls prevention discussed Falls prevention discussed Falls prevention discussed     Functional Status Survey: Is the patient deaf or have difficulty hearing?: No Does the patient have difficulty seeing, even when wearing glasses/contacts?: No Does the patient have difficulty concentrating, remembering, or making decisions?: No Does the patient have difficulty walking or climbing stairs?: No Does the patient have difficulty dressing or bathing?: No Does the patient have difficulty doing errands alone such as visiting a doctor's office or shopping?: No    Assessment & Plan  1. Benign hypertension  - atenolol (TENORMIN) 25 MG tablet; Take 1 tablet (25 mg total) by mouth every evening.  Dispense: 90 tablet; Refill: 1 - olmesartan (BENICAR) 20 MG tablet; Take 1 tablet (20 mg total) by mouth daily.  Dispense: 90 tablet; Refill: 1  2. SVT (supraventricular tachycardia)  - atenolol (TENORMIN) 25 MG tablet; Take 1 tablet (25 mg total) by mouth every evening.  Dispense: 90 tablet; Refill: 1  3. Attention deficit hyperactivity disorder (ADHD), predominantly hyperactive type  - atomoxetine (STRATTERA) 40 MG capsule; Take 1 capsule (40 mg total) by mouth daily.  Dispense: 90 capsule; Refill: 1  4. Dysthymia  - buPROPion (WELLBUTRIN XL) 150 MG 24 hr tablet;  Take 1 tablet (150 mg total) by mouth every morning.  Dispense: 90 tablet; Refill: 1  5. Insomnia, unspecified type  - hydrOXYzine (VISTARIL) 25 MG capsule; Take 1 capsule (25 mg total) by mouth at bedtime.  Dispense: 90 capsule; Refill: 1  6. Obesity (BMI 30.0-34.9)  - Semaglutide-Weight  Management 1.7 MG/0.75ML SOAJ; Inject 1.7 mg into the skin once a week.  Dispense: 9 mL; Refill: 0  7. ED (erectile dysfunction) of non-organic origin  - tadalafil (CIALIS) 20 MG tablet; Take 1 tablet (20 mg total) by mouth every other day as needed for erectile dysfunction.  Dispense: 30 tablet; Refill: 1

## 2022-12-10 ENCOUNTER — Other Ambulatory Visit: Payer: Self-pay

## 2022-12-10 DIAGNOSIS — E669 Obesity, unspecified: Secondary | ICD-10-CM

## 2022-12-10 MED ORDER — SEMAGLUTIDE-WEIGHT MANAGEMENT 1.7 MG/0.75ML ~~LOC~~ SOAJ
1.7000 mg | SUBCUTANEOUS | 0 refills | Status: DC
Start: 2023-01-03 — End: 2023-01-08
  Filled 2022-12-10: qty 3, 28d supply, fill #0

## 2022-12-19 ENCOUNTER — Other Ambulatory Visit: Payer: Self-pay | Admitting: Neurology

## 2022-12-19 DIAGNOSIS — R55 Syncope and collapse: Secondary | ICD-10-CM

## 2022-12-24 ENCOUNTER — Telehealth: Payer: Self-pay

## 2022-12-24 NOTE — Progress Notes (Unsigned)
Name: Victor Valdez   MRN: [id number]    DOB: [birth year]   Date:12/25/2022       Progress Note  Subjective  Chief Complaint  Hospital Follow-Up  HPI  Admitted: 12/21/22 Discharged: 12/23/22  Patient had a relapse on drinking alcohol early June, while on a trip with his family in July - [place] - he developed some tremors, severe fatigue, diaphoresis, confusion followed by jerky  movements that seemed to be a seizure . He went to [facility] had IV fluids and left AMA so they could board on the cruise ship. He still felt very tired for most of the trip and spent a lot of time in his cabin. They came home and while helping their daughter move in to her dorms at [facility] he felt the same way. He was taken to the local hospital and found to have hypercalcemia - value  of 15.3 and ionized calcium of 7.53. Parathyroid level was normal . He was given IV fluids, calcitonin and zolendronic acid on 08/17.   During his stay he alsohad elevation of creatinine   He was sent home on Zio patch due to history of SVT   HTN: sent home on his normal medications Atenolol and Olmasartan but bp is high today. He has a bp monitor at home and will monitor, added hydralazine to take prn if bp above 150/90   He was seen by neurologist, Dr. Sherryll Burger and advised to stop Wellbutrin due to possible seizure. He will have sleep deprived EEG, he also ordered another MRI - wife is concerned about his emotional changes and neuro changes over the past year   He has not been drinking alcohol since July   Normal SPEP and UPEP done at [facility]  We will recheck labs and if still abnormal refer to nephrologist for further evaluation of electrolyte abnormalities   Patient Active Problem List   Diagnosis Date Noted   Iron deficiency anemia 12/26/2021   Gastroesophageal reflux disease without esophagitis 12/26/2021   Obesity (BMI 30.0-34.9) 12/26/2021   Snoring 12/26/2021   Stage 3a chronic kidney disease (HCC) 12/26/2021   Mild dilation  of ascending aorta (HCC) 04/04/2020   Hepatic steatosis 04/04/2020   Low serum parathyroid hormone (PTH) 04/04/2020   Hypomagnesemia 04/04/2020   Hypercalcemia 04/04/2020   Narrow complex tachycardia 03/30/2020   Syncope 03/30/2020   Internal hemorrhoids 10/23/2016   Diverticulosis 10/23/2016   ADD (attention deficit disorder) 11/13/2014   Basal cell carcinoma of face 11/13/2014   CD (contact dermatitis) 11/13/2014   Dyslipidemia 11/13/2014   Blood glucose elevated 11/13/2014   Benign hypertension 11/13/2014   Perennial allergic rhinitis with seasonal variation 11/13/2014   Allergic rhinitis 11/13/2014    Past Surgical History:  Procedure Laterality Date   COLONOSCOPY WITH PROPOFOL N/A 10/08/2016   Procedure: COLONOSCOPY WITH PROPOFOL;  Surgeon: Christena Deem, MD;  Location: [facility] ENDOSCOPY;  Service: Endoscopy;  Laterality: N/A;   EYE SURGERY Bilateral    lasik   LASIK Bilateral 12.30.2015    Family History  Problem Relation Age of Onset   Hypertension Mother    Cancer Father 74       prostate cancer   Hypertension Sister    Obesity Sister     Social History   Tobacco Use   Smoking status: Never   Smokeless tobacco: Former  Substance Use Topics   Alcohol use: Yes    Alcohol/week: 0.0 standard drinks of alcohol     Current Outpatient Medications:  atenolol (TENORMIN) 25 MG tablet, Take 1 tablet (25 mg total) by mouth every evening., Disp: 90 tablet, Rfl: 1   atomoxetine (STRATTERA) 40 MG capsule, Take 1 capsule (40 mg total) by mouth daily., Disp: 90 capsule, Rfl: 1   esomeprazole (NEXIUM) 20 MG capsule, Take 20 mg by mouth daily at 12 noon., Disp: , Rfl:    hydrOXYzine (VISTARIL) 25 MG capsule, Take 1 capsule (25 mg total) by mouth at bedtime., Disp: 90 capsule, Rfl: 1   icosapent Ethyl (VASCEPA) 1 g capsule, Take 2 capsules (2 g total) by mouth 2 (two) times daily., Disp: 360 capsule, Rfl: 1   olmesartan (BENICAR) 20 MG tablet, Take 1 tablet (20 mg total)  by mouth daily., Disp: 90 tablet, Rfl: 1   [START ON 01/03/2023] Semaglutide-Weight Management 1.7 MG/0.75ML SOAJ, Inject 1.7 mg into the skin once a week., Disp: 9 mL, Rfl: 0   tadalafil (CIALIS) 20 MG tablet, Take 1 tablet (20 mg total) by mouth every other day as needed for erectile dysfunction., Disp: 30 tablet, Rfl: 1   traZODone (DESYREL) 50 MG tablet, Take 75 mg by mouth at bedtime., Disp: , Rfl:    hydrALAZINE (APRESOLINE) 10 MG tablet, Take 1-2 tablets (10-20 mg total) by mouth 4 (four) times daily as needed. If BP above 150/90, Disp: 60 tablet, Rfl: 0  Allergies  Allergen Reactions   Aspirin Anaphylaxis   Nsaids    Penicillins     I personally reviewed active problem list, medication list, allergies, family history, social history, health maintenance with the patient/caregiver today.   ROS  Ten systems reviewed and is negative except as mentioned in HPI    Objective  Vitals:   12/25/22 1118  BP: (!) 160/83  Pulse: 87  Resp: 14  Temp: (!) 97.5 F (36.4 C)  TempSrc: Oral  SpO2: 99%  Weight: 210 lb 1.6 oz (95.3 kg)  Height: 5\' 10"  (1.778 m)    Body mass index is 30.15 kg/m.  Physical Exam   Constitutional: Patient appears well-developed and well-nourished. Obese  No distress.  HEENT: head atraumatic, normocephalic, pupils equal and reactive to light, neck supple Cardiovascular: Normal rate, regular rhythm and normal heart sounds.  No murmur heard. No BLE edema. Pulmonary/Chest: Effort normal and breath sounds normal. No respiratory distress. Abdominal: Soft.  There is no tenderness. Psychiatric: Patient has a normal mood and affect. behavior is normal.    PHQ2/9:    12/25/2022   11:21 AM 10/08/2022    8:36 AM 04/27/2022    7:51 AM 12/26/2021    8:18 AM 10/19/2021    8:43 AM  Depression screen PHQ 2/9  Decreased Interest 0 0 0 0 0  Down, Depressed, Hopeless 0 0 0 0 0  PHQ - 2 Score 0 0 0 0 0  Altered sleeping 0 0 0 0 3  Tired, decreased energy 0 0 0 0 0   Change in appetite 0 1 0 0 0  Feeling bad or failure about yourself  0 0 0 0 0  Trouble concentrating 1 0 0 3 0  Moving slowly or fidgety/restless 0 0 0 0 0  Suicidal thoughts 0 0 0 0 0  PHQ-9 Score 1 1 0 3 3  Difficult doing work/chores Not difficult at all Not difficult at all       phq 9 is negative   Fall Risk:    12/25/2022   11:20 AM 10/08/2022    8:02 AM 04/27/2022    7:51 AM  12/26/2021    8:18 AM 10/19/2021    8:43 AM  Fall Risk   Falls in the past year? 1 0 0 0 0  Number falls in past yr: 1  0 0 0  Injury with Fall? 0  0 0 0  Risk for fall due to : History of fall(s) No Fall Risks No Fall Risks No Fall Risks No Fall Risks  Follow up Falls evaluation completed;Falls prevention discussed;Education provided Falls prevention discussed Falls prevention discussed Falls prevention discussed Falls prevention discussed     Assessment & Plan  1. Hospital discharge follow-up  - Basic Metabolic Panel (BMET)  2. Hypercalcemia  - BASIC METABOLIC PANEL WITH GFR - Calcium, ionized - PTH, intact and calcium - Basic Metabolic Panel (BMET)  3. History of alcoholism (HCC)  - Basic Metabolic Panel (BMET)  4. Uncontrolled hypertension  - hydrALAZINE (APRESOLINE) 10 MG tablet; Take 1-2 tablets (10-20 mg total) by mouth 4 (four) times daily as needed. If BP above 150/90  Dispense: 60 tablet; Refill: 0

## 2022-12-24 NOTE — Transitions of Care (Post Inpatient/ED Visit) (Unsigned)
   12/24/2022  Name: Victor Valdez MRN: [id number] DOB: [birth year]  Today's TOC FU Call Status: Today's TOC FU Call Status:: Unsuccessful Call (1st Attempt) Unsuccessful Call (1st Attempt) Date: 12/24/22  Attempted to reach the patient regarding the most recent Inpatient/ED visit.  Follow Up Plan: Additional outreach attempts will be made to reach the patient to complete the Transitions of Care (Post Inpatient/ED visit) call.   Signature Karena Addison, LPN [facility] Nurse Health Advisor Direct Dial [phone]

## 2022-12-25 ENCOUNTER — Ambulatory Visit: Payer: 59 | Admitting: Family Medicine

## 2022-12-25 ENCOUNTER — Encounter: Payer: Self-pay | Admitting: Family Medicine

## 2022-12-25 VITALS — BP 154/80 | HR 87 | Temp 97.5°F | Resp 14 | Ht 70.0 in | Wt 210.1 lb

## 2022-12-25 DIAGNOSIS — I10 Essential (primary) hypertension: Secondary | ICD-10-CM | POA: Diagnosis not present

## 2022-12-25 DIAGNOSIS — Z09 Encounter for follow-up examination after completed treatment for conditions other than malignant neoplasm: Secondary | ICD-10-CM | POA: Diagnosis not present

## 2022-12-25 DIAGNOSIS — F1021 Alcohol dependence, in remission: Secondary | ICD-10-CM

## 2022-12-25 MED ORDER — HYDRALAZINE HCL 10 MG PO TABS
10.0000 mg | ORAL_TABLET | Freq: Four times a day (QID) | ORAL | 0 refills | Status: DC | PRN
Start: 2022-12-25 — End: 2023-04-09

## 2022-12-25 MED ORDER — HYDRALAZINE HCL 10 MG PO TABS
10.0000 mg | ORAL_TABLET | Freq: Four times a day (QID) | ORAL | 0 refills | Status: DC | PRN
Start: 2022-12-25 — End: 2022-12-25

## 2022-12-25 NOTE — Transitions of Care (Post Inpatient/ED Visit) (Signed)
   12/25/2022  Name: Victor Valdez MRN: [id number] DOB: 1965-10-14  Today's TOC FU Call Status: Today's TOC FU Call Status:: Unsuccessful Call (1st Attempt) Unsuccessful Call (1st Attempt) Date: 12/24/22  Attempted to reach the patient regarding the most recent Inpatient/ED visit.  Follow Up Plan: No further outreach attempts will be made at this time. We have been unable to contact the patient. Patient already seen in office Signature  Karena Addison, LPN [facility] Nurse Health Advisor Direct Dial [phone]

## 2022-12-27 LAB — BASIC METABOLIC PANEL
BUN/Creatinine Ratio: 16 (ref 9–20)
BUN: 20 mg/dL (ref 6–24)
CO2: 23 mmol/L (ref 20–29)
Calcium: 8.3 mg/dL — ABNORMAL LOW (ref 8.7–10.2)
Chloride: 99 mmol/L (ref 96–106)
Creatinine, Ser: 1.26 mg/dL (ref 0.76–1.27)
Glucose: 95 mg/dL (ref 70–99)
Potassium: 4 mmol/L (ref 3.5–5.2)
Sodium: 135 mmol/L (ref 134–144)
eGFR: 67 mL/min/{1.73_m2} (ref >59)

## 2022-12-27 LAB — PTH, INTACT AND CALCIUM: PTH: 85 pg/mL — ABNORMAL HIGH (ref 15–65)

## 2022-12-27 LAB — CALCIUM, IONIZED: Calcium, Ion: 4.5 mg/dL (ref 4.5–5.6)

## 2022-12-28 ENCOUNTER — Other Ambulatory Visit: Payer: Self-pay | Admitting: Family Medicine

## 2022-12-28 DIAGNOSIS — R7989 Other specified abnormal findings of blood chemistry: Secondary | ICD-10-CM

## 2022-12-31 NOTE — Addendum Note (Signed)
Addended by: Alba Cory F on: 12/31/2022 12:26 PM   Modules accepted: Level of Service

## 2023-01-04 ENCOUNTER — Other Ambulatory Visit: Payer: Self-pay | Admitting: Family Medicine

## 2023-01-04 DIAGNOSIS — I10 Essential (primary) hypertension: Secondary | ICD-10-CM

## 2023-01-04 NOTE — Progress Notes (Unsigned)
Name: Victor Valdez   MRN: [id number]    DOB: [birth year]   Date:01/08/2023       Progress Note  Subjective  Chief Complaint  Follow up  He came in with his wife today, advised by Dr. Sherryll Burger not to drive since Syncopal episode in July while in [place]  HPI  ADD: we stopped Adderal because of episodes of narrow complex tachycardia and syncope that happened Nov 2021. He is now on Strattera , initially felt emotional but it resolved quickly. The medication helps him with focus and denies side effects of medications at this time. He needs a refill today    GERD: he was taking Nexium, however due to hypercalcemia he was advised by [facility] providers to switch to H2blockers, he states symptoms are controlled, he avoids spicy food , we will send rx for Pepcid to [facility]    Elevated liver enzymes/Alcoholism in remission: he stopped drinking during [facility] hospital stay back in Nov 2021, he had a short relapse in August 22  and again in early 2024, but has been alcohol free since July 3 rd 2024  . There is a family history of alcoholism - sister and father     Obesity: he gained a lot of weight since he stopped drinking, he is on Wegovy , currently 1.7 mg dose, he states it curbs his appetite, we will adjust dose today to 2.4 mg. Weight in June was 217 lbs and today is down to 212 lbs    OSA: he started wearing his CPAP Spring 2024 and is feeling much better, waking up feeling rested. Unchanged    HTN:he is on beta-blocker and Benicar, he has not been checking his bp at home, however it has been above goal for a while, we will adjust dose of Olmasartan to 40 mg, he has hydralazine to take prn also. Monitor for orthostatic changes    Insomnia: he has tried  Melatonin,  Temazepam and Quiviqui. He is currently taking Trazodone given while at [facility] for near syncope  Hypercalcemia: episodes of syncope in [place] mid July and in August felt fatigued and tired and wife called EMS , she felt it was same symptoms prior to syncope  in [place]. He has seen neurologist - had a zio patch placed ( results pending) ,he is going to have an EGG. He saw endo last week and had positive Kappa free light chains, calcium was up again at 10.5 . He needs further evaluation    History of syncope and SVT: seeing cardiologist, on Atenolol and episodes of palpitation improved, however had two events while wearing Zio patch recently, results pending    Anxiety/dysthymia : he was feeling  jittery and anxious during the Summer 22 and we started him on Lexapro. His wife noticed he was not his normal self, sleeping more than usual, lack of motivation , he went to see a psychiatrist in [place] and medication was switched from Lexapro to Wellbutrin and he was doing well, wellbutrin has been discontinued after hospital stay in August due to decrease in seizure threshold. He is doing well today    Mild dilation of ascending aorta: found on Echo done in 2021 at [facility],  last Echo done 08/2021 - and stable    Dyslipidemia: he does not want to take statins , he has not been taking Vascepa, we will try sending lovaza to mail order today    Patient Active Problem List   Diagnosis Date Noted   Iron deficiency anemia 12/26/2021  Gastroesophageal reflux disease without esophagitis 12/26/2021   Obesity (BMI 30.0-34.9) 12/26/2021   Snoring 12/26/2021   Stage 3a chronic kidney disease (HCC) 12/26/2021   Mild dilation of ascending aorta (HCC) 04/04/2020   Hepatic steatosis 04/04/2020   Low serum parathyroid hormone (PTH) 04/04/2020   Hypomagnesemia 04/04/2020   Hypercalcemia 04/04/2020   Narrow complex tachycardia 03/30/2020   Syncope 03/30/2020   Internal hemorrhoids 10/23/2016   Diverticulosis 10/23/2016   ADD (attention deficit disorder) 11/13/2014   Basal cell carcinoma of face 11/13/2014   CD (contact dermatitis) 11/13/2014   Dyslipidemia 11/13/2014   Blood glucose elevated 11/13/2014   Benign hypertension 11/13/2014   Perennial allergic rhinitis  with seasonal variation 11/13/2014   Allergic rhinitis 11/13/2014    Past Surgical History:  Procedure Laterality Date   COLONOSCOPY WITH PROPOFOL N/A 10/08/2016   Procedure: COLONOSCOPY WITH PROPOFOL;  Surgeon: Christena Deem, MD;  Location: [facility] ENDOSCOPY;  Service: Endoscopy;  Laterality: N/A;   EYE SURGERY Bilateral    lasik   LASIK Bilateral 12.30.2015    Family History  Problem Relation Age of Onset   Hypertension Mother    Cancer Father 29       prostate cancer   Hypertension Sister    Obesity Sister     Social History   Tobacco Use   Smoking status: Never   Smokeless tobacco: Former  Substance Use Topics   Alcohol use: Yes    Alcohol/week: 0.0 standard drinks of alcohol     Current Outpatient Medications:    atenolol (TENORMIN) 25 MG tablet, Take 1 tablet (25 mg total) by mouth every evening., Disp: 90 tablet, Rfl: 1   atomoxetine (STRATTERA) 40 MG capsule, Take 1 capsule (40 mg total) by mouth daily., Disp: 90 capsule, Rfl: 1   cetirizine (ZYRTEC) 10 MG tablet, Take 10 mg by mouth daily., Disp: , Rfl:    famotidine (PEPCID) 40 MG tablet, Take 1 tablet (40 mg total) by mouth daily. In place of Zantac otc, Disp: 90 tablet, Rfl: 2   hydrALAZINE (APRESOLINE) 10 MG tablet, Take 1-2 tablets (10-20 mg total) by mouth 4 (four) times daily as needed. If BP above 150/90, Disp: 60 tablet, Rfl: 0   Semaglutide-Weight Management (WEGOVY) 2.4 MG/0.75ML SOAJ, Inject 2.4 mg into the skin once a week., Disp: 9 mL, Rfl: 0   tadalafil (CIALIS) 20 MG tablet, Take 1 tablet (20 mg total) by mouth every other day as needed for erectile dysfunction., Disp: 30 tablet, Rfl: 1   olmesartan (BENICAR) 40 MG tablet, Take 1 tablet (40 mg total) by mouth daily., Disp: 90 tablet, Rfl: 0   traZODone (DESYREL) 100 MG tablet, Take 1 tablet (100 mg total) by mouth at bedtime., Disp: 90 tablet, Rfl: 2  Allergies  Allergen Reactions   Aspirin Anaphylaxis   Nsaids    Penicillins     I  personally reviewed active problem list, medication list, allergies, family history, social history with the patient/caregiver today.   ROS  Ten systems reviewed and is negative except as mentioned in HPI     Objective  Vitals:   01/08/23 0847  BP: (!) 148/80  Pulse: 96  Temp: 97.7 F (36.5 C)  SpO2: 96%  Weight: 212 lb 4.8 oz (96.3 kg)    Body mass index is 30.46 kg/m.  Physical Exam  Constitutional: Patient appears well-developed and well-nourished. Obese  No distress.  HEENT: head atraumatic, normocephalic, pupils equal and reactive to light, neck supple Cardiovascular: Normal rate, regular rhythm  and normal heart sounds.  No murmur heard. No BLE edema. Pulmonary/Chest: Effort normal and breath sounds normal. No respiratory distress. Abdominal: Soft.  There is no tenderness. Psychiatric: Patient has a normal mood and affect. behavior is normal. Judgment and thought content normal.   Recent Results (from the past 2160 hour(s))  Calcium, ionized     Status: None   Collection Time: 12/25/22  1:03 PM  Result Value Ref Range   Calcium, Ion 4.5 4.5 - 5.6 mg/dL  PTH, intact and calcium     Status: Abnormal   Collection Time: 12/25/22  1:03 PM  Result Value Ref Range   PTH 85 (H) 15 - 65 pg/mL   PTH Interp Comment     Comment: Interpretation                 Intact PTH    Calcium                                 (pg/mL)      (mg/dL) Normal                          15 - 65     8.6 - 10.2 Primary Hyperparathyroidism         >65          >10.2 Secondary Hyperparathyroidism       >65          <10.2 Non-Parathyroid Hypercalcemia       <65          >10.2 Hypoparathyroidism                  <15          < 8.6 Non-Parathyroid Hypocalcemia    15 - 65          < 8.6   Basic Metabolic Panel (BMET)     Status: Abnormal   Collection Time: 12/25/22  1:03 PM  Result Value Ref Range   Glucose 95 70 - 99 mg/dL   BUN 20 6 - 24 mg/dL   Creatinine, Ser 8.11 0.76 - 1.27 mg/dL   eGFR 67  >91 YN/WGN/5.62   BUN/Creatinine Ratio 16 9 - 20   Sodium 135 134 - 144 mmol/L   Potassium 4.0 3.5 - 5.2 mmol/L   Chloride 99 96 - 106 mmol/L   CO2 23 20 - 29 mmol/L   Calcium 8.3 (L) 8.7 - 10.2 mg/dL      ZHY8/6:    09/11/8467    8:51 AM 12/25/2022   11:21 AM 10/08/2022    8:36 AM 04/27/2022    7:51 AM 12/26/2021    8:18 AM  Depression screen PHQ 2/9  Decreased Interest 0 0 0 0 0  Down, Depressed, Hopeless 0 0 0 0 0  PHQ - 2 Score 0 0 0 0 0  Altered sleeping 0 0 0 0 0  Tired, decreased energy 0 0 0 0 0  Change in appetite 0 0 1 0 0  Feeling bad or failure about yourself  0 0 0 0 0  Trouble concentrating 0 1 0 0 3  Moving slowly or fidgety/restless 0 0 0 0 0  Suicidal thoughts 0 0 0 0 0  PHQ-9 Score 0 1 1 0 3  Difficult doing work/chores Not difficult at all Not difficult at all Not difficult at all  phq 9 is negative   Fall Risk:    01/08/2023    8:50 AM 12/25/2022   11:20 AM 10/08/2022    8:02 AM 04/27/2022    7:51 AM 12/26/2021    8:18 AM  Fall Risk   Falls in the past year? 1 1 0 0 0  Number falls in past yr: 1 1  0 0  Injury with Fall? 0 0  0 0  Risk for fall due to :  History of fall(s) No Fall Risks No Fall Risks No Fall Risks  Follow up  Falls evaluation completed;Falls prevention discussed;Education provided Falls prevention discussed Falls prevention discussed Falls prevention discussed     Assessment & Plan  1. Benign hypertension  - olmesartan (BENICAR) 40 MG tablet; Take 1 tablet (40 mg total) by mouth daily.  Dispense: 90 tablet; Refill: 0  2. Obesity (BMI 30.0-34.9)  - Semaglutide-Weight Management (WEGOVY) 2.4 MG/0.75ML SOAJ; Inject 2.4 mg into the skin once a week.  Dispense: 9 mL; Refill: 0  3. History of alcoholism (HCC)  Doing well now   4. Calcium metabolism disorder  Seeing Endo   5. Hypercalcemia  He was seen by Endo   6. Gastroesophageal reflux disease without esophagitis  - famotidine (PEPCID) 40 MG tablet; Take 1 tablet  (40 mg total) by mouth daily. In place of Zantac otc  Dispense: 90 tablet; Refill: 2  7. Primary insomnia  - traZODone (DESYREL) 100 MG tablet; Take 1 tablet (100 mg total) by mouth at bedtime.  Dispense: 90 tablet; Refill: 2   8. Mild dilation of ascending aorta (HCC)  Does not want to take statins   9. Dyslipidemia  - omega-3 acid ethyl esters (LOVAZA) 1 g capsule; Take 2 capsules (2 g total) by mouth 2 (two) times daily.  Dispense: 360 capsule; Refill: 1

## 2023-01-08 ENCOUNTER — Encounter: Payer: Self-pay | Admitting: Family Medicine

## 2023-01-08 ENCOUNTER — Ambulatory Visit: Payer: 59 | Admitting: Family Medicine

## 2023-01-08 ENCOUNTER — Other Ambulatory Visit: Payer: Self-pay

## 2023-01-08 VITALS — BP 148/80 | HR 96 | Temp 97.7°F | Ht 70.0 in | Wt 212.3 lb

## 2023-01-08 DIAGNOSIS — I10 Essential (primary) hypertension: Secondary | ICD-10-CM | POA: Diagnosis not present

## 2023-01-08 DIAGNOSIS — F1021 Alcohol dependence, in remission: Secondary | ICD-10-CM | POA: Diagnosis not present

## 2023-01-08 DIAGNOSIS — E669 Obesity, unspecified: Secondary | ICD-10-CM

## 2023-01-08 DIAGNOSIS — K219 Gastro-esophageal reflux disease without esophagitis: Secondary | ICD-10-CM

## 2023-01-08 DIAGNOSIS — I77810 Thoracic aortic ectasia: Secondary | ICD-10-CM

## 2023-01-08 DIAGNOSIS — F5101 Primary insomnia: Secondary | ICD-10-CM

## 2023-01-08 DIAGNOSIS — E785 Hyperlipidemia, unspecified: Secondary | ICD-10-CM

## 2023-01-08 MED ORDER — OLMESARTAN MEDOXOMIL 40 MG PO TABS
40.0000 mg | ORAL_TABLET | Freq: Every day | ORAL | 0 refills | Status: DC
Start: 2023-01-08 — End: 2023-04-09

## 2023-01-08 MED ORDER — TRAZODONE HCL 100 MG PO TABS
100.0000 mg | ORAL_TABLET | Freq: Every day | ORAL | 2 refills | Status: DC
Start: 2023-01-08 — End: 2023-12-04

## 2023-01-08 MED ORDER — WEGOVY 2.4 MG/0.75ML ~~LOC~~ SOAJ
2.4000 mg | SUBCUTANEOUS | 0 refills | Status: DC
  Filled 2023-01-08: qty 3, 28d supply, fill #0
  Filled 2023-02-06: qty 3, 28d supply, fill #1
  Filled 2023-03-08: qty 3, 28d supply, fill #2

## 2023-01-08 MED ORDER — FAMOTIDINE 40 MG PO TABS
40.0000 mg | ORAL_TABLET | Freq: Every day | ORAL | 2 refills | Status: DC
Start: 2023-01-08 — End: 2023-06-13

## 2023-01-08 MED ORDER — OMEGA-3-ACID ETHYL ESTERS 1 G PO CAPS
2.0000 g | ORAL_CAPSULE | Freq: Two times a day (BID) | ORAL | 1 refills | Status: DC
Start: 2023-01-08 — End: 2023-07-05

## 2023-01-09 DIAGNOSIS — R769 Abnormal immunological finding in serum, unspecified: Secondary | ICD-10-CM | POA: Insufficient documentation

## 2023-01-14 ENCOUNTER — Ambulatory Visit
Admission: RE | Admit: 2023-01-14 | Discharge: 2023-01-14 | Disposition: A | Discharge status: Home / Self Care | Payer: 59 | Source: Ambulatory Visit | Attending: Neurology | Admitting: Neurology

## 2023-01-14 DIAGNOSIS — R55 Syncope and collapse: Secondary | ICD-10-CM

## 2023-01-18 ENCOUNTER — Inpatient Hospital Stay: Payer: 59 | Attending: Oncology | Admitting: Oncology

## 2023-01-18 ENCOUNTER — Inpatient Hospital Stay: Payer: 59

## 2023-01-18 ENCOUNTER — Encounter: Payer: Self-pay | Admitting: Oncology

## 2023-01-18 VITALS — BP 130/99 | HR 85 | Temp 97.9°F | Resp 16 | Wt 212.0 lb

## 2023-01-18 DIAGNOSIS — Z87891 Personal history of nicotine dependence: Secondary | ICD-10-CM | POA: Diagnosis not present

## 2023-01-18 DIAGNOSIS — R769 Abnormal immunological finding in serum, unspecified: Secondary | ICD-10-CM | POA: Diagnosis present

## 2023-01-18 NOTE — Assessment & Plan Note (Signed)
Labs are reviewed and discussed with patient. Slightly increases kappa light chain,  I will repeat myeloma panel, light chain  and 24 hour UPEP Check skeletal survey l

## 2023-01-18 NOTE — Progress Notes (Signed)
New pt referred for abnormal immunological findings.  Pt BP is elevated and pt is aware and taking it at home and has  BP parameters for hydrazaline.

## 2023-01-18 NOTE — Progress Notes (Signed)
Hematology/Oncology Consult note [facility] Telephone:([phone] [phone]   Patient Care Team: Alba Cory, MD as PCP - General (Family Medicine) Debbe Odea, MD as PCP - Cardiology (Cardiology) Deirdre Evener, MD as Consulting Physician (Dermatology) Rickard Patience, MD as Consulting Physician (Oncology)  REFERRING PROVIDER: Simon Rhein, MD  CHIEF COMPLAINTS/REASON FOR VISIT:  Evaluation of abnormal SPEP/UPEP  ASSESSMENT & PLAN:   Abnormal serum immunoelectrophoresis Labs are reviewed and discussed with patient. Slightly increases kappa light chain,  I will repeat myeloma panel, light chain  and 24 hour UPEP Check skeletal survey l  Hypercalcemia PTH is appropriately suppressed, likely hypercalcemia is not due to primary hyperparathyroidism.  Other etiologies include myeloma,  vitamin D toxicity, sarcoidosis, bone metastatic disease from solid tumor, dehydration, TB, etc.  Check PSA, angiotensin converting enzyme   Orders Placed This Encounter  Procedures   DG Bone Survey Met    Standing Status:   Future    Standing Expiration Date:   03/20/2023    Order Specific Question:   Reason for exam:    Answer:   abnormal serum immunoelectrophoresis    Order Specific Question:   Preferred imaging location?    Answer:   [facility]   [facility] Rx was provided to patient.  Follow up in 3 -4 weeks.   All questions were answered. The patient knows to call the clinic with any problems, questions or concerns.  Rickard Patience, MD, PhD [facility] Hematology Oncology 01/18/2023    HISTORY OF PRESENTING ILLNESS:  Victor Valdez is a 57 y.o. male who was seen in consultation at the request of Simon Rhein, MD for evaluation of abnormal SPEP results.   Patient sees endocrinology for hypercalcemia.  12/21/22 hospitalization at [facility] due to syncope and found to have severe hypercalcemia with AKI , PTH was low at 8.1, 1,25 OH Vit D  <8, He reports a similar episode with hospitalization at [facility] in 2021 with calcium of 12.1 with PTH of 10 hypercalcemia was attributed to dehydration and to HCTZ   12/24/2022 SPEP showed a slight irregularity in the gamma region, which may represent monoclonal protein  24 hour UPEP showed mild selective glomerular proteinuria with excretion of small amounts of albumin and trace amounts of alpha and beta globulins. The urine protein pattern demonstrates the presence of a faint suspicious band of restricted mobility which may be indicative of a monoclonal protein  Free kappa light chain 25.1, lamda light chain 26.2, ratio 0.96  He denies history of abnormal bone pain or bone fracture. Denies chills, night sweats, anorexia or abnormal weight loss.  He presents for further evaluation of abnormal SPEP and UPEP.  Denies weight loss, fever, chills,night sweats.  Not currently taking Vitamin D, calcium, lithium or hydrochlorothiazide.  He was on weight loss medication.  He used to drink alcohol, quitted now.  He has ongoing work up for seizure. Family history: father with prostate cancer, grandparents with cancer, no details known.  Last colonoscopy in 2018  MEDICAL HISTORY:  Past Medical History:  Diagnosis Date   Adult attention deficit disorder    Allergy    Basal cell carcinoma    R shoulder per pt    Basal cell epithelioma, face    Dr. Enid Cutter, R eye   Dysplastic nevus 04/20/2019   Left superior buttocks/posterior waistline. Mild to moderate atypia, margins free.   Hyperglycemia    Hyperlipidemia    Hypertension    Snoring  SURGICAL HISTORY: Past Surgical History:  Procedure Laterality Date   COLONOSCOPY WITH PROPOFOL N/A 10/08/2016   Procedure: COLONOSCOPY WITH PROPOFOL;  Surgeon: Christena Deem, MD;  Location: [facility];  Service: Endoscopy;  Laterality: N/A;   EYE SURGERY Bilateral    lasik   LASIK Bilateral 12.30.2015    SOCIAL HISTORY: Social History    Socioeconomic History   Marital status: Married    Spouse name: Nicholos Johns   Number of children: 2   Years of education: Not on file   Highest education level: Master's degree (e.g., MA, MS, MEng, MEd, MSW, MBA)  Occupational History   Not on file  Tobacco Use   Smoking status: Never   Smokeless tobacco: Former  Building services engineer status: Never Used  Substance and Sexual Activity   Alcohol use: Yes    Alcohol/week: 0.0 standard drinks of alcohol   Drug use: No   Sexual activity: Yes    Partners: Female    Birth control/protection: None  Other Topics Concern   Not on file  Social History Narrative   Not on file   Social Determinants of Health   Financial Resource Strain: Low Risk  (01/02/2023)   Received from [facility]   Overall Financial Resource Strain (CARDIA)    Difficulty of Paying Living Expenses: Not hard at all  Food Insecurity: No Food Insecurity (01/18/2023)   Hunger Vital Sign    Worried About Running Out of Food in the Last Year: Never true    Ran Out of Food in the Last Year: Never true  Transportation Needs: No Transportation Needs (01/18/2023)   PRAPARE - Administrator, Civil Service (Medical): No    Lack of Transportation (Non-Medical): No  Physical Activity: Sufficiently Active (06/13/2020)   Exercise Vital Sign    Days of Exercise per Week: 7 days    Minutes of Exercise per Session: 50 min  Stress: No Stress Concern Present (06/13/2020)   [employer] of Occupational Health - Occupational Stress Questionnaire    Feeling of Stress : Only a little  Social Connections: Socially Integrated (06/13/2020)   Social Connection and Isolation Panel [NHANES]    Frequency of Communication with Friends and Family: More than three times a week    Frequency of Social Gatherings with Friends and Family: More than three times a week    Attends Religious Services: More than 4 times per year    Active Member of [employer] or Organizations: Yes     Attends Engineer, structural: More than 4 times per year    Marital Status: Married  Catering manager Violence: Not At Risk (01/18/2023)   Humiliation, Afraid, Rape, and Kick questionnaire    Fear of Current or Ex-Partner: No    Emotionally Abused: No    Physically Abused: No    Sexually Abused: No    FAMILY HISTORY: Family History  Problem Relation Age of Onset   Hypertension Mother    Cancer Father 46       prostate cancer   Hypertension Sister    Obesity Sister     ALLERGIES:  is allergic to aspirin, nsaids, and penicillins.  MEDICATIONS:  Current Outpatient Medications  Medication Sig Dispense Refill   atenolol (TENORMIN) 25 MG tablet Take 1 tablet (25 mg total) by mouth every evening. 90 tablet 1   atomoxetine (STRATTERA) 40 MG capsule Take 1 capsule (40 mg total) by mouth daily. 90 capsule 1   cetirizine (ZYRTEC)  10 MG tablet Take 10 mg by mouth daily.     famotidine (PEPCID) 40 MG tablet Take 1 tablet (40 mg total) by mouth daily. In place of Zantac otc 90 tablet 2   hydrALAZINE (APRESOLINE) 10 MG tablet Take 1-2 tablets (10-20 mg total) by mouth 4 (four) times daily as needed. If BP above 150/90 60 tablet 0   olmesartan (BENICAR) 40 MG tablet Take 1 tablet (40 mg total) by mouth daily. 90 tablet 0   omega-3 acid ethyl esters (LOVAZA) 1 g capsule Take 2 capsules (2 g total) by mouth 2 (two) times daily. 360 capsule 1   Semaglutide-Weight Management (WEGOVY) 2.4 MG/0.75ML SOAJ Inject 2.4 mg into the skin once a week. 9 mL 0   tadalafil (CIALIS) 20 MG tablet Take 1 tablet (20 mg total) by mouth every other day as needed for erectile dysfunction. 30 tablet 1   traZODone (DESYREL) 100 MG tablet Take 1 tablet (100 mg total) by mouth at bedtime. 90 tablet 2   No current facility-administered medications for this visit.    Review of Systems  Constitutional:  Positive for fatigue. Negative for appetite change, chills, fever and unexpected weight change.  HENT:    Negative for hearing loss and voice change.   Eyes:  Negative for eye problems and icterus.  Respiratory:  Negative for chest tightness, cough and shortness of breath.   Cardiovascular:  Negative for chest pain and leg swelling.  Gastrointestinal:  Negative for abdominal distention and abdominal pain.  Endocrine: Negative for hot flashes.  Genitourinary:  Negative for difficulty urinating, dysuria and frequency.   Musculoskeletal:  Negative for arthralgias.  Skin:  Negative for itching and rash.  Neurological:  Negative for light-headedness and numbness.  Hematological:  Negative for adenopathy. Does not bruise/bleed easily.  Psychiatric/Behavioral:  Positive for depression. Negative for confusion.    PHYSICAL EXAMINATION: ECOG PERFORMANCE STATUS: 1 - Symptomatic but completely ambulatory Vitals:   01/18/23 0944  BP: (!) 130/99  Pulse: 85  Resp: 16  Temp: 97.9 F (36.6 C)  SpO2: 99%   Filed Weights   01/18/23 0944  Weight: 212 lb (96.2 kg)    Physical Exam Constitutional:      General: He is not in acute distress. HENT:     Head: Normocephalic and atraumatic.  Eyes:     General: No scleral icterus. Cardiovascular:     Rate and Rhythm: Normal rate and regular rhythm.     Heart sounds: Normal heart sounds.  Pulmonary:     Effort: Pulmonary effort is normal. No respiratory distress.     Breath sounds: No wheezing.  Abdominal:     General: Bowel sounds are normal. There is no distension.     Palpations: Abdomen is soft.  Musculoskeletal:        General: No deformity. Normal range of motion.     Cervical back: Normal range of motion and neck supple.  Skin:    General: Skin is warm and dry.     Findings: No erythema or rash.  Neurological:     Mental Status: He is alert and oriented to person, place, and time. Mental status is at baseline.     Cranial Nerves: No cranial nerve deficit.     Coordination: Coordination normal.  Psychiatric:        Mood and Affect: Mood  normal.      LABORATORY DATA:  I have reviewed the data as listed    Latest Ref Rng & Units  12/21/2021    2:54 PM 07/15/2018    7:59 AM 01/16/2017    3:33 PM  CBC  WBC 3.4 - 10.8 x10E3/uL 6.1  4.9  6.7   Hemoglobin 13.0 - 17.7 g/dL 40.9  81.1  91.4   Hematocrit 37.5 - 51.0 % 35.2  41.7  39.5   Platelets 150 - 450 x10E3/uL 238  184  274       Latest Ref Rng & Units 12/25/2022    1:03 PM 12/21/2021    2:54 PM 12/23/2020    8:52 AM  CMP  Glucose 70 - 99 mg/dL 95  782  97   BUN 6 - 24 mg/dL 20  20  27    Creatinine 0.76 - 1.27 mg/dL 9.56  2.13  0.86   Sodium 134 - 144 mmol/L 135  140  141   Potassium 3.5 - 5.2 mmol/L 4.0  4.7  3.9   Chloride 96 - 106 mmol/L 99  102  100   CO2 20 - 29 mmol/L 23  23  27    Calcium 8.7 - 10.2 mg/dL 8.3  9.8  57.8   Total Protein 6.0 - 8.5 g/dL  6.5  6.8   Total Bilirubin 0.0 - 1.2 mg/dL  0.3  0.6   Alkaline Phos 44 - 121 IU/L  78  100   AST 0 - 40 IU/L  19  177   ALT 0 - 44 IU/L  16  258             RADIOGRAPHIC STUDIES: I have personally reviewed the radiological images as listed and agreed with the findings in the report.  No results found.

## 2023-01-18 NOTE — Assessment & Plan Note (Signed)
PTH is appropriately suppressed, likely hypercalcemia is not due to primary hyperparathyroidism.  Other etiologies include myeloma,  vitamin D toxicity, sarcoidosis, bone metastatic disease from solid tumor, dehydration, TB, etc.  Check PSA, angiotensin converting enzyme

## 2023-01-29 ENCOUNTER — Encounter: Payer: Self-pay | Admitting: Oncology

## 2023-02-04 ENCOUNTER — Ambulatory Visit
Admission: RE | Admit: 2023-02-04 | Discharge: 2023-02-04 | Disposition: A | Discharge status: Home / Self Care | Payer: 59 | Source: Ambulatory Visit | Attending: Oncology | Admitting: Oncology

## 2023-02-04 DIAGNOSIS — R769 Abnormal immunological finding in serum, unspecified: Secondary | ICD-10-CM

## 2023-02-08 ENCOUNTER — Encounter: Payer: Self-pay | Admitting: Oncology

## 2023-02-08 ENCOUNTER — Inpatient Hospital Stay: Payer: 59 | Attending: Oncology | Admitting: Oncology

## 2023-02-08 VITALS — BP 159/114 | HR 78 | Temp 97.0°F | Resp 18 | Wt 211.3 lb

## 2023-02-08 DIAGNOSIS — D649 Anemia, unspecified: Secondary | ICD-10-CM | POA: Diagnosis not present

## 2023-02-08 DIAGNOSIS — R769 Abnormal immunological finding in serum, unspecified: Secondary | ICD-10-CM | POA: Insufficient documentation

## 2023-02-08 DIAGNOSIS — Z79899 Other long term (current) drug therapy: Secondary | ICD-10-CM | POA: Insufficient documentation

## 2023-02-08 NOTE — Assessment & Plan Note (Addendum)
Lab results from [facility] are reviewed and discussed with patient. Slightly increases kappa light chain, normal light chain ratio, negative M protein on SPEP, negative immunofixation. 24-hour urine protein electrophoresis negative M protein and immunofixation Skeletal survey showed no lytic lesions or osseous lesions. no evidence to suggest active multiple myeloma Not explaining his intermittent hypercalcemia.

## 2023-02-08 NOTE — Assessment & Plan Note (Addendum)
PTH is appropriately suppressed, likely hypercalcemia is not due to primary hyperparathyroidism.  Normal PSA, normal angiotensin-converting enzyme.  Normal PTH RP Hypercalcemia-unknown etiology.  He has normal calcium level on recent blood work.  Recommend patient continue follow-up with endocrinology

## 2023-02-08 NOTE — Progress Notes (Signed)
Hematology/Oncology Consult note [facility] [phone] [phone]   Patient Care Team: Alba Cory, MD as PCP - General (Family Medicine) Debbe Odea, MD as PCP - Cardiology (Cardiology) Deirdre Evener, MD as Consulting Physician (Dermatology) Rickard Patience, MD as Consulting Physician (Oncology)  REFERRING PROVIDER: Alba Cory, MD  CHIEF COMPLAINTS/REASON FOR VISIT:  Evaluation of abnormal SPEP/UPEP  ASSESSMENT & PLAN:   Abnormal serum immunoelectrophoresis Lab results from [facility] are reviewed and discussed with patient. Slightly increases kappa light chain, normal light chain ratio, negative M protein on SPEP, negative immunofixation. 24-hour urine protein electrophoresis negative M protein and immunofixation Skeletal survey showed no lytic lesions or osseous lesions. no evidence to suggest active multiple myeloma Not explaining his intermittent hypercalcemia.   Hypercalcemia PTH is appropriately suppressed, likely hypercalcemia is not due to primary hyperparathyroidism.  Normal PSA, normal angiotensin-converting enzyme.  Normal PTH RP Hypercalcemia-unknown etiology.  He has normal calcium level on recent blood work.  Recommend patient continue follow-up with endocrinology   Normocytic anemia Hemoglobin has trended down to 10.2.  MCV 82. Went to check iron TIBC ferritin.  He has a history of iron deficiency anemia  [facility] prescription provided to patient.  Follow-up as needed.  All questions were answered. The patient knows to call the clinic with any problems, questions or concerns.  Rickard Patience, MD, PhD [facility] Hematology Oncology 02/08/2023    HISTORY OF PRESENTING ILLNESS:  Victor Valdez is a 57 y.o. male who was seen in consultation at the request of Alba Cory, MD for evaluation of abnormal SPEP results.   Patient sees endocrinology for hypercalcemia.  12/21/22 hospitalization at [facility] due to  syncope and found to have severe hypercalcemia with AKI , PTH was low at 8.1, 1,25 OH Vit D <8, He reports a similar episode with hospitalization at [facility] in 2021 with calcium of 12.1 with PTH of 10 hypercalcemia was attributed to dehydration and to HCTZ   12/24/2022 SPEP showed a slight irregularity in the gamma region, which may represent monoclonal protein  24 hour UPEP showed mild selective glomerular proteinuria with excretion of small amounts of albumin and trace amounts of alpha and beta globulins. The urine protein pattern demonstrates the presence of a faint suspicious band of restricted mobility which may be indicative of a monoclonal protein  Free kappa light chain 25.1, lamda light chain 26.2, ratio 0.96  He denies history of abnormal bone pain or bone fracture. Denies chills, night sweats, anorexia or abnormal weight loss.  He presents for further evaluation of abnormal SPEP and UPEP.  Denies weight loss, fever, chills,night sweats.  Not currently taking Vitamin D, calcium, lithium or hydrochlorothiazide.  He was on weight loss medication.  He used to drink alcohol, quitted now.  He has ongoing work up for seizure. Family history: father with prostate cancer, grandparents with cancer, no details known.  Last colonoscopy in 2018   INTERVAL HISTORY Victor Valdez is a 57 y.o. male who has above history reviewed by me today presents for follow up visit for discussion of lab workup results.  He has no new concerns.   MEDICAL HISTORY:  Past Medical History:  Diagnosis Date   Adult attention deficit disorder    Allergy    Basal cell carcinoma    R shoulder per pt    Basal cell epithelioma, face    Dr. Enid Cutter, R eye   Dysplastic nevus 04/20/2019   Left superior buttocks/posterior waistline. Mild to moderate atypia, margins free.  Hyperglycemia    Hyperlipidemia    Hypertension    Snoring     SURGICAL HISTORY: Past Surgical History:  Procedure Laterality Date    COLONOSCOPY WITH PROPOFOL N/A 10/08/2016   Procedure: COLONOSCOPY WITH PROPOFOL;  Surgeon: Christena Deem, MD;  Location[facility] ENDOSCOPY;  Service: Endoscopy;  Laterality: N/A;   EYE SURGERY Bilateral    lasik   LASIK Bilateral 12.30.2015    SOCIAL HISTORY: Social History   Socioeconomic History   Marital status: Married    Spouse name: Nicholos Johns   Number of children: 2   Years of education: Not on file   Highest education level: Master's degree (e.g., MA, MS, MEng, MEd, MSW, MBA)  Occupational History   Not on file  Tobacco Use   Smoking status: Never   Smokeless tobacco: Former  Building services engineer status: Never Used  Substance and Sexual Activity   Alcohol use: Yes    Alcohol/week: 0.0 standard drinks of alcohol   Drug use: No   Sexual activity: Yes    Partners: Female    Birth control/protection: None  Other Topics Concern   Not on file  Social History Narrative   Not on file   Social Determinants of Health   Financial Resource Strain: Low Risk  (01/02/2023)   Received from [facility]   Overall Financial Resource Strain (CARDIA)    Difficulty of Paying Living Expenses: Not hard at all  Food Insecurity: No Food Insecurity (01/18/2023)   Hunger Vital Sign    Worried About Running Out of Food in the Last Year: Never true    Ran Out of Food in the Last Year: Never true  Transportation Needs: No Transportation Needs (01/18/2023)   PRAPARE - Administrator, Civil Service (Medical): No    Lack of Transportation (Non-Medical): No  Physical Activity: Sufficiently Active (06/13/2020)   Exercise Vital Sign    Days of Exercise per Week: 7 days    Minutes of Exercise per Session: 50 min  Stress: No Stress Concern Present (06/13/2020)   [employer] of Occupational Health - Occupational Stress Questionnaire    Feeling of Stress : Only a little  Social Connections: Socially Integrated (06/13/2020)   Social Connection and Isolation Panel  [NHANES]    Frequency of Communication with Friends and Family: More than three times a week    Frequency of Social Gatherings with Friends and Family: More than three times a week    Attends Religious Services: More than 4 times per year    Active Member of [employer] or Organizations: Yes    Attends Engineer, structural: More than 4 times per year    Marital Status: Married  Catering manager Violence: Not At Risk (01/18/2023)   Humiliation, Afraid, Rape, and Kick questionnaire    Fear of Current or Ex-Partner: No    Emotionally Abused: No    Physically Abused: No    Sexually Abused: No    FAMILY HISTORY: Family History  Problem Relation Age of Onset   Hypertension Mother    Cancer Father 72       prostate cancer   Hypertension Sister    Obesity Sister     ALLERGIES:  is allergic to aspirin, nsaids, and penicillins.  MEDICATIONS:  Current Outpatient Medications  Medication Sig Dispense Refill   atenolol (TENORMIN) 25 MG tablet Take 1 tablet (25 mg total) by mouth every evening. 90 tablet 1   atomoxetine (STRATTERA) 40 MG  capsule Take 1 capsule (40 mg total) by mouth daily. 90 capsule 1   cetirizine (ZYRTEC) 10 MG tablet Take 10 mg by mouth daily.     famotidine (PEPCID) 40 MG tablet Take 1 tablet (40 mg total) by mouth daily. In place of Zantac otc 90 tablet 2   hydrALAZINE (APRESOLINE) 10 MG tablet Take 1-2 tablets (10-20 mg total) by mouth 4 (four) times daily as needed. If BP above 150/90 60 tablet 0   olmesartan (BENICAR) 40 MG tablet Take 1 tablet (40 mg total) by mouth daily. 90 tablet 0   omega-3 acid ethyl esters (LOVAZA) 1 g capsule Take 2 capsules (2 g total) by mouth 2 (two) times daily. 360 capsule 1   Semaglutide-Weight Management (WEGOVY) 2.4 MG/0.75ML SOAJ Inject 2.4 mg into the skin once a week. 9 mL 0   tadalafil (CIALIS) 20 MG tablet Take 1 tablet (20 mg total) by mouth every other day as needed for erectile dysfunction. 30 tablet 1   traZODone (DESYREL)  100 MG tablet Take 1 tablet (100 mg total) by mouth at bedtime. 90 tablet 2   No current facility-administered medications for this visit.    Review of Systems  Constitutional:  Positive for fatigue. Negative for appetite change, chills, fever and unexpected weight change.  HENT:   Negative for hearing loss and voice change.   Eyes:  Negative for eye problems and icterus.  Respiratory:  Negative for chest tightness, cough and shortness of breath.   Cardiovascular:  Negative for chest pain and leg swelling.  Gastrointestinal:  Negative for abdominal distention and abdominal pain.  Endocrine: Negative for hot flashes.  Genitourinary:  Negative for difficulty urinating, dysuria and frequency.   Musculoskeletal:  Negative for arthralgias.  Skin:  Negative for itching and rash.  Neurological:  Negative for light-headedness and numbness.  Hematological:  Negative for adenopathy. Does not bruise/bleed easily.  Psychiatric/Behavioral:  Positive for depression. Negative for confusion.    PHYSICAL EXAMINATION: ECOG PERFORMANCE STATUS: 1 - Symptomatic but completely ambulatory Vitals:   02/08/23 1027 02/08/23 1037  BP: (!) 169/106 (!) 159/114  Pulse: 78   Resp: 18   Temp: (!) 97 F (36.1 C)   SpO2: 100%    Filed Weights   02/08/23 1027  Weight: 211 lb 4.8 oz (95.8 kg)    Physical Exam Constitutional:      General: He is not in acute distress. HENT:     Head: Normocephalic and atraumatic.  Eyes:     General: No scleral icterus. Cardiovascular:     Rate and Rhythm: Normal rate and regular rhythm.     Heart sounds: Normal heart sounds.  Pulmonary:     Effort: Pulmonary effort is normal. No respiratory distress.     Breath sounds: No wheezing.  Abdominal:     General: Bowel sounds are normal. There is no distension.     Palpations: Abdomen is soft.  Musculoskeletal:        General: No deformity. Normal range of motion.     Cervical back: Normal range of motion and neck supple.   Skin:    General: Skin is warm and dry.     Findings: No erythema or rash.  Neurological:     Mental Status: He is alert and oriented to person, place, and time. Mental status is at baseline.     Cranial Nerves: No cranial nerve deficit.     Coordination: Coordination normal.  Psychiatric:  Mood and Affect: Mood normal.      LABORATORY DATA:  I have reviewed the data as listed    Latest Ref Rng & Units 12/21/2021    2:54 PM 07/15/2018    7:59 AM 01/16/2017    3:33 PM  CBC  WBC 3.4 - 10.8 x10E3/uL 6.1  4.9  6.7   Hemoglobin 13.0 - 17.7 g/dL 16.1  09.6  04.5   Hematocrit 37.5 - 51.0 % 35.2  41.7  39.5   Platelets 150 - 450 x10E3/uL 238  184  274       Latest Ref Rng & Units 12/25/2022    1:03 PM 12/21/2021    2:54 PM 12/23/2020    8:52 AM  CMP  Glucose 70 - 99 mg/dL 95  409  97   BUN 6 - 24 mg/dL 20  20  27    Creatinine 0.76 - 1.27 mg/dL 8.11  9.14  7.82   Sodium 134 - 144 mmol/L 135  140  141   Potassium 3.5 - 5.2 mmol/L 4.0  4.7  3.9   Chloride 96 - 106 mmol/L 99  102  100   CO2 20 - 29 mmol/L 23  23  27    Calcium 8.7 - 10.2 mg/dL 8.3  9.8  95.6   Total Protein 6.0 - 8.5 g/dL  6.5  6.8   Total Bilirubin 0.0 - 1.2 mg/dL  0.3  0.6   Alkaline Phos 44 - 121 IU/L  78  100   AST 0 - 40 IU/L  19  177   ALT 0 - 44 IU/L  16  258             RADIOGRAPHIC STUDIES: I have personally reviewed the radiological images as listed and agreed with the findings in the report.  DG Bone Survey Met  Result Date: 02/07/2023 CLINICAL DATA:  57 year old male with abnormal serum electrophoresis. EXAM: METASTATIC BONE SURVEY COMPARISON:  Brain MRI 01/14/2023. Cervical spine radiographs 12/23/2019. FINDINGS: PA view of the chest. Lung volumes and mediastinal contours within normal limits, azygous lobe and fissure normal variant. Ventilation within normal limits, no suspicious pulmonary opacity. Visible abdomen and pelvis, nonobstructed bowel gas pattern. Visceral contours within normal  limits. Visible neck. Calcified cervical carotid artery atherosclerosis greater on the left. Lateral skull bone mineralization appears normal. Cervical spine bone mineralization is normal. Normal prevertebral soft tissue contour. Chronic disc and endplate degeneration C5-C6 and C6-C7. Moderate overall and some progression since 2021. Cervicothoracic junction alignment is within normal limits. Thoracic spine segmentation and mineralization within normal limits. Lumbar spine segmentation and mineralization within normal limits. Maintained vertebral height. Degenerative appearing mild retrolisthesis L2 on L3. Evidence of some chronic disc and endplate degeneration there. Lower lumbar facet hypertrophy is up to moderate. Pelvis bone mineralization within normal limits. SI joints appear symmetric and normal. Rib bone mineralization within normal limits. No rib fracture identified. Visible bilateral upper and lower extremity bone mineralization within normal limits. Asymmetric mild to moderate left AC joint degeneration with spurring. Chronic appearing right scaphoid fracture, ununited. Associated right radiocarpal joint degeneration with joint space loss and osteophytosis. Benign-appearing dystrophic calcification in the medial left thigh, 5 cm in length medial to the femoral shaft. No chronic lower extremity fracture is identified. No lytic or destructive osseous lesion. IMPRESSION: 1. Normal bone mineralization for age. No lytic or destructive osseous lesion identified. 2. Cervical carotid calcified atherosclerosis greater on the left. 3. Chronic ununited right scaphoid fracture, superimposed right radiocarpal joint degeneration.  4. Chronic lower cervical spine disc and endplate degeneration. Degenerative appearing mild spondylolisthesis in the lumbar spine at L2-L3. Asymmetric left acromioclavicular joint degeneration. 5. Benign appearing, possibly posttraumatic soft tissue calcification of the medial left thigh.  Electronically Signed   By: Odessa Fleming M.D.   On: 02/07/2023 16:13   MR BRAIN WO CONTRAST  Result Date: 01/29/2023 CLINICAL DATA:  Syncope. Seizure. Confusion and difficulty walking. EXAM: MRI HEAD WITHOUT CONTRAST TECHNIQUE: Multiplanar, multiecho pulse sequences of the brain and surrounding structures were obtained without intravenous contrast. COMPARISON:  Head MRI 08/12/2020 FINDINGS: Brain: There is no evidence of an acute infarct, intracranial hemorrhage, mass, midline shift, or extra-axial fluid collection. Punctate T2 hyperintensities in the cerebral white matter have either progressed or are better shown on today's study and are nonspecific but compatible with minimal chronic small vessel ischemic disease. Cerebral atrophy is unchanged. A small focus of faint susceptibility is again noted in the pons corresponding to a capillary telangiectasia on the prior study. Vascular: Major intracranial vascular flow voids are preserved. Skull and upper cervical spine: Unremarkable bone marrow signal. Sinuses/Orbits: Unremarkable orbits. Mild mucosal thickening in the paranasal sinuses. Possible small nasal polyps as previously questioned. Trace bilateral mastoid fluid. Other: None. IMPRESSION: 1. No acute intracranial abnormality. 2. Minimal chronic small vessel ischemic disease. Electronically Signed   By: Sebastian Ache M.D.   On: 01/29/2023 19:56

## 2023-02-08 NOTE — Assessment & Plan Note (Signed)
Hemoglobin has trended down to 10.2.  MCV 82. Went to check iron TIBC ferritin.  He has a history of iron deficiency anemia

## 2023-02-18 ENCOUNTER — Encounter: Payer: Self-pay | Admitting: Oncology

## 2023-02-19 ENCOUNTER — Other Ambulatory Visit: Payer: Self-pay | Admitting: Family Medicine

## 2023-02-19 DIAGNOSIS — I10 Essential (primary) hypertension: Secondary | ICD-10-CM

## 2023-02-19 DIAGNOSIS — I471 Supraventricular tachycardia, unspecified: Secondary | ICD-10-CM

## 2023-02-22 ENCOUNTER — Telehealth: Payer: Self-pay | Admitting: *Deleted

## 2023-02-22 NOTE — Telephone Encounter (Signed)
I called patient back and advised that he take over the counter Vitron C 1 tablet per day and that he will be referred to GI for a work up and asked if he has a preference He said he has seen a Gi 2 years ago at [facility], but that he does not have a preference and we can refer him to whomever.

## 2023-02-22 NOTE — Telephone Encounter (Signed)
Patient called reporting that he was told to call after his results came back if he had not heard from Dr Cathie Hoops. His lab results are scanned in and he has not heard from our office. Please return his call

## 2023-02-25 ENCOUNTER — Other Ambulatory Visit: Payer: Self-pay

## 2023-02-25 DIAGNOSIS — D509 Iron deficiency anemia, unspecified: Secondary | ICD-10-CM

## 2023-02-25 NOTE — Telephone Encounter (Signed)
GI referral placed

## 2023-03-05 ENCOUNTER — Other Ambulatory Visit: Payer: Self-pay | Admitting: Family Medicine

## 2023-03-05 DIAGNOSIS — I10 Essential (primary) hypertension: Secondary | ICD-10-CM

## 2023-03-06 ENCOUNTER — Telehealth: Payer: Self-pay

## 2023-03-06 NOTE — Telephone Encounter (Signed)
Please schedule MD appt in 4 months. I will mail it along with [facility] order form.

## 2023-03-06 NOTE — Telephone Encounter (Signed)
-----   Message from Rickard Patience sent at 03/05/2023  9:27 PM EDT ----- Please arrange patient to follow up in 4 months MD with repeat [facility] lab cbc iron tibc ferritin prior to MD

## 2023-03-23 ENCOUNTER — Encounter: Payer: Self-pay | Admitting: Family Medicine

## 2023-03-25 ENCOUNTER — Other Ambulatory Visit: Payer: Self-pay | Admitting: Family Medicine

## 2023-03-25 DIAGNOSIS — D509 Iron deficiency anemia, unspecified: Secondary | ICD-10-CM

## 2023-03-27 ENCOUNTER — Ambulatory Visit: Payer: 59 | Admitting: Dermatology

## 2023-03-27 ENCOUNTER — Encounter: Payer: Self-pay | Admitting: Dermatology

## 2023-03-27 DIAGNOSIS — D229 Melanocytic nevi, unspecified: Secondary | ICD-10-CM

## 2023-03-27 DIAGNOSIS — W908XXA Exposure to other nonionizing radiation, initial encounter: Secondary | ICD-10-CM

## 2023-03-27 DIAGNOSIS — Z1283 Encounter for screening for malignant neoplasm of skin: Secondary | ICD-10-CM | POA: Diagnosis not present

## 2023-03-27 DIAGNOSIS — L729 Follicular cyst of the skin and subcutaneous tissue, unspecified: Secondary | ICD-10-CM

## 2023-03-27 DIAGNOSIS — L720 Epidermal cyst: Secondary | ICD-10-CM

## 2023-03-27 DIAGNOSIS — L738 Other specified follicular disorders: Secondary | ICD-10-CM

## 2023-03-27 DIAGNOSIS — L814 Other melanin hyperpigmentation: Secondary | ICD-10-CM | POA: Diagnosis not present

## 2023-03-27 DIAGNOSIS — L578 Other skin changes due to chronic exposure to nonionizing radiation: Secondary | ICD-10-CM | POA: Diagnosis not present

## 2023-03-27 DIAGNOSIS — Z85828 Personal history of other malignant neoplasm of skin: Secondary | ICD-10-CM

## 2023-03-27 DIAGNOSIS — L709 Acne, unspecified: Secondary | ICD-10-CM

## 2023-03-27 DIAGNOSIS — D2272 Melanocytic nevi of left lower limb, including hip: Secondary | ICD-10-CM

## 2023-03-27 DIAGNOSIS — L821 Other seborrheic keratosis: Secondary | ICD-10-CM

## 2023-03-27 DIAGNOSIS — Z86018 Personal history of other benign neoplasm: Secondary | ICD-10-CM

## 2023-03-27 DIAGNOSIS — L719 Rosacea, unspecified: Secondary | ICD-10-CM

## 2023-03-27 DIAGNOSIS — L700 Acne vulgaris: Secondary | ICD-10-CM

## 2023-03-27 DIAGNOSIS — D1801 Hemangioma of skin and subcutaneous tissue: Secondary | ICD-10-CM

## 2023-03-27 MED ORDER — METRONIDAZOLE 0.75 % EX GEL: CUTANEOUS | 5 refills | Status: DC

## 2023-03-27 MED ORDER — DOXYCYCLINE 40 MG PO CPDR: DELAYED_RELEASE_CAPSULE | ORAL | 5 refills | Status: DC

## 2023-03-27 MED ORDER — CLINDAMYCIN PHOSPHATE 1 % EX FOAM: CUTANEOUS | 5 refills | Status: DC

## 2023-03-27 NOTE — Progress Notes (Signed)
Follow-Up Visit   Subjective  Victor Valdez is a 57 y.o. male who presents for the following: Skin Cancer Screening and Full Body Skin Exam.  Pt has h/o acne/rosacea.  The patient presents for Total-Body Skin Exam (TBSE) for skin cancer screening and mole check. The patient has spots, moles and lesions to be evaluated, some may be new or changing and the patient may have concern these could be cancer.    The following portions of the chart were reviewed this encounter and updated as appropriate: medications, allergies, medical history  Review of Systems:  No other skin or systemic complaints except as noted in HPI or Assessment and Plan.  Objective  Well appearing patient in no apparent distress; mood and affect are within normal limits.  A full examination was performed including scalp, head, eyes, ears, nose, lips, neck, chest, axillae, abdomen, back, buttocks, bilateral upper extremities, bilateral lower extremities, hands, feet, fingers, toes, fingernails, and toenails. All findings within normal limits unless otherwise noted below.   Relevant physical exam findings are noted in the Assessment and Plan.    Assessment & Plan   SKIN CANCER SCREENING PERFORMED TODAY.  ACTINIC DAMAGE - Chronic condition, secondary to cumulative UV/sun exposure - diffuse scaly erythematous macules with underlying dyspigmentation - Recommend daily broad spectrum sunscreen SPF 30+ to sun-exposed areas, reapply every 2 hours as needed.  - Staying in the shade or wearing long sleeves, sun glasses (UVA+UVB protection) and wide brim hats (4-inch brim around the entire circumference of the hat) are also recommended for sun protection.  - Call for new or changing lesions.  LENTIGINES, SEBORRHEIC KERATOSES, HEMANGIOMAS - Benign normal skin lesions - Benign-appearing - Call for any changes  MELANOCYTIC NEVI - Tan-brown and/or pink-flesh-colored symmetric macules and papules - 2 mm brown macule left  medial post thigh - Benign appearing on exam today - Observation - Call clinic for new or changing moles - Recommend daily use of broad spectrum spf 30+ sunscreen to sun-exposed areas.   HISTORY OF BASAL CELL CARCINOMA OF THE SKIN Right lateral lower eyelid, 2016, Mohs Right temple, 2013, exc - No evidence of recurrence today - Recommend regular full body skin exams - Recommend daily broad spectrum sunscreen SPF 30+ to sun-exposed areas, reapply every 2 hours as needed.  - Call if any new or changing lesions are noted between office visits  History of Dysplastic Nevi - No evidence of recurrence today - Recommend regular full body skin exams - Recommend daily broad spectrum sunscreen SPF 30+ to sun-exposed areas, reapply every 2 hours as needed.  - Call if any new or changing lesions are noted between office visits  SEBORRHEIC KERATOSIS, biopsy proven 02/16/2020 - Tan patch with central clearing at right upper eyelid near margin - Benign-appearing - Discussed benign etiology and prognosis. - Observe - Call for any changes  Sebaceous Hyperplasia - Small yellow papules with a central dell face - Benign-appearing - Observe. Call for changes.  EPIDERMAL INCLUSION CYST Exam: Subcutaneous nodule at left posterior neck  Benign-appearing. Exam most consistent with an epidermal inclusion cyst. Discussed that a cyst is a benign growth that can grow over time and sometimes get irritated or inflamed. Recommend observation if it is not bothersome. Discussed option of surgical excision to remove it if it is growing, symptomatic, or other changes noted. Please call for new or changing lesions so they can be evaluated.   ROSACEA/ACNE Exam Inflammatory papules on the chin with erythema and telangiectasias; inflammatory papules of  the posterior neck, chest; follicular papules at occipital scalp.  Chronic and persistent condition with duration or expected duration over one year. Condition is  bothersome/symptomatic for patient. Currently flared.   Rosacea is a chronic progressive skin condition usually affecting the face of adults, causing redness and/or acne bumps. It is treatable but not curable. It sometimes affects the eyes (ocular rosacea) as well. It may respond to topical and/or systemic medication and can flare with stress, sun exposure, alcohol, exercise, topical steroids (including hydrocortisone/cortisone 10) and some foods.  Daily application of broad spectrum spf 30+ sunscreen to face is recommended to reduce flares.    Treatment Plan Start doxycycline 40 mg take 1 po every day with food dsp #30 5Rf. If not covered, will send in doxycycline 20 mg 2 po every day. Doxycycline should be taken with food to prevent nausea. Do not lay down for 30 minutes after taking. Be cautious with sun exposure and use good sun protection while on this medication. Pregnant women should not take this medication.  Start metronidazole 0.75% gel Apply to face nightly dsp 45g 5Rf. Start Clindamycin Foam Apply to affected areas scalp, chest, back, face 1-2 times daily as needed dsp 100 g 5Rf. Recommend OTC benzoyl peroxide cleanser, wash affected areas daily in shower, let sit several minutes prior to rinsing.  May bleach towels if not rinsed off completely.  Recommended brands include Panoxyl 4% Creamy Wash, CeraVe Acne Foaming Cream wash, or Cetaphil Gentle Clear Complexion-Clearing BPO Acne Cleanser.   Return in about 1 year (around 03/26/2024) for TBSE.  ICherlyn Labella, CMA, am acting as scribe for Willeen Niece, MD .   Documentation: I have reviewed the above documentation for accuracy and completeness, and I agree with the above.  Willeen Niece, MD

## 2023-03-27 NOTE — Patient Instructions (Addendum)
Recommend OTC benzoyl peroxide cleanser, wash affected areas daily in shower, let sit several minutes prior to rinsing.  May bleach towels if not rinsed off completely.  Recommended brands include Panoxyl 4% Creamy Wash, CeraVe Acne Foaming Cream wash, or Cetaphil Gentle Clear Complexion-Clearing BPO Acne Cleanser.   Rosacea  What is rosacea? Rosacea (say: ro-zay-sha) is a common skin disease that usually begins as a trend of flushing or blushing easily.  As rosacea progresses, a persistent redness in the center of the face will develop and may gradually spread beyond the nose and cheeks to the forehead and chin.  In some cases, the ears, chest, and back could be affected.  Rosacea may appear as tiny blood vessels or small red bumps that occur in crops.  Frequently they can contain pus, and are called "pustules".  If the bumps do not contain pus, they are referred to as "papules".  Rarely, in prolonged, untreated cases of rosacea, the oil glands of the nose and cheeks may become permanently enlarged.  This is called rhinophyma, and is seen more frequently in men.  Signs and Risks In its beginning stages, rosacea tends to come and go, which makes it difficult to recognize.  It can start as intermittent flushing of the face.  Eventually, blood vessels may become permanently visible.  Pustules and papules can appear, but can be mistaken for adult acne.  People of all races, ages, genders and ethnic groups are at risk of developing rosacea.  However, it is more common in women (especially around menopause) and adults with fair skin between the ages of 44 and 14.  Treatment Dermatologists typically recommend a combination of treatments to effectively manage rosacea.  Treatment can improve symptoms and may stop the progression of the rosacea.  Treatment may involve both topical and oral medications.  The tetracycline antibiotics are often used for their anti-inflammatory effect; however, because of the  possibility of developing antibiotic resistance, they should not be used long term at full dose.  For dilated blood vessels the options include electrodessication (uses electric current through a small needle), laser treatment, and cosmetics to hide the redness.   With all forms of treatment, improvement is a slow process, and patients may not see any results for the first 3-4 weeks.  It is very important to avoid the sun and other triggers.  Patients must wear sunscreen daily.  Skin Care Instructions: Cleanse the skin with a mild soap such as CeraVe cleanser, Cetaphil cleanser, or Dove soap once or twice daily as needed. Moisturize with Eucerin Redness Relief Daily Perfecting Lotion (has a subtle green tint), CeraVe Moisturizing Cream, or Oil of Olay Daily Moisturizer with sunscreen every morning and/or night as recommended. Makeup should be "non-comedogenic" (won't clog pores) and be labeled "for sensitive skin". Good choices for cosmetics are: Neutrogena, Almay, and Physician's Formula.  Any product with a green tint tends to offset a red complexion. If your eyes are dry and irritated, use artificial tears 2-3 times per day and cleanse the eyelids daily with baby shampoo.  Have your eyes examined at least every 2 years.  Be sure to tell your eye doctor that you have rosacea. Alcoholic beverages tend to cause flushing of the skin, and may make rosacea worse. Always wear sunscreen, protect your skin from extreme hot and cold temperatures, and avoid spicy foods, hot drinks, and mechanical irritation such as rubbing, scrubbing, or massaging the face.  Avoid harsh skin cleansers, cleansing masks, astringents, and exfoliation. If a  particular product burns or makes your face feel tight, then it is likely to flare your rosacea. If you are having difficulty finding a sunscreen that you can tolerate, you may try switching to a chemical-free sunscreen.  These are ones whose active ingredient is zinc oxide or  titanium dioxide only.  They should also be fragrance free, non-comedogenic, and labeled for sensitive skin. Rosacea triggers may vary from person to person.  There are a variety of foods that have been reported to trigger rosacea.  Some patients find that keeping a diary of what they were doing when they flared helps them avoid triggers.   Due to recent changes in healthcare laws, you may see results of your pathology and/or laboratory studies on MyChart before the doctors have had a chance to review them. We understand that in some cases there may be results that are confusing or concerning to you. Please understand that not all results are received at the same time and often the doctors may need to interpret multiple results in order to provide you with the best plan of care or course of treatment. Therefore, we ask that you please give [place] 2 business days to thoroughly review all your results before contacting the office for clarification. Should we see a critical lab result, you will be contacted sooner.   If You Need Anything After Your Visit  If you have any questions or concerns for your doctor, please call our main line at [phone] and press option 4 to reach your doctor's medical assistant. If no one answers, please leave a voicemail as directed and we will return your call as soon as possible. Messages left after 4 pm will be answered the following business day.   You may also send [place] a message via MyChart. We typically respond to MyChart messages within 1-2 business days.  For prescription refills, please ask your pharmacy to contact our office. Our fax number is [phone].  If you have an urgent issue when the clinic is closed that cannot wait until the next business day, you can page your doctor at the number below.    Please note that while we do our best to be available for urgent issues outside of office hours, we are not available 24/7.   If you have an urgent issue and are  unable to reach [place], you may choose to seek medical care at your doctor's office, retail clinic, urgent care center, or emergency room.  If you have a medical emergency, please immediately call 911 or go to the emergency department.  Pager Numbers  - Dr. Gwen Pounds: [phone]  - Dr. Roseanne Reno: [phone]  - Dr. Katrinka Blazing: [phone]   In the event of inclement weather, please call our main line at [phone] for an update on the status of any delays or closures.  Dermatology Medication Tips: Please keep the boxes that topical medications come in in order to help keep track of the instructions about where and how to use these. Pharmacies typically print the medication instructions only on the boxes and not directly on the medication tubes.   If your medication is too expensive, please contact our office at [phone] option 4 or send [place] a message through MyChart.   We are unable to tell what your co-pay for medications will be in advance as this is different depending on your insurance coverage. However, we may be able to find a substitute medication at lower cost or fill out paperwork to get insurance to cover  a needed medication.   If a prior authorization is required to get your medication covered by your insurance company, please allow [place] 1-2 business days to complete this process.  Drug prices often vary depending on where the prescription is filled and some pharmacies may offer cheaper prices.  The website www.goodrx.com contains coupons for medications through different pharmacies. The prices here do not account for what the cost may be with help from insurance (it may be cheaper with your insurance), but the website can give you the price if you did not use any insurance.  - You can print the associated coupon and take it with your prescription to the pharmacy.  - You may also stop by our office during regular business hours and pick up a GoodRx coupon card.  - If you need your  prescription sent electronically to a different pharmacy, notify our office through [facility] or by phone at [phone] option 4.     Si Usted Necesita Algo Despus de Su Visita  Tambin puede enviarnos un mensaje a travs de Clinical cytogeneticist. Por lo general respondemos a los mensajes de MyChart en el transcurso de 1 a 2 das hbiles.  Para renovar recetas, por favor pida a su farmacia que se ponga en contacto con nuestra oficina. Annie Sable de fax es [place] [phone].  Si tiene un asunto urgente cuando la clnica est cerrada y que no puede esperar hasta el siguiente da hbil, puede llamar/localizar a su doctor(a) al nmero que aparece a continuacin.   Por favor, tenga en cuenta que aunque hacemos todo lo posible para estar disponibles para asuntos urgentes fuera del horario de [place], no estamos disponibles las 24 horas del da, los 7 [address]  [address] de la [place].   Si tiene un problema urgente y no puede comunicarse con nosotros, puede optar por buscar atencin mdica  en el consultorio de su doctor(a), en una clnica privada, en un centro de atencin urgente o en una sala de emergencias.  Si tiene Engineer, drilling, por favor llame inmediatamente al 911 o vaya a la sala de emergencias.  Nmeros de bper  - Dr. Gwen Pounds: [phone]  - Dra. Roseanne Reno: [phone]  - Dr. Katrinka Blazing: [phone]   En caso de inclemencias del tiempo, por favor llame a Lacy Duverney principal al [phone] para una actualizacin sobre el [place] de cualquier retraso o cierre.  Consejos para la medicacin en dermatologa: Por favor, guarde las cajas en las que vienen los medicamentos de uso tpico para ayudarle a seguir las instrucciones sobre dnde y cmo usarlos. Las farmacias generalmente imprimen las instrucciones del medicamento slo en las cajas y no directamente en los tubos del [place].   Si su medicamento es muy caro, por favor, pngase en contacto con Rolm Gala llamando al  [phone] y presione la opcin 4 o envenos un mensaje a travs de Clinical cytogeneticist.   No podemos decirle cul ser su copago por los medicamentos por adelantado ya que esto es diferente dependiendo de la cobertura de su seguro. Sin embargo, es posible que podamos encontrar un medicamento sustituto a Audiological scientist un formulario para que el seguro cubra el medicamento que se considera necesario.   Si se requiere una autorizacin previa para que su compaa de seguros [place] su medicamento, por favor permtanos de 1 a 2 das hbiles para completar [address]  Los precios de los medicamentos varan con frecuencia dependiendo del Environmental consultant de dnde se surte la receta y alguna farmacias pueden ofrecer precios ms  baratos.  El sitio web www.goodrx.com tiene cupones para medicamentos de Health and safety inspector. Los precios aqu no tienen en cuenta lo que podra costar con la ayuda del seguro (puede ser ms barato con su seguro), pero el sitio web puede darle el precio si no utiliz Tourist information centre manager.  - Puede imprimir el cupn correspondiente y llevarlo con su receta a la farmacia.  - Tambin puede pasar por nuestra oficina durante el horario de atencin regular y Education officer, museum una tarjeta de cupones de GoodRx.  - Si necesita que su receta se enve electrnicamente a una farmacia diferente, informe a nuestra oficina a travs de MyChart de [place] o por telfono llamando al [phone] y presione la opcin 4.

## 2023-04-03 ENCOUNTER — Other Ambulatory Visit: Payer: Self-pay

## 2023-04-03 MED ORDER — DOXYCYCLINE HYCLATE 20 MG PO TABS: ORAL_TABLET | ORAL | 11 refills | Status: DC

## 2023-04-03 NOTE — Progress Notes (Signed)
Name: Victor Valdez   MRN: [id number]    DOB: 1965/06/22   Date:04/09/2023       Progress Note  Subjective  Chief Complaint  Chief Complaint  Patient presents with   Medical Management of Chronic Issues    HPI  Discussed the use of AI scribe software for clinical note transcription with the patient, who gave verbal consent to proceed.  History of Present Illness   The patient, with a history of hypercalcemia, presented for a regular follow-up. He reported having regular calcium checks as per the endocrinologist's recommendation. The last lab was in mid-October, with the calcium level normalizing since a hospital stay in August. The patient also reported episodes of passing out, with a neurologist's note indicating a single episode of convulsive syncope and is scheduled for an EEG 04/19/2023 .  The patient has been prescribed topiramate, a migraine and seizure medication, and reported no side effects such as tingling or numbness. He also reported good sleep quality with the use of trazodone and regular use of a CPAP machine for sleep apnea. The patient has a history of alcoholism, but has been attending AA meetings and has not been drinking since a relapse in July.  The patient was previously on Adderall for ADD, but this was stopped due to an episode of narrow complex tachycardia. He has been on Strattera for a long time, but is considering stopping it to assess its effectiveness.    The patient also reported dysphagia, feeling like food gets stuck in the middle of his chest. He is currently on famotidine for heartburn, as he cannot take a PPI due to high calcium levels.  The patient has been taking Wegovy for weight loss since August of the previous year, with a significant decrease in weight. However, he reported episodes of diarrhea over the past month, occurring about two days a week. The patient also has a history of supraventricular tachycardia and elevated liver enzymes. He is  currently on olmesartan for high blood pressure, with blood pressure readings at home averaging 125-130. The patient also has a history of slightly high cholesterol, managed with Lovaza.         Patient Active Problem List   Diagnosis Date Noted   Normocytic anemia 02/08/2023   Abnormal serum immunoelectrophoresis 01/09/2023   Iron deficiency anemia 12/26/2021   Gastroesophageal reflux disease without esophagitis 12/26/2021   Obesity (BMI 30.0-34.9) 12/26/2021   Snoring 12/26/2021   Stage 3a chronic kidney disease (HCC) 12/26/2021   Mild dilation of ascending aorta (HCC) 04/04/2020   Hepatic steatosis 04/04/2020   Low serum parathyroid hormone (PTH) 04/04/2020   Hypomagnesemia 04/04/2020   Hypercalcemia 04/04/2020   Narrow complex tachycardia (HCC) 03/30/2020   Syncope 03/30/2020   Internal hemorrhoids 10/23/2016   Diverticulosis 10/23/2016   ADD (attention deficit disorder) 11/13/2014   Basal cell carcinoma of face 11/13/2014   CD (contact dermatitis) 11/13/2014   Dyslipidemia 11/13/2014   Blood glucose elevated 11/13/2014   Benign hypertension 11/13/2014   Perennial allergic rhinitis with seasonal variation 11/13/2014   Allergic rhinitis 11/13/2014    Past Surgical History:  Procedure Laterality Date   COLONOSCOPY WITH PROPOFOL N/A 10/08/2016   Procedure: COLONOSCOPY WITH PROPOFOL;  Surgeon: Christena Deem, MD;  Location: [facility] ENDOSCOPY;  Service: Endoscopy;  Laterality: N/A;   EYE SURGERY Bilateral    lasik   LASIK Bilateral 12.30.2015    Family History  Problem Relation Age of Onset   Hypertension Mother    Cancer Father 19  prostate cancer   Hypertension Sister    Obesity Sister     Social History   Tobacco Use   Smoking status: Never   Smokeless tobacco: Former  Substance Use Topics   Alcohol use: Yes    Alcohol/week: 0.0 standard drinks of alcohol     Current Outpatient Medications:    atenolol (TENORMIN) 25 MG tablet, TAKE 1 TABLET BY MOUTH  IN THE  EVENING, Disp: 90 tablet, Rfl: 0   atomoxetine (STRATTERA) 40 MG capsule, Take 1 capsule (40 mg total) by mouth daily., Disp: 90 capsule, Rfl: 1   famotidine (PEPCID) 40 MG tablet, Take 1 tablet (40 mg total) by mouth daily. In place of Zantac otc, Disp: 90 tablet, Rfl: 2   metroNIDAZOLE (METROGEL) 0.75 % gel, Apply to face every night for rosacea., Disp: 45 g, Rfl: 5   olmesartan (BENICAR) 40 MG tablet, Take 1 tablet (40 mg total) by mouth daily., Disp: 90 tablet, Rfl: 0   omega-3 acid ethyl esters (LOVAZA) 1 g capsule, Take 2 capsules (2 g total) by mouth 2 (two) times daily., Disp: 360 capsule, Rfl: 1   tadalafil (CIALIS) 20 MG tablet, Take 1 tablet (20 mg total) by mouth every other day as needed for erectile dysfunction., Disp: 30 tablet, Rfl: 1   [START ON 05/02/2023] topiramate (TOPAMAX) 50 MG tablet, Take 1 tablet by mouth at bedtime., Disp: , Rfl:    traZODone (DESYREL) 100 MG tablet, Take 1 tablet (100 mg total) by mouth at bedtime., Disp: 90 tablet, Rfl: 2   Clindamycin Phosphate foam, Apply to affected areas scalp, chest, back once to twice daily as needed. (Patient not taking: Reported on 04/09/2023), Disp: 100 g, Rfl: 5   doxycycline (PERIOSTAT) 20 MG tablet, Take 2 tabs po QD. Take with food and plenty of drink. (Patient not taking: Reported on 04/09/2023), Disp: 60 tablet, Rfl: 11   hydrALAZINE (APRESOLINE) 10 MG tablet, Take 1-2 tablets (10-20 mg total) by mouth 4 (four) times daily as needed. If BP above 150/90 (Patient not taking: Reported on 04/09/2023), Disp: 60 tablet, Rfl: 0   Semaglutide-Weight Management (WEGOVY) 2.4 MG/0.75ML SOAJ, Inject 2.4 mg into the skin once a week. (Patient not taking: Reported on 04/09/2023), Disp: 9 mL, Rfl: 0   topiramate (TOPAMAX) 25 MG tablet, Take by mouth. (Patient not taking: Reported on 04/09/2023), Disp: , Rfl:   Allergies  Allergen Reactions   Aspirin Anaphylaxis   Nsaids    Penicillins     I personally reviewed active problem  list, medication list, allergies with the patient/caregiver today.   ROS  Ten systems reviewed and is negative except as mentioned in HPI    Objective  Vitals:   04/09/23 1018  BP: 100/74  Pulse: 96  Resp: 16  Temp: 97.7 F (36.5 C)  TempSrc: Oral  SpO2: 98%  Weight: 209 lb 4.8 oz (94.9 kg)  Height: 5\' 10"  (1.778 m)    Body mass index is 30.03 kg/m.  Physical Exam  Constitutional: Patient appears well-developed and well-nourished. Obese  No distress.  HEENT: head atraumatic, normocephalic Cardiovascular: Normal rate, regular rhythm and normal heart sounds.  No murmur heard. No BLE edema. Pulmonary/Chest: Effort normal and breath sounds normal. No respiratory distress. Psychiatric: Patient has a normal mood and affect. behavior is normal. Judgment and thought content normal.   PHQ2/9:    04/09/2023   10:25 AM 01/18/2023    9:50 AM 01/08/2023    8:51 AM 12/25/2022   11:21 AM 10/08/2022  8:36 AM  Depression screen PHQ 2/9  Decreased Interest 0 0 0 0 0  Down, Depressed, Hopeless 0 0 0 0 0  PHQ - 2 Score 0 0 0 0 0  Altered sleeping 0  0 0 0  Tired, decreased energy 0  0 0 0  Change in appetite 0  0 0 1  Feeling bad or failure about yourself  0  0 0 0  Trouble concentrating 0  0 1 0  Moving slowly or fidgety/restless 0  0 0 0  Suicidal thoughts 0  0 0 0  PHQ-9 Score 0  0 1 1  Difficult doing work/chores Not difficult at all  Not difficult at all Not difficult at all Not difficult at all    phq 9 is negative   Fall Risk:    04/09/2023   10:17 AM 01/08/2023    8:50 AM 12/25/2022   11:20 AM 10/08/2022    8:02 AM 04/27/2022    7:51 AM  Fall Risk   Falls in the past year? 1 1 1  0 0  Number falls in past yr: 0 1 1  0  Injury with Fall? 0 0 0  0  Risk for fall due to : Impaired balance/gait  History of fall(s) No Fall Risks No Fall Risks  Follow up Falls prevention discussed;Education provided;Falls evaluation completed  Falls evaluation completed;Falls prevention  discussed;Education provided Falls prevention discussed Falls prevention discussed    Functional Status Survey: Is the patient deaf or have difficulty hearing?: No Does the patient have difficulty seeing, even when wearing glasses/contacts?: No Does the patient have difficulty concentrating, remembering, or making decisions?: No Does the patient have difficulty walking or climbing stairs?: No Does the patient have difficulty dressing or bathing?: No Does the patient have difficulty doing errands alone such as visiting a doctor's office or shopping?: No    Assessment & Plan  Assessment and Plan    Hypercalcemia Regular calcium checks recommended by endocrinologist. Last ionized calcium level was normal (9.6). -Continue regular calcium checks as directed by endocrinologist.  Anemia Mild anemia noted on recent labs. -Monitor hemoglobin levels.  Seizure-like episodes Single episode of convulsive syncope in July 2024. EEG scheduled for December 13th. Currently on Topiramate 25mg , due to increase to 50mg . -Continue Topiramate, increase to 50mg  as directed. -Complete EEG as scheduled.  Gastroesophageal Reflux Disease (GERD) Complaints of nighttime acid reflux and dysphagia. Currently on Famotidine. -Refer to GI for evaluation and possible endoscopy. -Continue Famotidine.  Attention Deficit Disorder (ADD) Currently on Strattera 40mg , unsure of effectiveness. -Trial discontinuation of Strattera for 2 weeks to assess impact.  Hypertension Well controlled on Olmesartan 40mg  and Atenolol. Hydralazine as needed, but likely not necessary due to good blood pressure control. -Continue Olmesartan and Atenolol. -Discontinue Hydralazine.   SVT controlled with Atenolol, he will send me a copy of Zio patch results - done at [facility]  Alcoholism In remission, attending AA meetings. Last drink in July 2024. -Continue AA meetings and abstinence from alcohol.  Obesity Weight loss achieved with  [id number]. Complaints of intermittent diarrhea. -Continue Wegovy. -Trial of Imodium for diarrhea episodes.  Sleep Apnea Well controlled with CPAP and Trazodone for sleep. -Continue CPAP and Trazodone.  Hyperlipidemia On Lovaza for cholesterol management. -Continue Lovaza.  General Health Maintenance -Continue regular follow-ups with endocrinologist and psychiatrist. -Flu shot administered. -Consider shingles vaccine. -Follow-up in 3 months.

## 2023-04-03 NOTE — Progress Notes (Signed)
Doxycycline Monohydrate 40mg  po QD not covered, alternative sent of Doxycycline 20 mg 2 po QD.

## 2023-04-09 ENCOUNTER — Ambulatory Visit (INDEPENDENT_AMBULATORY_CARE_PROVIDER_SITE_OTHER): Payer: 59 | Admitting: Family Medicine

## 2023-04-09 ENCOUNTER — Encounter: Payer: Self-pay | Admitting: Family Medicine

## 2023-04-09 ENCOUNTER — Other Ambulatory Visit: Payer: Self-pay

## 2023-04-09 VITALS — BP 100/74 | HR 96 | Temp 97.7°F | Resp 16 | Ht 70.0 in | Wt 209.3 lb

## 2023-04-09 DIAGNOSIS — I471 Supraventricular tachycardia, unspecified: Secondary | ICD-10-CM

## 2023-04-09 DIAGNOSIS — I77810 Thoracic aortic ectasia: Secondary | ICD-10-CM

## 2023-04-09 DIAGNOSIS — R1319 Other dysphagia: Secondary | ICD-10-CM | POA: Diagnosis not present

## 2023-04-09 DIAGNOSIS — F1021 Alcohol dependence, in remission: Secondary | ICD-10-CM

## 2023-04-09 DIAGNOSIS — F901 Attention-deficit hyperactivity disorder, predominantly hyperactive type: Secondary | ICD-10-CM

## 2023-04-09 DIAGNOSIS — Z23 Encounter for immunization: Secondary | ICD-10-CM

## 2023-04-09 DIAGNOSIS — K219 Gastro-esophageal reflux disease without esophagitis: Secondary | ICD-10-CM

## 2023-04-09 DIAGNOSIS — F5221 Male erectile disorder: Secondary | ICD-10-CM

## 2023-04-09 DIAGNOSIS — E66811 Obesity, class 1: Secondary | ICD-10-CM

## 2023-04-09 DIAGNOSIS — I10 Essential (primary) hypertension: Secondary | ICD-10-CM

## 2023-04-09 MED ORDER — WEGOVY 2.4 MG/0.75ML ~~LOC~~ SOAJ
2.4000 mg | SUBCUTANEOUS | 0 refills | Status: DC
  Filled 2023-04-09: qty 3, 28d supply, fill #0
  Filled 2023-05-09 – 2023-05-17 (×3): qty 3, 28d supply, fill #1
  Filled 2023-06-14: qty 3, 28d supply, fill #2

## 2023-04-09 MED ORDER — ATENOLOL 25 MG PO TABS
25.0000 mg | ORAL_TABLET | Freq: Every evening | ORAL | 1 refills | Status: DC
Start: 2023-04-09 — End: 2023-12-04

## 2023-04-09 MED ORDER — TADALAFIL 20 MG PO TABS: 20.0000 mg | ORAL_TABLET | ORAL | 1 refills | Status: DC | PRN

## 2023-04-09 MED ORDER — OLMESARTAN MEDOXOMIL 40 MG PO TABS
40.0000 mg | ORAL_TABLET | Freq: Every day | ORAL | 0 refills | Status: DC
Start: 2023-04-09 — End: 2023-06-04

## 2023-04-09 NOTE — Telephone Encounter (Signed)
Printed out form for today appt

## 2023-05-09 ENCOUNTER — Other Ambulatory Visit: Payer: Self-pay

## 2023-05-10 ENCOUNTER — Other Ambulatory Visit: Payer: Self-pay

## 2023-05-10 ENCOUNTER — Other Ambulatory Visit: Payer: Self-pay | Admitting: Family Medicine

## 2023-05-13 ENCOUNTER — Other Ambulatory Visit: Payer: Self-pay

## 2023-05-14 ENCOUNTER — Encounter: Payer: Self-pay | Admitting: Family Medicine

## 2023-05-17 ENCOUNTER — Other Ambulatory Visit: Payer: Self-pay

## 2023-05-22 ENCOUNTER — Encounter: Payer: Self-pay | Admitting: Gastroenterology

## 2023-05-22 ENCOUNTER — Other Ambulatory Visit: Payer: Self-pay

## 2023-05-22 ENCOUNTER — Ambulatory Visit (INDEPENDENT_AMBULATORY_CARE_PROVIDER_SITE_OTHER): Payer: Self-pay | Admitting: Gastroenterology

## 2023-05-22 VITALS — BP 162/113 | HR 70 | Temp 97.6°F | Ht 70.0 in | Wt 211.1 lb

## 2023-05-22 DIAGNOSIS — K219 Gastro-esophageal reflux disease without esophagitis: Secondary | ICD-10-CM | POA: Diagnosis not present

## 2023-05-22 DIAGNOSIS — R1310 Dysphagia, unspecified: Secondary | ICD-10-CM

## 2023-05-22 DIAGNOSIS — D509 Iron deficiency anemia, unspecified: Secondary | ICD-10-CM

## 2023-05-22 MED ORDER — NA SULFATE-K SULFATE-MG SULF 17.5-3.13-1.6 GM/177ML PO SOLN
354.0000 mL | Freq: Once | ORAL | 0 refills | Status: AC
  Filled 2023-05-22 – 2023-06-11 (×2): qty 354, 1d supply, fill #0

## 2023-05-22 NOTE — Progress Notes (Signed)
 Karma Oz, MD [address]  [address]  [place], [place] [address]  Main: [phone]  Fax: [phone]    Gastroenterology Consultation  Referring Provider:     Sowles, Krichna, MD Primary Care Physician:  Sowles, Krichna, MD Primary Gastroenterologist:  Dr. Karma Oz Reason for Consultation: Iron deficiency anemia        HPI:   Victor Valdez is a 58 y.o. male referred by Dr. Sowles, Krichna, MD  for consultation & management of elevated LFTs.  Patient is first noted to have elevated LFTs, predominantly transaminases since 01/2017.  AST forty-eight, ALT fifty-one, rest of the liver enzymes were normal.  His LFTs peaked at AST 215, ALT 190 in 07/2018.  Most recently in November 2021, AST 125, ALT 176.  Patient attributes his elevated liver enzymes secondary to heavy alcohol use.  He said he quit drinking 3 months ago.  Prior to this, during the pandemic, he picked up on drinking scotch, about a gallon that would last for 3 to 4 days.  He self realized about heavy drinking and decided to quit.  He also started new medication for ADD, Strattera  switching from Adderall in November 2021.  He takes Restoril  as needed for insomnia.  HCV antibody negative, HbA1c normal, ultrasound abdomen normal, PTH, TSH normal  Follow-up visit 05/22/2023 Victor Valdez is here for evaluation of iron deficiency anemia.  He was found to have chronic normocytic anemia, recently diagnosed with iron deficiency, serum ferritin less than 10.  Started taking iron supplements over-the-counter.  Also, followed by Dr. Wilhelmenia Harada, heme-onc.  Most recent hemoglobin was 10.1 in 9/24.  He is LFTs have normalized since last visit.  He quit drinking alcohol.  He did not undergo ultrasound liver Doppler. He also reports experiencing nocturnal heartburn, taking famotidine  once a day and also notices food passing slowly in his lower esophagus.  He denies any weight loss.  He denies any other GI symptoms  He does not  smoke He is an attorney for [employer]  NSAIDs: None  Antiplts/Anticoagulants/Anti thrombotics: None  GI Procedures: Colonoscopy 10/08/2016 for screening by Dr. Peg Bouton - Diverticulosis in the sigmoid colon. - Non-bleeding internal hemorrhoids. - The examination was otherwise normal. - No specimens collected.  Past Medical History:  Diagnosis Date   Adult attention deficit disorder    Allergy    Basal cell carcinoma    R shoulder per pt    Basal cell epithelioma, face    Dr. Elner Hahn, R eye   Dysplastic nevus 04/20/2019   Left superior buttocks/posterior waistline. Mild to moderate atypia, margins free.   Dysplastic nevus 10/18/2014   Right mid back, moderate   Hyperglycemia    Hyperlipidemia    Hypertension    Snoring     Past Surgical History:  Procedure Laterality Date   COLONOSCOPY WITH PROPOFOL  N/A 10/08/2016   Procedure: COLONOSCOPY WITH PROPOFOL ;  Surgeon: Deveron Fly, MD;  Location: [facility] ENDOSCOPY;  Service: Endoscopy;  Laterality: N/A;   EYE SURGERY Bilateral    lasik   LASIK Bilateral 12.30.2015    Current Outpatient Medications:    atenolol  (TENORMIN ) 25 MG tablet, Take 1 tablet (25 mg total) by mouth every evening., Disp: 90 tablet, Rfl: 1   atomoxetine  (STRATTERA ) 40 MG capsule, Take 1 capsule (40 mg total) by mouth daily., Disp: 90 capsule, Rfl: 1   Clindamycin  Phosphate foam, Apply to affected areas scalp, chest, back once to twice daily as needed., Disp: 100 g, Rfl: 5  metroNIDAZOLE  (METROGEL ) 0.75 % gel, Apply to face every night for rosacea., Disp: 45 g, Rfl: 5   olmesartan  (BENICAR ) 40 MG tablet, Take 1 tablet (40 mg total) by mouth daily., Disp: 90 tablet, Rfl: 0   omega-3 acid ethyl esters (LOVAZA ) 1 g capsule, Take 2 capsules (2 g total) by mouth 2 (two) times daily., Disp: 360 capsule, Rfl: 1   raNITIdine HCl (ZANTAC 75 PO), Take by mouth., Disp: , Rfl:    Semaglutide -Weight Management (WEGOVY ) 2.4 MG/0.75ML SOAJ, Inject 2.4 mg into the  skin once a week., Disp: 9 mL, Rfl: 0   tadalafil  (CIALIS ) 20 MG tablet, Take 1 tablet (20 mg total) by mouth every other day as needed for erectile dysfunction., Disp: 30 tablet, Rfl: 1   topiramate (TOPAMAX) 50 MG tablet, Take 1 tablet by mouth at bedtime., Disp: , Rfl:    traZODone  (DESYREL ) 100 MG tablet, Take 1 tablet (100 mg total) by mouth at bedtime., Disp: 90 tablet, Rfl: 2   famotidine  (PEPCID ) 40 MG tablet, Take 1 tablet (40 mg total) by mouth daily. In place of Zantac otc (Patient not taking: Reported on 05/22/2023), Disp: 90 tablet, Rfl: 2   Family History  Problem Relation Age of Onset   Hypertension Mother    Cancer Father 60       prostate cancer   Hypertension Sister    Obesity Sister      Social History   Tobacco Use   Smoking status: Never   Smokeless tobacco: Former  Building services engineer status: Never Used  Substance Use Topics   Alcohol use: Not Currently    Comment: States stopped on 10/05/2020 Had a slipped up and drank one day on 11/07/2022   Drug use: No    Allergies as of 05/22/2023 - Review Complete 05/22/2023  Allergen Reaction Noted   Aspirin Anaphylaxis 11/12/2014   Nsaids  07/17/2017   Penicillins  11/12/2014    Review of Systems:    All systems reviewed and negative except where noted in HPI.   Physical Exam:  BP (!) 154/112 (BP Location: Right Arm, Patient Position: Sitting, Cuff Size: Normal)   Pulse 75   Temp 97.6 F (36.4 C) (Oral)   Ht 5\' 10"  (1.778 m)   Wt 211 lb 2 oz (95.8 kg)   BMI 30.29 kg/m  No LMP for male patient.  General:   Alert,  Well-developed, well-nourished, pleasant and cooperative in NAD Head:  Normocephalic and atraumatic. Eyes:  Sclera clear, no icterus.   Conjunctiva pink. Ears:  Normal auditory acuity. Nose:  No deformity, discharge, or lesions. Mouth:  No deformity or lesions,oropharynx pink & moist. Neck:  Supple; no masses or thyromegaly. Lungs:  Respirations even and unlabored.  Clear throughout to  auscultation.   No wheezes, crackles, or rhonchi. No acute distress. Heart:  Regular rate and rhythm; no murmurs, clicks, rubs, or gallops. Abdomen:  Normal bowel sounds. Soft, non-tender and non-distended without masses, hepatosplenomegaly or hernias noted.  No guarding or rebound tenderness.   Rectal: Not performed Msk:  Symmetrical without gross deformities. Good, equal movement & strength bilaterally. Pulses:  Normal pulses noted. Extremities:  No clubbing or edema.  No cyanosis. Neurologic:  Alert and oriented x3;  grossly normal neurologically. Skin:  Intact without significant lesions or rashes. No jaundice. Psych:  Alert and cooperative. Normal mood and affect.  Imaging Studies: Reviewed  Assessment and Plan:   Victor Valdez is a 58 y.o. pleasant Caucasian male with history  of hypertension, hyperlipidemia, ADD with chronic iron deficiency anemia, chronic GERD with dysphagia  Chronic iron deficiency anemia Discussed about upper endoscopy and colonoscopy, possible video capsule endoscopy for further evaluation Continue oral iron supplement Follow-up with Dr. Wilhelmenia Harada  Chronic GERD, nocturnal heartburn and dysphagia to solids Recommend EGD for further evaluation Increase Pepcid  to twice daily  Follow up based on the above workup   Karma Oz, MD

## 2023-05-22 NOTE — Addendum Note (Signed)
 Addended by: Conny Del L on: 05/22/2023 01:18 PM   Modules accepted: Orders

## 2023-06-03 ENCOUNTER — Other Ambulatory Visit: Payer: Self-pay | Admitting: Family Medicine

## 2023-06-03 DIAGNOSIS — I10 Essential (primary) hypertension: Secondary | ICD-10-CM

## 2023-06-04 NOTE — Telephone Encounter (Signed)
Spoke with pt and appt switched to you for 07/05/23

## 2023-06-07 ENCOUNTER — Other Ambulatory Visit: Payer: Self-pay

## 2023-06-11 ENCOUNTER — Other Ambulatory Visit: Payer: Self-pay

## 2023-06-13 ENCOUNTER — Other Ambulatory Visit: Payer: Self-pay

## 2023-06-13 ENCOUNTER — Ambulatory Visit: Payer: Managed Care, Other (non HMO) | Admitting: Anesthesiology

## 2023-06-13 ENCOUNTER — Ambulatory Visit
Admission: RE | Admit: 2023-06-13 | Discharge: 2023-06-13 | Disposition: A | Discharge status: Home / Self Care | Payer: Managed Care, Other (non HMO) | Attending: Gastroenterology | Admitting: Gastroenterology

## 2023-06-13 ENCOUNTER — Encounter
Admission: RE | Disposition: A | Discharge status: Home / Self Care | Payer: Self-pay | Source: Home / Self Care | Attending: Gastroenterology

## 2023-06-13 ENCOUNTER — Encounter: Payer: Self-pay | Admitting: Gastroenterology

## 2023-06-13 DIAGNOSIS — K219 Gastro-esophageal reflux disease without esophagitis: Secondary | ICD-10-CM | POA: Diagnosis not present

## 2023-06-13 DIAGNOSIS — K2100 Gastro-esophageal reflux disease with esophagitis, without bleeding: Secondary | ICD-10-CM | POA: Insufficient documentation

## 2023-06-13 DIAGNOSIS — K5730 Diverticulosis of large intestine without perforation or abscess without bleeding: Secondary | ICD-10-CM | POA: Diagnosis not present

## 2023-06-13 DIAGNOSIS — R1314 Dysphagia, pharyngoesophageal phase: Secondary | ICD-10-CM | POA: Insufficient documentation

## 2023-06-13 DIAGNOSIS — I471 Supraventricular tachycardia, unspecified: Secondary | ICD-10-CM | POA: Insufficient documentation

## 2023-06-13 DIAGNOSIS — D509 Iron deficiency anemia, unspecified: Secondary | ICD-10-CM | POA: Insufficient documentation

## 2023-06-13 DIAGNOSIS — K644 Residual hemorrhoidal skin tags: Secondary | ICD-10-CM | POA: Insufficient documentation

## 2023-06-13 DIAGNOSIS — F909 Attention-deficit hyperactivity disorder, unspecified type: Secondary | ICD-10-CM | POA: Insufficient documentation

## 2023-06-13 DIAGNOSIS — Z87891 Personal history of nicotine dependence: Secondary | ICD-10-CM | POA: Diagnosis not present

## 2023-06-13 DIAGNOSIS — K449 Diaphragmatic hernia without obstruction or gangrene: Secondary | ICD-10-CM | POA: Insufficient documentation

## 2023-06-13 DIAGNOSIS — I10 Essential (primary) hypertension: Secondary | ICD-10-CM | POA: Diagnosis not present

## 2023-06-13 DIAGNOSIS — K2210 Ulcer of esophagus without bleeding: Secondary | ICD-10-CM

## 2023-06-13 HISTORY — PX: COLONOSCOPY WITH PROPOFOL: SHX5780

## 2023-06-13 HISTORY — DX: Sleep apnea, unspecified: G47.30

## 2023-06-13 HISTORY — PX: ESOPHAGOGASTRODUODENOSCOPY (EGD) WITH PROPOFOL: SHX5813

## 2023-06-13 SURGERY — COLONOSCOPY WITH PROPOFOL
Anesthesia: General

## 2023-06-13 MED ORDER — GLYCOPYRROLATE 0.2 MG/ML IJ SOLN
INTRAMUSCULAR | Status: DC | PRN
  Administered 2023-06-13: .2 mg via INTRAVENOUS

## 2023-06-13 MED ORDER — MIDAZOLAM HCL 2 MG/2ML IJ SOLN
INTRAMUSCULAR | Status: AC
Start: 2023-06-13 — End: ?
  Filled 2023-06-13: qty 2

## 2023-06-13 MED ORDER — SODIUM CHLORIDE 0.9 % IV SOLN: INTRAVENOUS | Status: DC

## 2023-06-13 MED ORDER — LIDOCAINE HCL (CARDIAC) PF 100 MG/5ML IV SOSY
PREFILLED_SYRINGE | INTRAVENOUS | Status: DC | PRN
  Administered 2023-06-13: 100 mg via INTRAVENOUS

## 2023-06-13 MED ORDER — OMEPRAZOLE 40 MG PO CPDR: 40.0000 mg | DELAYED_RELEASE_CAPSULE | Freq: Two times a day (BID) | ORAL | 1 refills | Status: DC

## 2023-06-13 MED ORDER — MIDAZOLAM HCL 2 MG/2ML IJ SOLN
INTRAMUSCULAR | Status: DC | PRN
  Administered 2023-06-13: 2 mg via INTRAVENOUS

## 2023-06-13 MED ORDER — PROPOFOL 500 MG/50ML IV EMUL
INTRAVENOUS | Status: DC | PRN
  Administered 2023-06-13: 155 ug/kg/min via INTRAVENOUS

## 2023-06-13 MED ORDER — PROPOFOL 10 MG/ML IV BOLUS
INTRAVENOUS | Status: DC | PRN
  Administered 2023-06-13: 60 mg via INTRAVENOUS

## 2023-06-13 NOTE — Op Note (Signed)
 [facility] Gastroenterology Patient Name: Victor Valdez Procedure Date: 06/13/2023 10:35 AM MRN: [id number] Account #: [id number] Date of Birth: 02-10-1966 Admit Type: Outpatient Age: 58 Room: [facility] ENDO ROOM 2 Gender: Male Note Status: Finalized Instrument Name: Upper Endoscope [id number] Procedure:             Upper GI endoscopy Indications:           Esophageal dysphagia, Follow-up of gastro-esophageal                         reflux disease Providers:             Corinn Jess Brooklyn MD, MD Referring MD:          Dorette FALCON. Sowles, MD (Referring MD) Medicines:             General Anesthesia Complications:         No immediate complications. Estimated blood loss: None. Procedure:             Pre-Anesthesia Assessment:                        - Prior to the procedure, a History and Physical was                         performed, and patient medications and allergies were                         reviewed. The patient is competent. The risks and                         benefits of the procedure and the sedation options and                         risks were discussed with the patient. All questions                         were answered and informed consent was obtained.                         Patient identification and proposed procedure were                         verified by the physician, the nurse, the                         anesthesiologist, the anesthetist and the technician                         in the pre-procedure area in the procedure room in the                         endoscopy suite. Mental Status Examination: alert and                         oriented. Airway Examination: normal oropharyngeal                         airway and neck mobility. Respiratory Examination:  clear to auscultation. CV Examination: normal.                         Prophylactic Antibiotics: The patient does not require                         prophylactic  antibiotics. Prior Anticoagulants: The                         patient has taken no anticoagulant or antiplatelet                         agents. ASA Grade Assessment: III - A patient with                         severe systemic disease. After reviewing the risks and                         benefits, the patient was deemed in satisfactory                         condition to undergo the procedure. The anesthesia                         plan was to use general anesthesia. Immediately prior                         to administration of medications, the patient was                         re-assessed for adequacy to receive sedatives. The                         heart rate, respiratory rate, oxygen saturations,                         blood pressure, adequacy of pulmonary ventilation, and                         response to care were monitored throughout the                         procedure. The physical status of the patient was                         re-assessed after the procedure.                        After obtaining informed consent, the endoscope was                         passed under direct vision. Throughout the procedure,                         the patient's blood pressure, pulse, and oxygen                         saturations were monitored continuously. The Endoscope  was introduced through the mouth, and advanced to the                         second part of duodenum. The upper GI endoscopy was                         accomplished without difficulty. The patient tolerated                         the procedure well. Findings:      The duodenal bulb and second portion of the duodenum were normal.      A small hiatal hernia was present.      The entire examined stomach was normal.      LA Grade C (one or more mucosal breaks continuous between tops of 2 or       more mucosal folds, less than 75% circumference) esophagitis with no       bleeding was found  in the distal esophagus and gastroesophageal       junction. Biopsies were taken with a cold forceps for histology. Impression:            - Normal duodenal bulb and second portion of the                         duodenum.                        - Small hiatal hernia.                        - Normal stomach.                        - LA Grade C reflux esophagitis with no bleeding.                         Biopsied. Recommendation:        - Await pathology results.                        - Follow an antireflux regimen indefinitely.                        - Use a proton pump inhibitor PO BID indefinitely.                        - Repeat upper endoscopy in 3 months to check healing.                        - Return to my office as previously scheduled.                        - Proceed with colonoscopy as scheduled                        See colonoscopy report Procedure Code(s):     --- Professional ---                        [phone], Esophagogastroduodenoscopy, flexible,  transoral; with biopsy, single or multiple Diagnosis Code(s):     --- Professional ---                        K44.9, Diaphragmatic hernia without obstruction or                         gangrene                        K21.00, Gastro-esophageal reflux disease with                         esophagitis, without bleeding                        R13.14, Dysphagia, pharyngoesophageal phase CPT copyright 2022 American Medical Association. All rights reserved. The codes documented in this report are preliminary and upon coder review may  be revised to meet current compliance requirements. Dr. Angelita Brooklyn Corinn Jess Brooklyn MD, MD 06/13/2023 11:02:58 AM This report has been signed electronically. Number of Addenda: 0 Note Initiated On: 06/13/2023 10:35 AM Estimated Blood Loss:  Estimated blood loss: none.      [facility]

## 2023-06-13 NOTE — Anesthesia Procedure Notes (Signed)
 Procedure Name: General with mask airway Date/Time: 06/13/2023 10:51 AM  Performed by: Ledora Duncan, CRNAPre-anesthesia Checklist: Patient identified, Emergency Drugs available, Suction available and Patient being monitored Patient Re-evaluated:Patient Re-evaluated prior to induction Oxygen Delivery Method: Simple face mask Induction Type: IV induction Placement Confirmation: positive ETCO2 and breath sounds checked- equal and bilateral Dental Injury: Teeth and Oropharynx as per pre-operative assessment

## 2023-06-13 NOTE — Anesthesia Postprocedure Evaluation (Signed)
 Anesthesia Post Note  Patient: Victor Valdez  Procedure(s) Performed: COLONOSCOPY WITH PROPOFOL  ESOPHAGOGASTRODUODENOSCOPY (EGD) WITH PROPOFOL   Patient location during evaluation: PACU Anesthesia Type: General Level of consciousness: awake and alert, oriented and patient cooperative Pain management: pain level controlled Vital Signs Assessment: post-procedure vital signs reviewed and stable Respiratory status: spontaneous breathing, nonlabored ventilation and respiratory function stable Cardiovascular status: blood pressure returned to baseline and stable Postop Assessment: adequate PO intake Anesthetic complications: no   No notable events documented.   Last Vitals:  Vitals:   06/13/23 1116 06/13/23 1126  BP: 107/87 (P) 106/80  Pulse: 82 (P) 94  Resp: 14 (P) 14  Temp: (!) 35.9 C   SpO2: 100%     Last Pain:  Vitals:   06/13/23 1116  TempSrc:   PainSc: 0-No pain                 Alfonso Ruths

## 2023-06-13 NOTE — Op Note (Signed)
 [facility] Gastroenterology Patient Name: Victor Valdez Procedure Date: 06/13/2023 10:34 AM MRN: [id number] Account #: [id number] Date of Birth: 11-08-1965 Admit Type: Outpatient Age: 58 Room: [facility] ENDO ROOM 2 Gender: Male Note Status: Finalized Instrument Name: Colonscope [id number] Procedure:             Colonoscopy Indications:           Last colonoscopy: June 2018, Unexplained iron                         deficiency anemia Providers:             Corinn Jess Brooklyn MD, MD Referring MD:          Dorette FALCON. Sowles, MD (Referring MD) Medicines:             General Anesthesia Complications:         No immediate complications. Estimated blood loss: None. Procedure:             Pre-Anesthesia Assessment:                        - Prior to the procedure, a History and Physical was                         performed, and patient medications and allergies were                         reviewed. The patient is competent. The risks and                         benefits of the procedure and the sedation options and                         risks were discussed with the patient. All questions                         were answered and informed consent was obtained.                         Patient identification and proposed procedure were                         verified by the physician, the nurse, the                         anesthesiologist, the anesthetist and the technician                         in the pre-procedure area in the procedure room in the                         endoscopy suite. Mental Status Examination: alert and                         oriented. Airway Examination: normal oropharyngeal                         airway and neck mobility. Respiratory Examination:  clear to auscultation. CV Examination: normal.                         Prophylactic Antibiotics: The patient does not require                         prophylactic antibiotics. Prior  Anticoagulants: The                         patient has taken no anticoagulant or antiplatelet                         agents. ASA Grade Assessment: III - A patient with                         severe systemic disease. After reviewing the risks and                         benefits, the patient was deemed in satisfactory                         condition to undergo the procedure. The anesthesia                         plan was to use general anesthesia. Immediately prior                         to administration of medications, the patient was                         re-assessed for adequacy to receive sedatives. The                         heart rate, respiratory rate, oxygen saturations,                         blood pressure, adequacy of pulmonary ventilation, and                         response to care were monitored throughout the                         procedure. The physical status of the patient was                         re-assessed after the procedure.                        After obtaining informed consent, the colonoscope was                         passed under direct vision. Throughout the procedure,                         the patient's blood pressure, pulse, and oxygen                         saturations were monitored continuously. The  Colonoscope was introduced through the anus and                         advanced to the the terminal ileum, with                         identification of the appendiceal orifice and IC                         valve. The colonoscopy was performed without                         difficulty. The patient tolerated the procedure well.                         The quality of the bowel preparation was evaluated                         using the BBPS [facility] Bowel Preparation Scale) with                         scores of: Right Colon = 3, Transverse Colon = 3 and                         Left Colon = 3 (entire mucosa seen  well with no                         residual staining, small fragments of stool or opaque                         liquid). The total BBPS score equals 9. The terminal                         ileum, ileocecal valve, appendiceal orifice, and                         rectum were photographed. Findings:      The perianal and digital rectal examinations were normal. Pertinent       negatives include normal sphincter tone and no palpable rectal lesions.      The terminal ileum appeared normal.      Multiple diverticula were found in the recto-sigmoid colon and sigmoid       colon.      Non-bleeding external hemorrhoids were found during retroflexion. The       hemorrhoids were medium-sized. Impression:            - The examined portion of the ileum was normal.                        - Diverticulosis in the recto-sigmoid colon and in the                         sigmoid colon.                        - Non-bleeding external hemorrhoids.                        -  No specimens collected. Recommendation:        - Discharge patient to home (with escort).                        - Resume previous diet today.                        - Continue present medications. Procedure Code(s):     --- Professional ---                        [phone], Colonoscopy, flexible; diagnostic, including                         collection of specimen(s) by brushing or washing, when                         performed (separate procedure) Diagnosis Code(s):     --- Professional ---                        K64.4, Residual hemorrhoidal skin tags                        D50.9, Iron deficiency anemia, unspecified                        K57.30, Diverticulosis of large intestine without                         perforation or abscess without bleeding CPT copyright 2022 American Medical Association. All rights reserved. The codes documented in this report are preliminary and upon coder review may  be revised to meet current compliance  requirements. Dr. Angelita Brooklyn Corinn Jess Brooklyn MD, MD 06/13/2023 11:14:24 AM This report has been signed electronically. Number of Addenda: 0 Note Initiated On: 06/13/2023 10:34 AM Scope Withdrawal Time: 0 hours 6 minutes 47 seconds  Total Procedure Duration: 0 hours 8 minutes 18 seconds  Estimated Blood Loss:  Estimated blood loss: none.      [facility]

## 2023-06-13 NOTE — Transfer of Care (Signed)
 Immediate Anesthesia Transfer of Care Note  Patient: Victor Valdez  Procedure(s) Performed: COLONOSCOPY WITH PROPOFOL  ESOPHAGOGASTRODUODENOSCOPY (EGD) WITH PROPOFOL   Patient Location: Endoscopy Unit  Anesthesia Type:General  Level of Consciousness: drowsy and patient cooperative  Airway & Oxygen Therapy: Patient Spontanous Breathing and Patient connected to face mask oxygen  Post-op Assessment: Report given to RN and Post -op Vital signs reviewed and stable  Post vital signs: Reviewed and stable  Last Vitals:  Vitals Value Taken Time  BP 107/87 06/13/23 1116  Temp    Pulse 84 06/13/23 1117  Resp    SpO2 100 % 06/13/23 1117  Vitals shown include unfiled device data.  Last Pain:  Vitals:   06/13/23 1042  TempSrc: Temporal  PainSc: 0-No pain         Complications: No notable events documented.

## 2023-06-13 NOTE — H&P (Signed)
 Victor JONELLE Brooklyn, MD [address]  [address]  [place], [place] [address]  Main: [phone]  Fax: [phone] Pager: [phone]  Primary Care Physician:  Sowles, Krichna, MD Primary Gastroenterologist:  Dr. Corinn JONELLE Valdez  Pre-Procedure History & Physical: HPI:  Victor Valdez is a 58 y.o. male is here for an endoscopy and colonoscopy.   Past Medical History:  Diagnosis Date   Adult attention deficit disorder    Allergy    Basal cell carcinoma    R shoulder per pt    Basal cell epithelioma, face    Dr. Stephane Valdez, R eye   Dysplastic nevus 04/20/2019   Left superior buttocks/posterior waistline. Mild to moderate atypia, margins free.   Dysplastic nevus 10/18/2014   Right mid back, moderate   Hyperglycemia    Hyperlipidemia    Hypertension    Sleep apnea    Snoring     Past Surgical History:  Procedure Laterality Date   COLONOSCOPY WITH PROPOFOL  N/A 10/08/2016   Procedure: COLONOSCOPY WITH PROPOFOL ;  Surgeon: Victor Gladis PENNER, MD;  Location: [facility] ENDOSCOPY;  Service: Endoscopy;  Laterality: N/A;   EYE SURGERY Bilateral    lasik   LASIK Bilateral 12.30.2015    Prior to Admission medications   Medication Sig Start Date End Date Taking? Authorizing Provider  atenolol  (TENORMIN ) 25 MG tablet Take 1 tablet (25 mg total) by mouth every evening. 04/09/23   Sowles, Krichna, MD  atomoxetine  (STRATTERA ) 40 MG capsule Take 1 capsule (40 mg total) by mouth daily. 10/08/22   Sowles, Krichna, MD  Clindamycin  Phosphate foam Apply to affected areas scalp, chest, back once to twice daily as needed. 03/27/23   Victor Sawyer, MD  famotidine  (PEPCID ) 40 MG tablet Take 1 tablet (40 mg total) by mouth daily. In place of Zantac otc Patient not taking: Reported on 05/22/2023 01/08/23   Sowles, Krichna, MD  metroNIDAZOLE  (METROGEL ) 0.75 % gel Apply to face every night for rosacea. 03/27/23   Victor Sawyer, MD  olmesartan  (BENICAR ) 40 MG tablet TAKE 1 TABLET BY MOUTH DAILY 06/04/23   Sowles,  Krichna, MD  omega-3 acid ethyl esters (LOVAZA ) 1 g capsule Take 2 capsules (2 g total) by mouth 2 (two) times daily. 01/08/23   Sowles, Krichna, MD  raNITIdine HCl (ZANTAC 75 PO) Take by mouth.    [provider]  Semaglutide -Weight Management (WEGOVY ) 2.4 MG/0.75ML SOAJ Inject 2.4 mg into the skin once a week. 04/09/23   Sowles, Krichna, MD  tadalafil  (CIALIS ) 20 MG tablet Take 1 tablet (20 mg total) by mouth every other day as needed for erectile dysfunction. 04/09/23   Sowles, Krichna, MD  topiramate (TOPAMAX) 50 MG tablet Take 1 tablet by mouth at bedtime. 05/02/23 05/01/24  [provider]  traZODone  (DESYREL ) 100 MG tablet Take 1 tablet (100 mg total) by mouth at bedtime. 01/08/23   Sowles, Krichna, MD    Allergies as of 05/22/2023 - Review Complete 05/22/2023  Allergen Reaction Noted   Aspirin Anaphylaxis 11/12/2014   Nsaids  07/17/2017   Penicillins  11/12/2014    Family History  Problem Relation Age of Onset   Hypertension Mother    Cancer Father 2       prostate cancer   Hypertension Sister    Obesity Sister     Social History   Socioeconomic History   Marital status: Married    Spouse name: Victor Valdez   Number of children: 2   Years of education: Not on file   Highest  education level: Master's degree (e.g., MA, MS, MEng, MEd, MSW, MBA)  Occupational History   Not on file  Tobacco Use   Smoking status: Never   Smokeless tobacco: Former  Advertising Account Planner   Vaping status: Never Used  Substance and Sexual Activity   Alcohol use: Not Currently    Comment: States stopped on 10/05/2020 Had a slipped up and drank one day on 11/07/2022   Drug use: No   Sexual activity: Yes    Partners: Female    Birth control/protection: None  Other Topics Concern   Not on file  Social History Narrative   Not on file   Social Drivers of Health   Financial Resource Strain: Low Risk  (01/02/2023)   Received from [facility]   Overall Financial Resource  Strain (CARDIA)    Difficulty of Paying Living Expenses: Not hard at all  Food Insecurity: No Food Insecurity (01/18/2023)   Hunger Vital Sign    Worried About Running Out of Food in the Last Year: Never true    Ran Out of Food in the Last Year: Never true  Transportation Needs: No Transportation Needs (01/18/2023)   PRAPARE - Administrator, Civil Service (Medical): No    Lack of Transportation (Non-Medical): No  Physical Activity: Sufficiently Active (06/13/2020)   Exercise Vital Sign    Days of Exercise per Week: 7 days    Minutes of Exercise per Session: 50 min  Stress: No Stress Concern Present (06/13/2020)   [employer] of Occupational Health - Occupational Stress Questionnaire    Feeling of Stress : Only a little  Social Connections: Socially Integrated (06/13/2020)   Social Connection and Isolation Panel [NHANES]    Frequency of Communication with Friends and Family: More than three times a week    Frequency of Social Gatherings with Friends and Family: More than three times a week    Attends Religious Services: More than 4 times per year    Active Member of [employer] or Organizations: Yes    Attends Engineer, Structural: More than 4 times per year    Marital Status: Married  Catering Manager Violence: Not At Risk (01/18/2023)   Humiliation, Afraid, Rape, and Kick questionnaire    Fear of Current or Ex-Partner: No    Emotionally Abused: No    Physically Abused: No    Sexually Abused: No    Review of Systems: See HPI, otherwise negative ROS  Physical Exam: BP (!) 135/99   Pulse 75   Temp (!) 96.6 F (35.9 C)   Resp 14   Ht 5' 10 (1.778 m)   Wt 204 lb (92.5 kg)   SpO2 100%   BMI 29.27 kg/m  General:   Alert,  pleasant and cooperative in NAD Head:  Normocephalic and atraumatic. Neck:  Supple; no masses or thyromegaly. Lungs:  Clear throughout to auscultation.    Heart:  Regular rate and rhythm. Abdomen:  Soft, nontender and nondistended. Normal  bowel sounds, without guarding, and without rebound.   Neurologic:  Alert and  oriented x4;  grossly normal neurologically.  Impression/Plan: Victor Valdez is here for an endoscopy and colonoscopy to be performed for IDA, chronic GERD and dysphagia  Risks, benefits, limitations, and alternatives regarding  endoscopy and colonoscopy have been reviewed with the patient.  Questions have been answered.  All parties agreeable.   Victor Brooklyn, MD  06/13/2023, 11:23 AM

## 2023-06-13 NOTE — Anesthesia Preprocedure Evaluation (Addendum)
 Anesthesia Evaluation  Patient identified by MRN, date of birth, ID band Patient awake    Reviewed: Allergy & Precautions, NPO status , Patient's Chart, lab work & pertinent test results  History of Anesthesia Complications Negative for: history of anesthetic complications  Airway Mallampati: I   Neck ROM: Full    Dental no notable dental hx.    Pulmonary neg pulmonary ROS   Pulmonary exam normal breath sounds clear to auscultation       Cardiovascular hypertension, Normal cardiovascular exam+ dysrhythmias Supra Ventricular Tachycardia  Rhythm:Regular Rate:Normal  ECG 12/21/22:  SINUS TACHYCARDIA  LEFT AXIS DEVIATION  ABNORMAL ECG  WHEN COMPARED WITH ECG OF 30-Mar-2020 05:37,  NO SIGNIFICANT CHANGE WAS FOUND   Echo 03/30/20:    1. Technically difficult study.    2. The left ventricle is normal in size with normal wall thickness.    3. The left ventricular systolic function is hyperdynamic, LVEF is visually estimated at > 55%.    4. The right ventricle is normal in size, with normal systolic function.    5. The ascending aorta is mildly dilated.     Neuro/Psych  PSYCHIATRIC DISORDERS (ADHD)      Hx alcohol use disorder, none since 11/2022; hx chewing tobacco use    GI/Hepatic ,GERD  ,,  Endo/Other  negative endocrine ROS    Renal/GU      Musculoskeletal   Abdominal   Peds  Hematology  (+) Blood dyscrasia, anemia   Anesthesia Other Findings   Reproductive/Obstetrics                             Anesthesia Physical Anesthesia Plan  ASA: 3  Anesthesia Plan: General   Post-op Pain Management:    Induction: Intravenous  PONV Risk Score and Plan: 2 and Propofol  infusion, TIVA and Treatment may vary due to age or medical condition  Airway Management Planned: Natural Airway  Additional Equipment:   Intra-op Plan:   Post-operative Plan:   Informed Consent: I have reviewed the  patients History and Physical, chart, labs and discussed the procedure including the risks, benefits and alternatives for the proposed anesthesia with the patient or authorized representative who has indicated his/her understanding and acceptance.       Plan Discussed with: CRNA  Anesthesia Plan Comments: (LMA/GETA backup discussed.  Patient consented for risks of anesthesia including but not limited to:  - adverse reactions to medications - damage to eyes, teeth, lips or other oral mucosa - nerve damage due to positioning  - sore throat or hoarseness - damage to heart, brain, nerves, lungs, other parts of body or loss of life  Informed patient about role of CRNA in peri- and intra-operative care.  Patient voiced understanding.)        Anesthesia Quick Evaluation

## 2023-06-14 ENCOUNTER — Encounter: Payer: Self-pay | Admitting: Gastroenterology

## 2023-06-14 LAB — SURGICAL PATHOLOGY

## 2023-06-18 ENCOUNTER — Telehealth: Payer: Self-pay

## 2023-06-18 NOTE — Telephone Encounter (Signed)
-----   Message from [facility] sent at 06/17/2023 10:30 PM EST ----- Biopsies did not show any eosinophilic esophagitis.  Repeat EGD in 3 months for follow-up of erosive esophagitis, to confirm healing  Rohini Vanga

## 2023-06-18 NOTE — Telephone Encounter (Signed)
Patient verbalized understanding of results put a reminder for 3 months for repeat EGD

## 2023-07-05 ENCOUNTER — Ambulatory Visit: Payer: Managed Care, Other (non HMO) | Admitting: Family Medicine

## 2023-07-05 ENCOUNTER — Inpatient Hospital Stay: Payer: Managed Care, Other (non HMO) | Attending: Oncology | Admitting: Oncology

## 2023-07-05 ENCOUNTER — Encounter: Payer: Self-pay | Admitting: Family Medicine

## 2023-07-05 VITALS — BP 145/95 | HR 102 | Temp 98.3°F | Resp 18 | Wt 205.0 lb

## 2023-07-05 VITALS — BP 122/80 | HR 101 | Resp 16 | Ht 70.0 in | Wt 205.2 lb

## 2023-07-05 DIAGNOSIS — I77810 Thoracic aortic ectasia: Secondary | ICD-10-CM

## 2023-07-05 DIAGNOSIS — R769 Abnormal immunological finding in serum, unspecified: Secondary | ICD-10-CM | POA: Diagnosis not present

## 2023-07-05 DIAGNOSIS — R779 Abnormality of plasma protein, unspecified: Secondary | ICD-10-CM | POA: Diagnosis present

## 2023-07-05 DIAGNOSIS — D649 Anemia, unspecified: Secondary | ICD-10-CM

## 2023-07-05 DIAGNOSIS — E785 Hyperlipidemia, unspecified: Secondary | ICD-10-CM

## 2023-07-05 DIAGNOSIS — I10 Essential (primary) hypertension: Secondary | ICD-10-CM

## 2023-07-05 DIAGNOSIS — D500 Iron deficiency anemia secondary to blood loss (chronic): Secondary | ICD-10-CM

## 2023-07-05 DIAGNOSIS — Z23 Encounter for immunization: Secondary | ICD-10-CM

## 2023-07-05 DIAGNOSIS — Z79899 Other long term (current) drug therapy: Secondary | ICD-10-CM | POA: Insufficient documentation

## 2023-07-05 DIAGNOSIS — D509 Iron deficiency anemia, unspecified: Secondary | ICD-10-CM | POA: Insufficient documentation

## 2023-07-05 DIAGNOSIS — K648 Other hemorrhoids: Secondary | ICD-10-CM | POA: Diagnosis not present

## 2023-07-05 DIAGNOSIS — Z131 Encounter for screening for diabetes mellitus: Secondary | ICD-10-CM

## 2023-07-05 DIAGNOSIS — F901 Attention-deficit hyperactivity disorder, predominantly hyperactive type: Secondary | ICD-10-CM

## 2023-07-05 DIAGNOSIS — K2100 Gastro-esophageal reflux disease with esophagitis, without bleeding: Secondary | ICD-10-CM

## 2023-07-05 DIAGNOSIS — Z8639 Personal history of other endocrine, nutritional and metabolic disease: Secondary | ICD-10-CM

## 2023-07-05 DIAGNOSIS — F1021 Alcohol dependence, in remission: Secondary | ICD-10-CM

## 2023-07-05 DIAGNOSIS — I471 Supraventricular tachycardia, unspecified: Secondary | ICD-10-CM

## 2023-07-05 MED ORDER — OLMESARTAN MEDOXOMIL 40 MG PO TABS: 40.0000 mg | ORAL_TABLET | Freq: Every day | ORAL | 0 refills | Status: DC

## 2023-07-05 MED ORDER — OMEGA-3-ACID ETHYL ESTERS 1 G PO CAPS: 2.0000 g | ORAL_CAPSULE | Freq: Two times a day (BID) | ORAL | 1 refills | Status: DC

## 2023-07-05 MED ORDER — WEGOVY 2.4 MG/0.75ML ~~LOC~~ SOAJ
2.4000 mg | SUBCUTANEOUS | 0 refills | Status: DC
  Filled 2023-07-17: qty 3, 28d supply, fill #0
  Filled 2023-08-10: qty 3, 28d supply, fill #1
  Filled 2023-09-07: qty 3, 28d supply, fill #2

## 2023-07-05 NOTE — Assessment & Plan Note (Signed)
 Lab results from [facility] are reviewed and discussed with patient. Slightly increases kappa light chain, normal light chain ratio, negative M protein on SPEP, negative immunofixation. 24-hour urine protein electrophoresis negative M protein and immunofixation Skeletal survey showed no lytic lesions or osseous lesions. no evidence to suggest active multiple myeloma Not explaining his intermittent hypercalcemia.

## 2023-07-05 NOTE — Progress Notes (Signed)
 Name: Victor Valdez   MRN: [id number]    DOB: [birth year]   Date:07/05/2023       Progress Note  Subjective  Chief Complaint  Chief Complaint  Patient presents with   Medical Management of Chronic Issues   HPI   Hypercalcemia: episodes of syncope associated with hypercalcemia. He has seen neurologist - had a zio patch placed , he also had EEG. He is seeing Endo and  he was found to have abnormal SPEP and seen by hematologist - Dr. Cathie Hoops, who found out he had iron deficiency anemia, he was since than seen by GI and had EGD and colonoscopy  It is still unknown the cause of calcium metabolism disorder   ADD: we stopped Adderal because of episodes of narrow complex tachycardia (SVT) and syncope that happened Nov 2021. He was taking  Strattera , initially felt emotional but it resolved quickly, for a period of time he felt it was helpful but stopped taking medication about 3 months ago. Wife was here with him and states he is coping by drinking caffeine all day and drinking alcohol again at night.   GERD with esophagitis:/internal hemorrhoid: he was taking Nexium, however due to hypercalcemia he was advised by [facility] providers to switch to H2blockers, however recently seen by GI and had EGD and colonoscopy that showed mild hiatal hernia and esophagitis and also internal hemorrhoids and was advised to resume PPI BID  Elevated liver enzymes/Alcoholism with recent relapse he stopped drinking during [facility] hospital stay back in Nov 2021, he had a short relapse in August 22  and again in early 2024,he was alcohol from July 3 rd 2024 until a few months ago and is drinking again . There is a family history of alcoholism - sister and father . He is seeing a therapist but only once a month, discussed more regular follow ups   History of Obesity: he is on Wegovy , 2.4 dose since Dec and is tolerating it well, weight is trending down . He is taking medication for maintenance now    OSA on CPAP Spring 2024 , unchanged    HTN:he is on beta-blocker and Benicar, tolerating medication well. No chest pain or dizziness lately    Insomnia: he has tried  Melatonin,  Temazepam and Quiviqui. He is currently taking Trazodone given while at [facility] for near syncope   History of syncope and SVT: seeing cardiologist, on Atenolol and episodes of palpitation improved   Anxiety/dysthymia : he was feeling  jittery and anxious during the Summer 22 and we started him on Lexapro. His wife noticed he was not his normal self, sleeping more than usual, lack of motivation , he went to see a psychiatrist in [place] and medication was switched from Lexapro to Wellbutrin and he was doing well, wellbutrin has been discontinued after hospital stay in August due to decrease in seizure threshold.He is only having therapy at this time. He is going to try to doing pottery with his wife as a hobby    Mild dilation of ascending aorta: found on Echo done in 2021 at [facility],  last Echo done 08/2021 - we will send him back to cardiologist    Dyslipidemia: we will recheck labs     Patient Active Problem List   Diagnosis Date Noted   Erosive esophagitis 06/13/2023   Chronic GERD 06/13/2023   Normocytic anemia 02/08/2023   Abnormal serum immunoelectrophoresis 01/09/2023   Iron deficiency anemia 12/26/2021   Gastroesophageal reflux disease without esophagitis 12/26/2021  Obesity (BMI 30.0-34.9) 12/26/2021   Snoring 12/26/2021   Stage 3a chronic kidney disease (HCC) 12/26/2021   Mild dilation of ascending aorta (HCC) 04/04/2020   Hepatic steatosis 04/04/2020   Low serum parathyroid hormone (PTH) 04/04/2020   Hypomagnesemia 04/04/2020   Hypercalcemia 04/04/2020   Narrow complex tachycardia (HCC) 03/30/2020   Internal hemorrhoids 10/23/2016   Diverticulosis 10/23/2016   ADD (attention deficit disorder) 11/13/2014   Basal cell carcinoma of face 11/13/2014   CD (contact dermatitis) 11/13/2014   Dyslipidemia 11/13/2014   Blood glucose elevated  11/13/2014   Benign hypertension 11/13/2014   Perennial allergic rhinitis with seasonal variation 11/13/2014   Allergic rhinitis 11/13/2014    Past Surgical History:  Procedure Laterality Date   COLONOSCOPY WITH PROPOFOL N/A 10/08/2016   Procedure: COLONOSCOPY WITH PROPOFOL;  Surgeon: Christena Deem, MD;  Location: [facility] ENDOSCOPY;  Service: Endoscopy;  Laterality: N/A;   COLONOSCOPY WITH PROPOFOL N/A 06/13/2023   Procedure: COLONOSCOPY WITH PROPOFOL;  Surgeon: Toney Reil, MD;  Location: [facility] ENDOSCOPY;  Service: Gastroenterology;  Laterality: N/A;   ESOPHAGOGASTRODUODENOSCOPY (EGD) WITH PROPOFOL N/A 06/13/2023   Procedure: ESOPHAGOGASTRODUODENOSCOPY (EGD) WITH PROPOFOL;  Surgeon: Toney Reil, MD;  Location: [facility] ENDOSCOPY;  Service: Gastroenterology;  Laterality: N/A;   EYE SURGERY Bilateral    lasik   LASIK Bilateral 12.30.2015    Family History  Problem Relation Age of Onset   Hypertension Mother    Cancer Father 57       prostate cancer   Hypertension Sister    Obesity Sister     Social History   Tobacco Use   Smoking status: Never   Smokeless tobacco: Former  Substance Use Topics   Alcohol use: Not Currently    Comment: States stopped on 10/05/2020 Had a slipped up and drank one day on 11/07/2022     Current Outpatient Medications:    atenolol (TENORMIN) 25 MG tablet, Take 1 tablet (25 mg total) by mouth every evening., Disp: 90 tablet, Rfl: 1   atomoxetine (STRATTERA) 40 MG capsule, Take 1 capsule (40 mg total) by mouth daily., Disp: 90 capsule, Rfl: 1   Clindamycin Phosphate foam, Apply to affected areas scalp, chest, back once to twice daily as needed., Disp: 100 g, Rfl: 5   metroNIDAZOLE (METROGEL) 0.75 % gel, Apply to face every night for rosacea., Disp: 45 g, Rfl: 5   olmesartan (BENICAR) 40 MG tablet, TAKE 1 TABLET BY MOUTH DAILY, Disp: 90 tablet, Rfl: 0   omega-3 acid ethyl esters (LOVAZA) 1 g capsule, Take 2 capsules (2 g total) by mouth 2 (two)  times daily., Disp: 360 capsule, Rfl: 1   Semaglutide-Weight Management (WEGOVY) 2.4 MG/0.75ML SOAJ, Inject 2.4 mg into the skin once a week., Disp: 9 mL, Rfl: 0   tadalafil (CIALIS) 20 MG tablet, Take 1 tablet (20 mg total) by mouth every other day as needed for erectile dysfunction., Disp: 30 tablet, Rfl: 1   topiramate (TOPAMAX) 50 MG tablet, Take 1 tablet by mouth at bedtime., Disp: , Rfl:    traZODone (DESYREL) 100 MG tablet, Take 1 tablet (100 mg total) by mouth at bedtime., Disp: 90 tablet, Rfl: 2   omeprazole (PRILOSEC) 40 MG capsule, Take 1 capsule (40 mg total) by mouth 2 (two) times daily before a meal. (Patient not taking: Reported on 07/05/2023), Disp: 180 capsule, Rfl: 1  Allergies  Allergen Reactions   Aspirin Anaphylaxis   Nsaids    Penicillins     I personally reviewed active problem  list, medication list, allergies, family history with the patient/caregiver today.   ROS  Ten systems reviewed and is negative except as mentioned in HPI    Objective  Vitals:   07/05/23 0845 07/05/23 0847  BP: 122/80   Pulse: (!) 111 (!) 101  Resp: 16   SpO2: 96%   Weight: 205 lb 3.2 oz (93.1 kg)   Height: 5\' 10"  (1.778 m)     Body mass index is 29.44 kg/m.  Physical Exam  Constitutional: Patient appears well-developed and well-nourished.  No distress.  HEENT: head atraumatic, normocephalic, pupils equal and reactive to light, neck supple Cardiovascular: Normal rate, regular rhythm and normal heart sounds.  No murmur heard. No BLE edema. Pulmonary/Chest: Effort normal and breath sounds normal. No respiratory distress. Abdominal: Soft.  There is no tenderness. Psychiatric: Patient has a normal mood and affect. behavior is normal. Judgment and thought content normal.   Recent Results (from the past 2160 hours)  Surgical pathology     Status: None   Collection Time: 06/13/23 12:00 AM  Result Value Ref Range   SURGICAL PATHOLOGY      SURGICAL PATHOLOGY [facility] [address] [place], [place] [address] Telephone [phone] or [phone] Fax [phone]  REPORT OF SURGICAL PATHOLOGY   Accession #: [id number] Patient Name: DERREN, SUYDAM Visit # : [id number]  MRN: [id number] Physician: Lannette Donath DOB/Age [birth year] (Age: 58) Gender: M Collected Date: 06/13/2023 Received Date: 06/13/2023  FINAL DIAGNOSIS       1. Esophagus, biopsy, cbx :       SQUAMOUS MUCOSA WITH MILD REFLUX CHANGES.      NEGATIVE FOR EOSINOPHILIC ESOPHAGITIS.       DATE SIGNED OUT: 06/14/2023 ELECTRONIC SIGNATURE : Jacelyn Grip, Jionni, Pathologist, Electronic Signature  MICROSCOPIC DESCRIPTION  CASE COMMENTS STAINS USED IN DIAGNOSIS: H&E    CLINICAL HISTORY  SPECIMEN(S) OBTAINED 1. Esophagus, biopsy, Cbx  SPECIMEN COMMENTS: SPECIMEN CLINICAL INFORMATION: 1. Colonscopy IDA. EGD chronic GERD. Erosive esophagitis. Hiatal hernia, diverticulosis    Gross Description  1. "Cbx esophagus R/O eoe".  Received in formalin is a 1.0 x 0.8 x 0.1 cm aggregate of multiple soft, tan-gray tissue fragments submitted entirely in block 1A. (SB:kh 06/13/23)        Report signed out from the following location(s) [facility]  [facility] [address] [place], [place] [address] CLIA #: [id number]  [facility] [address] [place], [place] [address] CLIA #: [id number]     Diabetic Foot Exam:     PHQ2/9:    07/05/2023    8:44 AM 04/09/2023   10:25 AM 01/18/2023    9:50 AM 01/08/2023    8:51 AM 12/25/2022   11:21 AM  Depression screen PHQ 2/9  Decreased Interest 0 0 0 0 0  Down, Depressed, Hopeless 0 0 0 0 0  PHQ - 2 Score 0 0 0 0 0  Altered sleeping 0 0  0 0  Tired, decreased energy 0 0  0 0  Change in appetite 0 0  0 0  Feeling bad or failure about yourself  0 0  0 0  Trouble concentrating 0 0  0 1  Moving slowly or fidgety/restless 0 0  0 0  Suicidal thoughts 0 0  0 0  PHQ-9 Score 0 0  0 1   Difficult doing work/chores Not difficult at all Not difficult at all  Not difficult at all Not difficult at all  phq 9 is negative  Fall Risk:    04/09/2023   10:17 AM 01/08/2023    8:50 AM 12/25/2022   11:20 AM 10/08/2022    8:02 AM 04/27/2022    7:51 AM  Fall Risk   Falls in the past year? 1 1 1  0 0  Number falls in past yr: 0 1 1  0  Injury with Fall? 0 0 0  0  Risk for fall due to : Impaired balance/gait  History of fall(s) No Fall Risks No Fall Risks  Follow up Falls prevention discussed;Education provided;Falls evaluation completed  Falls evaluation completed;Falls prevention discussed;Education provided Falls prevention discussed Falls prevention discussed    Assessment and Plan:   1. Mild dilation of ascending aorta (HCC) (Primary)  - Ambulatory referral to Cardiology  2. SVT (supraventricular tachycardia) (HCC)  - Ambulatory referral to Cardiology  3. History of alcoholism (HCC)  He is having a relapse, states drinking again after work but not as much - did not quantify.   4. Internal hemorrhoids without complication  Found on recent colonoscopy   5. Gastroesophageal reflux disease with esophagitis without hemorrhage  Taking PPI BID since recent EGD and reflux symptoms are controlled, likely the cause of iron deficiency anemia  6. Attention deficit hyperactivity disorder (ADHD), predominantly hyperactive type  He stopped Strattera due to no longer controlling symptoms. Explained we could have adjusted his dose. He has a history of SVT and not a candidate for stimulants. Discussed a weekly therapist visit and executive functional coach to help him with work tasks and organizational skills.   7. Diabetes mellitus screening  - Hemoglobin A1c  8. Need for hepatitis A vaccination  - Hepatitis A vaccine adult IM  9. Benign hypertension  - Comprehensive metabolic panel - olmesartan (BENICAR) 40 MG tablet; Take 1 tablet (40 mg total) by mouth daily.  Dispense:  90 tablet; Refill: 0  10. History of obesity  - Semaglutide-Weight Management (WEGOVY) 2.4 MG/0.75ML SOAJ; Inject 2.4 mg into the skin once a week.  Dispense: 9 mL; Refill: 0  11. Calcium metabolism disorder  Keep visit with Endo  12. Iron deficiency anemia due to chronic blood loss  Taking ferrous sulfate  13. Dyslipidemia  - Lipid panel - omega-3 acid ethyl esters (LOVAZA) 1 g capsule; Take 2 capsules (2 g total) by mouth 2 (two) times daily.  Dispense: 360 capsule; Refill: 1  14. Need for pneumococcal 20-valent conjugate vaccination  - Pneumococcal conjugate vaccine 20-valent (Prevnar 20)

## 2023-07-06 ENCOUNTER — Encounter: Payer: Self-pay | Admitting: Oncology

## 2023-07-06 NOTE — Assessment & Plan Note (Addendum)
 Continue Vitron C  Check iron tibc ferritin cbc

## 2023-07-06 NOTE — Progress Notes (Signed)
 Hematology/Oncology Consult note [facility] 2 [phone] [phone]   Patient Care Team: Alba Cory, MD as PCP - General (Family Medicine) Debbe Odea, MD as PCP - Cardiology (Cardiology) Deirdre Evener, MD as Consulting Physician (Dermatology) Rickard Patience, MD as Consulting Physician (Oncology)   CHIEF COMPLAINTS/REASON FOR VISIT:  Iron deficiency anemia.   ASSESSMENT & PLAN:   Abnormal serum immunoelectrophoresis Lab results from [facility] are reviewed and discussed with patient. Slightly increases kappa light chain, normal light chain ratio, negative M protein on SPEP, negative immunofixation. 24-hour urine protein electrophoresis negative M protein and immunofixation Skeletal survey showed no lytic lesions or osseous lesions. no evidence to suggest active multiple myeloma Not explaining his intermittent hypercalcemia.   Iron deficiency anemia Continue Vitron C  Check iron tibc ferritin cbc  [facility] prescription provided to patient.  Follow-up as needed.  All questions were answered. The patient knows to call the clinic with any problems, questions or concerns.  Rickard Patience, MD, PhD [facility] Hematology Oncology 07/05/2023    HISTORY OF PRESENTING ILLNESS:  Victor Valdez is a 58 y.o. male who was seen in consultation at the request of Alba Cory, MD for evaluation of abnormal SPEP results.   Patient sees endocrinology for hypercalcemia.  12/21/22 hospitalization at [facility] due to syncope and found to have severe hypercalcemia with AKI , PTH was low at 8.1, 1,25 OH Vit D <8, He reports a similar episode with hospitalization at [facility] in 2021 with calcium of 12.1 with PTH of 10 hypercalcemia was attributed to dehydration and to HCTZ   12/24/2022 SPEP showed a slight irregularity in the gamma region, which may represent monoclonal protein  24 hour UPEP showed mild selective glomerular proteinuria with excretion of small  amounts of albumin and trace amounts of alpha and beta globulins. The urine protein pattern demonstrates the presence of a faint suspicious band of restricted mobility which may be indicative of a monoclonal protein  Free kappa light chain 25.1, lamda light chain 26.2, ratio 0.96  He denies history of abnormal bone pain or bone fracture. Denies chills, night sweats, anorexia or abnormal weight loss.  He presents for further evaluation of abnormal SPEP and UPEP.  Denies weight loss, fever, chills,night sweats.  Not currently taking Vitamin D, calcium, lithium or hydrochlorothiazide.  He was on weight loss medication.  He used to drink alcohol, quitted now.  He has ongoing work up for seizure. Family history: father with prostate cancer, grandparents with cancer, no details known.  Last colonoscopy in 2018   INTERVAL HISTORY Victor Valdez is a 58 y.o. male who has above history reviewed by me today presents for follow up visit for iron deficiency anemia, abnormal SPEP He has no new concerns. He takes Vitron C  06/12/2024 EGD showed reflux esophagitis, he takes PPI   MEDICAL HISTORY:  Past Medical History:  Diagnosis Date   Adult attention deficit disorder    Allergy    Basal cell carcinoma    R shoulder per pt    Basal cell epithelioma, face    Dr. Enid Cutter, R eye   Dysplastic nevus 04/20/2019   Left superior buttocks/posterior waistline. Mild to moderate atypia, margins free.   Dysplastic nevus 10/18/2014   Right mid back, moderate   Hyperglycemia    Hyperlipidemia    Hypertension    Sleep apnea    Snoring     SURGICAL HISTORY: Past Surgical History:  Procedure Laterality Date   COLONOSCOPY WITH PROPOFOL N/A 10/08/2016  Procedure: COLONOSCOPY WITH PROPOFOL;  Surgeon: Christena Deem, MD;  Location: [facility] ENDOSCOPY;  Service: Endoscopy;  Laterality: N/A;   COLONOSCOPY WITH PROPOFOL N/A 06/13/2023   Procedure: COLONOSCOPY WITH PROPOFOL;  Surgeon: Toney Reil, MD;   Location: [facility] ENDOSCOPY;  Service: Gastroenterology;  Laterality: N/A;   ESOPHAGOGASTRODUODENOSCOPY (EGD) WITH PROPOFOL N/A 06/13/2023   Procedure: ESOPHAGOGASTRODUODENOSCOPY (EGD) WITH PROPOFOL;  Surgeon: Toney Reil, MD;  Location: [facility] ENDOSCOPY;  Service: Gastroenterology;  Laterality: N/A;   EYE SURGERY Bilateral    lasik   LASIK Bilateral 12.30.2015    SOCIAL HISTORY: Social History   Socioeconomic History   Marital status: Married    Spouse name: Nicholos Johns   Number of children: 2   Years of education: Not on file   Highest education level: Master's degree (e.g., MA, MS, MEng, MEd, MSW, MBA)  Occupational History   Not on file  Tobacco Use   Smoking status: Never   Smokeless tobacco: Former  Building services engineer status: Never Used  Substance and Sexual Activity   Alcohol use: Not Currently    Comment: States stopped on 10/05/2020 Had a slipped up and drank one day on 11/07/2022   Drug use: No   Sexual activity: Yes    Partners: Female    Birth control/protection: None  Other Topics Concern   Not on file  Social History Narrative   Not on file   Social Drivers of Health   Financial Resource Strain: Low Risk  (01/02/2023)   Received from [facility]   Overall Financial Resource Strain (CARDIA)    Difficulty of Paying Living Expenses: Not hard at all  Food Insecurity: No Food Insecurity (01/18/2023)   Hunger Vital Sign    Worried About Running Out of Food in the Last Year: Never true    Ran Out of Food in the Last Year: Never true  Transportation Needs: No Transportation Needs (01/18/2023)   PRAPARE - Administrator, Civil Service (Medical): No    Lack of Transportation (Non-Medical): No  Physical Activity: Sufficiently Active (06/13/2020)   Exercise Vital Sign    Days of Exercise per Week: 7 days    Minutes of Exercise per Session: 50 min  Stress: No Stress Concern Present (06/13/2020)   [employer] of Occupational Health -  Occupational Stress Questionnaire    Feeling of Stress : Only a little  Social Connections: Socially Integrated (06/13/2020)   Social Connection and Isolation Panel [NHANES]    Frequency of Communication with Friends and Family: More than three times a week    Frequency of Social Gatherings with Friends and Family: More than three times a week    Attends Religious Services: More than 4 times per year    Active Member of [employer] or Organizations: Yes    Attends Engineer, structural: More than 4 times per year    Marital Status: Married  Catering manager Violence: Not At Risk (01/18/2023)   Humiliation, Afraid, Rape, and Kick questionnaire    Fear of Current or Ex-Partner: No    Emotionally Abused: No    Physically Abused: No    Sexually Abused: No    FAMILY HISTORY: Family History  Problem Relation Age of Onset   Hypertension Mother    Cancer Father 59       prostate cancer   Hypertension Sister    Obesity Sister     ALLERGIES:  is allergic to aspirin, nsaids, and penicillins.  MEDICATIONS:  Current Outpatient Medications  Medication Sig Dispense Refill   atenolol (TENORMIN) 25 MG tablet Take 1 tablet (25 mg total) by mouth every evening. 90 tablet 1   Clindamycin Phosphate foam Apply to affected areas scalp, chest, back once to twice daily as needed. 100 g 5   ferrous sulfate 325 (65 FE) MG tablet Take 325 mg by mouth daily with breakfast.     metroNIDAZOLE (METROGEL) 0.75 % gel Apply to face every night for rosacea. 45 g 5   olmesartan (BENICAR) 40 MG tablet Take 1 tablet (40 mg total) by mouth daily. 90 tablet 0   omega-3 acid ethyl esters (LOVAZA) 1 g capsule Take 2 capsules (2 g total) by mouth 2 (two) times daily. 360 capsule 1   omeprazole (PRILOSEC) 40 MG capsule Take 1 capsule (40 mg total) by mouth 2 (two) times daily before a meal. 180 capsule 1   Semaglutide-Weight Management (WEGOVY) 2.4 MG/0.75ML SOAJ Inject 2.4 mg into the skin once a week. 9 mL 0    tadalafil (CIALIS) 20 MG tablet Take 1 tablet (20 mg total) by mouth every other day as needed for erectile dysfunction. 30 tablet 1   topiramate (TOPAMAX) 50 MG tablet Take 1 tablet by mouth at bedtime.     traZODone (DESYREL) 100 MG tablet Take 1 tablet (100 mg total) by mouth at bedtime. 90 tablet 2   No current facility-administered medications for this visit.    Review of Systems  Constitutional:  Negative for appetite change, chills, fatigue, fever and unexpected weight change.  HENT:   Negative for hearing loss and voice change.   Eyes:  Negative for eye problems and icterus.  Respiratory:  Negative for chest tightness, cough and shortness of breath.   Cardiovascular:  Negative for chest pain and leg swelling.  Gastrointestinal:  Negative for abdominal distention and abdominal pain.  Endocrine: Negative for hot flashes.  Genitourinary:  Negative for difficulty urinating, dysuria and frequency.   Musculoskeletal:  Negative for arthralgias.  Skin:  Negative for itching and rash.  Neurological:  Negative for light-headedness and numbness.  Hematological:  Negative for adenopathy. Does not bruise/bleed easily.  Psychiatric/Behavioral:  Negative for confusion and depression.    PHYSICAL EXAMINATION: ECOG PERFORMANCE STATUS: 1 - Symptomatic but completely ambulatory Vitals:   07/05/23 1200 07/05/23 1201  BP: (!) 143/98 (!) 145/95  Pulse: (!) 104 (!) 102  Resp: 18   Temp: 98.3 F (36.8 C)    Filed Weights   07/05/23 1200  Weight: 205 lb (93 kg)    Physical Exam Constitutional:      General: He is not in acute distress. HENT:     Head: Normocephalic and atraumatic.  Eyes:     General: No scleral icterus. Cardiovascular:     Rate and Rhythm: Normal rate.  Pulmonary:     Effort: Pulmonary effort is normal. No respiratory distress.  Abdominal:     General: There is no distension.  Musculoskeletal:        General: Normal range of motion.     Cervical back: Normal range  of motion and neck supple.  Skin:    Findings: No erythema or rash.  Neurological:     Mental Status: He is alert and oriented to person, place, and time. Mental status is at baseline.  Psychiatric:        Mood and Affect: Mood normal.      LABORATORY DATA:  I have reviewed the data as listed  Latest Ref Rng & Units 12/21/2021    2:54 PM 07/15/2018    7:59 AM 01/16/2017    3:33 PM  CBC  WBC 3.4 - 10.8 x10E3/uL 6.1  4.9  6.7   Hemoglobin 13.0 - 17.7 g/dL 09.8  11.9  14.7   Hematocrit 37.5 - 51.0 % 35.2  41.7  39.5   Platelets 150 - 450 x10E3/uL 238  184  274       Latest Ref Rng & Units 12/25/2022    1:03 PM 12/21/2021    2:54 PM 12/23/2020    8:52 AM  CMP  Glucose 70 - 99 mg/dL 95  829  97   BUN 6 - 24 mg/dL 20  20  27    Creatinine 0.76 - 1.27 mg/dL 5.62  1.30  8.65   Sodium 134 - 144 mmol/L 135  140  141   Potassium 3.5 - 5.2 mmol/L 4.0  4.7  3.9   Chloride 96 - 106 mmol/L 99  102  100   CO2 20 - 29 mmol/L 23  23  27    Calcium 8.7 - 10.2 mg/dL 8.3  9.8  78.4   Total Protein 6.0 - 8.5 g/dL  6.5  6.8   Total Bilirubin 0.0 - 1.2 mg/dL  0.3  0.6   Alkaline Phos 44 - 121 IU/L  78  100   AST 0 - 40 IU/L  19  177   ALT 0 - 44 IU/L  16  258             RADIOGRAPHIC STUDIES: I have personally reviewed the radiological images as listed and agreed with the findings in the report.  No results found.

## 2023-07-06 NOTE — Assessment & Plan Note (Signed)
Hemoglobin has trended down to 10.2.  MCV 82. Went to check iron TIBC ferritin.  He has a history of iron deficiency anemia

## 2023-07-09 ENCOUNTER — Ambulatory Visit: Payer: 59 | Admitting: Internal Medicine

## 2023-07-15 ENCOUNTER — Other Ambulatory Visit: Payer: Self-pay | Admitting: Family Medicine

## 2023-07-15 DIAGNOSIS — Z8639 Personal history of other endocrine, nutritional and metabolic disease: Secondary | ICD-10-CM

## 2023-07-17 ENCOUNTER — Encounter: Payer: Self-pay | Admitting: Oncology

## 2023-07-17 ENCOUNTER — Other Ambulatory Visit: Payer: Self-pay

## 2023-07-22 ENCOUNTER — Other Ambulatory Visit: Payer: Self-pay | Admitting: Family Medicine

## 2023-07-22 DIAGNOSIS — K219 Gastro-esophageal reflux disease without esophagitis: Secondary | ICD-10-CM

## 2023-07-22 DIAGNOSIS — F5101 Primary insomnia: Secondary | ICD-10-CM

## 2023-08-05 ENCOUNTER — Ambulatory Visit: Payer: Managed Care, Other (non HMO) | Attending: Cardiology | Admitting: Cardiology

## 2023-08-19 ENCOUNTER — Telehealth: Payer: Self-pay

## 2023-08-19 NOTE — Telephone Encounter (Signed)
 Called and left a message for call back

## 2023-08-19 NOTE — Telephone Encounter (Signed)
-----   Message from [facility] Tonsina H sent at 06/18/2023  8:54 AM EST ----- Repeat EGD in 3 months for follow-up of erosive esophagitis

## 2023-08-20 NOTE — Telephone Encounter (Signed)
 Called and left a message for call back

## 2023-08-21 NOTE — Telephone Encounter (Signed)
 Called and left a message for call back, sent mychart message and letter to patient. Unable to reach patient.

## 2023-10-04 ENCOUNTER — Ambulatory Visit: Payer: Managed Care, Other (non HMO) | Admitting: Family Medicine

## 2023-10-15 ENCOUNTER — Other Ambulatory Visit: Payer: Self-pay | Admitting: Family Medicine

## 2023-10-15 DIAGNOSIS — I471 Supraventricular tachycardia, unspecified: Secondary | ICD-10-CM

## 2023-10-15 DIAGNOSIS — I10 Essential (primary) hypertension: Secondary | ICD-10-CM

## 2023-10-30 ENCOUNTER — Other Ambulatory Visit: Payer: Self-pay | Admitting: Family Medicine

## 2023-10-30 DIAGNOSIS — I10 Essential (primary) hypertension: Secondary | ICD-10-CM

## 2023-12-04 ENCOUNTER — Encounter: Payer: Self-pay | Admitting: Family Medicine

## 2023-12-04 ENCOUNTER — Other Ambulatory Visit (HOSPITAL_COMMUNITY): Payer: Self-pay

## 2023-12-04 ENCOUNTER — Ambulatory Visit: Admitting: Family Medicine

## 2023-12-04 VITALS — BP 144/92 | HR 84 | Resp 16 | Ht 70.0 in | Wt 220.8 lb

## 2023-12-04 DIAGNOSIS — I10 Essential (primary) hypertension: Secondary | ICD-10-CM

## 2023-12-04 DIAGNOSIS — E785 Hyperlipidemia, unspecified: Secondary | ICD-10-CM

## 2023-12-04 DIAGNOSIS — I77810 Thoracic aortic ectasia: Secondary | ICD-10-CM

## 2023-12-04 DIAGNOSIS — F901 Attention-deficit hyperactivity disorder, predominantly hyperactive type: Secondary | ICD-10-CM

## 2023-12-04 DIAGNOSIS — I471 Supraventricular tachycardia, unspecified: Secondary | ICD-10-CM

## 2023-12-04 DIAGNOSIS — E66811 Obesity, class 1: Secondary | ICD-10-CM

## 2023-12-04 DIAGNOSIS — F1021 Alcohol dependence, in remission: Secondary | ICD-10-CM

## 2023-12-04 DIAGNOSIS — I739 Peripheral vascular disease, unspecified: Secondary | ICD-10-CM | POA: Diagnosis not present

## 2023-12-04 DIAGNOSIS — N1831 Chronic kidney disease, stage 3a: Secondary | ICD-10-CM

## 2023-12-04 DIAGNOSIS — K219 Gastro-esophageal reflux disease without esophagitis: Secondary | ICD-10-CM

## 2023-12-04 DIAGNOSIS — G5793 Unspecified mononeuropathy of bilateral lower limbs: Secondary | ICD-10-CM | POA: Insufficient documentation

## 2023-12-04 DIAGNOSIS — F5101 Primary insomnia: Secondary | ICD-10-CM | POA: Insufficient documentation

## 2023-12-04 MED ORDER — ATENOLOL 25 MG PO TABS: 25.0000 mg | ORAL_TABLET | Freq: Every evening | ORAL | 1 refills | Status: DC

## 2023-12-04 MED ORDER — DULOXETINE HCL 30 MG PO CPEP: 30.0000 mg | ORAL_CAPSULE | Freq: Every day | ORAL | 0 refills | Status: DC

## 2023-12-04 MED ORDER — TRAZODONE HCL 100 MG PO TABS: 100.0000 mg | ORAL_TABLET | Freq: Every day | ORAL | 1 refills | Status: DC

## 2023-12-04 MED ORDER — WEGOVY 0.25 MG/0.5ML ~~LOC~~ SOAJ
0.2500 mg | SUBCUTANEOUS | 0 refills | Status: DC
  Filled 2023-12-11: qty 2, 28d supply, fill #0

## 2023-12-04 MED ORDER — OLMESARTAN MEDOXOMIL 40 MG PO TABS: 40.0000 mg | ORAL_TABLET | Freq: Every day | ORAL | 1 refills | Status: DC

## 2023-12-04 MED ORDER — OMEGA-3-ACID ETHYL ESTERS 1 G PO CAPS: 2.0000 g | ORAL_CAPSULE | Freq: Two times a day (BID) | ORAL | 1 refills | Status: AC

## 2023-12-04 NOTE — Progress Notes (Signed)
 Name: Victor Valdez   MRN: [id number]    DOB: [birth year]   Date:12/04/2023       Progress Note  Subjective  Chief Complaint  Chief Complaint  Patient presents with   Medical Management of Chronic Issues    Pt states normally on track with taking medications on a daily but has been busy within the last month and has not been consistent.  Possibly interested in a antidepressant   Discussed the use of AI scribe software for clinical note transcription with the patient, who gave verbal consent to proceed.  History of Present Illness Victor Valdez is a 58 year old male with hypertension and dyslipidemia who presents for a regular follow-up visit.  He attributes recent elevated blood pressure readings to missing doses of his medication and increased stress. He is currently taking atenolol  25 mg every evening and olmesartan  in the morning. Blood pressure readings vary, sometimes being normal and other times elevated.   He is taking Lovaza  for high triglycerides but is not on a statin. His last cholesterol check in 2023 showed an LDL level of 142. He is not allergic to statins and has normalized liver enzymes, which were previously elevated. MRI brain showed small vessel disease  He has supraventricular tachycardia (SVT) but has experienced no recent palpitations or shortness of breath. He had a single episode of convulsive seizure in the past, attributed to lack of sleep, stress, and travel and at the time had electrolyte disturbance with hypercalcemia . An EEG showed no seizure activity, and he continues on topiramate as recommended by neurologist   He had hypercalcemia, which he believes was due to excessive Tums intake for heartburn. Since starting omeprazole , he has not experienced heartburn and has stopped taking Tums.  He has obstructive sleep apnea and uses a CPAP machine, which has improved his restfulness. He also has a mild dilation of the ascending aorta noted in 2021 and 2023.  He  feels depressed and disinterested in activities, attributing this to stress from work and family issues. He has not been taking any medication for depression since stopping Wellbutrin  and Lexapro , Wellbutrin  no longer a good option due to decrease in seizure threshold . He previously attended virtual psychiatry sessions but has not done so since March.  He has gained 15 pounds after running out of weight loss medication and reports a decrease in physical activity due to recent life events. He was previously walking and running regularly and eating smaller portions while on medication. He states due to stress ( mother in law has pancreatic cancer ) had to move into [employer] due to installing hardwood floors at their home, and increase stress at work. He moved back to his house one week ago and has been eating healthier again, and is going to resume walking today.     Patient Active Problem List   Diagnosis Date Noted   Small vessel disease (HCC) 12/04/2023   Primary insomnia 12/04/2023   Neuropathic pain of both legs 12/04/2023   Gastroesophageal reflux disease with esophagitis without hemorrhage 07/05/2023   SVT (supraventricular tachycardia) (HCC) 07/05/2023   Alcoholism in remission (HCC) 07/05/2023   Erosive esophagitis 06/13/2023   Chronic GERD 06/13/2023   Abnormal serum immunoelectrophoresis 01/09/2023   Iron deficiency anemia 12/26/2021   Gastroesophageal reflux disease without esophagitis 12/26/2021   Obesity (BMI 30.0-34.9) 12/26/2021   Snoring 12/26/2021   Stage 3a chronic kidney disease (HCC) 12/26/2021   Mild dilation of ascending aorta (HCC) 04/04/2020   Hepatic steatosis  04/04/2020   Low serum parathyroid hormone (PTH) 04/04/2020   Hypomagnesemia 04/04/2020   Calcium metabolism disorder 04/04/2020   Narrow complex tachycardia (HCC) 03/30/2020   Internal hemorrhoids without complication 10/23/2016   Diverticulosis 10/23/2016   ADD (attention deficit disorder) 11/13/2014    Basal cell carcinoma of face 11/13/2014   CD (contact dermatitis) 11/13/2014   Dyslipidemia 11/13/2014   Blood glucose elevated 11/13/2014   Benign hypertension 11/13/2014   Perennial allergic rhinitis with seasonal variation 11/13/2014   Allergic rhinitis 11/13/2014    Past Surgical History:  Procedure Laterality Date   COLONOSCOPY WITH PROPOFOL  N/A 10/08/2016   Procedure: COLONOSCOPY WITH PROPOFOL ;  Surgeon: Gaylyn Gladis PENNER, MD;  Location: [facility] ENDOSCOPY;  Service: Endoscopy;  Laterality: N/A;   COLONOSCOPY WITH PROPOFOL  N/A 06/13/2023   Procedure: COLONOSCOPY WITH PROPOFOL ;  Surgeon: Unk Corinn Skiff, MD;  Location: [facility] ENDOSCOPY;  Service: Gastroenterology;  Laterality: N/A;   ESOPHAGOGASTRODUODENOSCOPY (EGD) WITH PROPOFOL  N/A 06/13/2023   Procedure: ESOPHAGOGASTRODUODENOSCOPY (EGD) WITH PROPOFOL ;  Surgeon: Unk Corinn Skiff, MD;  Location: [facility] ENDOSCOPY;  Service: Gastroenterology;  Laterality: N/A;   EYE SURGERY Bilateral    lasik   LASIK Bilateral 12.30.2015    Family History  Problem Relation Age of Onset   Hypertension Mother    Cancer Father 27       prostate cancer   Hypertension Sister    Obesity Sister     Social History   Tobacco Use   Smoking status: Never   Smokeless tobacco: Former  Substance Use Topics   Alcohol use: Not Currently    Comment: States stopped on 10/05/2020 Had a slipped up and drank one day on 11/07/2022     Current Outpatient Medications:    Clindamycin  Phosphate foam, Apply to affected areas scalp, chest, back once to twice daily as needed., Disp: 100 g, Rfl: 5   DULoxetine  (CYMBALTA ) 30 MG capsule, Take 1-2 capsules (30-60 mg total) by mouth daily. 30  mg first week after that two daily, Disp: 60 capsule, Rfl: 0   ferrous sulfate 325 (65 FE) MG tablet, Take 325 mg by mouth daily with breakfast., Disp: , Rfl:    metroNIDAZOLE  (METROGEL ) 0.75 % gel, Apply to face every night for rosacea., Disp: 45 g, Rfl: 5   omeprazole  (PRILOSEC) 40  MG capsule, Take 1 capsule (40 mg total) by mouth 2 (two) times daily before a meal., Disp: 180 capsule, Rfl: 1   Semaglutide -Weight Management (WEGOVY ) 0.25 MG/0.5ML SOAJ, Inject 0.25 mg into the skin once a week., Disp: 2 mL, Rfl: 0   topiramate (TOPAMAX) 50 MG tablet, Take 1 tablet by mouth at bedtime., Disp: , Rfl:    atenolol  (TENORMIN ) 25 MG tablet, Take 1 tablet (25 mg total) by mouth every evening., Disp: 90 tablet, Rfl: 1   olmesartan  (BENICAR ) 40 MG tablet, Take 1 tablet (40 mg total) by mouth daily., Disp: 90 tablet, Rfl: 1   omega-3 acid ethyl esters (LOVAZA ) 1 g capsule, Take 2 capsules (2 g total) by mouth 2 (two) times daily., Disp: 360 capsule, Rfl: 1   tadalafil  (CIALIS ) 20 MG tablet, Take 1 tablet (20 mg total) by mouth every other day as needed for erectile dysfunction. (Patient not taking: Reported on 12/04/2023), Disp: 30 tablet, Rfl: 1   traZODone  (DESYREL ) 100 MG tablet, Take 1 tablet (100 mg total) by mouth at bedtime., Disp: 90 tablet, Rfl: 1  Allergies  Allergen Reactions   Aspirin Anaphylaxis   Nsaids    Penicillins  I personally reviewed active problem list, medication list, allergies with the patient/caregiver today.   ROS  Ten systems reviewed and is negative except as mentioned in HPI    Objective Physical Exam  CONSTITUTIONAL: Patient appears well-developed and well-nourished.  No distress. HEENT: Head atraumatic, normocephalic, neck supple. CARDIOVASCULAR: Normal rate, regular rhythm and normal heart sounds.  No murmur heard. No BLE edema. PULMONARY: Effort normal and breath sounds normal. No respiratory distress. ABDOMINAL: There is no tenderness or distention. MUSCULOSKELETAL: Normal gait. Without gross motor or sensory deficit. PSYCHIATRIC: Patient has a normal mood and affect. behavior is normal. Judgment and thought content normal.  Vitals:   12/04/23 1315 12/04/23 1358  BP: (!) 148/96 (!) 144/92  Pulse: 84   Resp: 16   SpO2: 96%    Weight: 220 lb 12.8 oz (100.2 kg)   Height: 5' 10 (1.778 m)     Body mass index is 31.68 kg/m.  No results found for this or any previous visit (from the past 2160 hours).  Diabetic Foot Exam:     PHQ2/9:    12/04/2023    1:12 PM 07/05/2023    8:44 AM 04/09/2023   10:25 AM 01/18/2023    9:50 AM 01/08/2023    8:51 AM  Depression screen PHQ 2/9  Decreased Interest 3 0 0 0 0  Down, Depressed, Hopeless 3 0 0 0 0  PHQ - 2 Score 6 0 0 0 0  Altered sleeping 3 0 0  0  Tired, decreased energy 3 0 0  0  Change in appetite 1 0 0  0  Feeling bad or failure about yourself  0 0 0  0  Trouble concentrating 3 0 0  0  Moving slowly or fidgety/restless 0 0 0  0  Suicidal thoughts 0 0 0  0  PHQ-9 Score 16 0 0  0  Difficult doing work/chores Very difficult Not difficult at all Not difficult at all  Not difficult at all    phq 9 is positive  Fall Risk:    12/04/2023    1:09 PM 04/09/2023   10:17 AM 01/08/2023    8:50 AM 12/25/2022   11:20 AM 10/08/2022    8:02 AM  Fall Risk   Falls in the past year? 0 1 1 1  0  Number falls in past yr: 0 0 1 1   Injury with Fall? 0 0 0 0   Risk for fall due to : No Fall Risks Impaired balance/gait  History of fall(s) No Fall Risks  Follow up Falls evaluation completed Falls prevention discussed;Education provided;Falls evaluation completed  Falls evaluation completed;Falls prevention discussed;Education provided Falls prevention discussed      Assessment & Plan Hypertension Blood pressure elevated at 148/96 with variability. Stress and missed medication doses possible factors. On atenolol  25 mg nightly and olmesartan . HCTZ discontinued due to hypercalcemia. - Monitor blood pressure at home more frequently. - Ensure adherence to current medication regimen. - Consider increasing atenolol  to twice a day or 50 mg at night if blood pressure remains elevated.  Dyslipidemia Cholesterol levels not checked since 2023. Previous LDL 142. On Lovaza  for  triglycerides. Not on statin due to past elevated liver enzymes, now normalized. No alcohol consumption. - Order lab work to check liver enzymes and cholesterol levels. - Initiate statin therapy if liver enzymes are normal.  Obesity BMI 31.68. Weight gain of 15 pounds. Previously on weight loss medication, stopped 7-8 weeks ago. Plans to restart weight loss efforts. -  Restart weight loss medication Wegovy  at 0.25 mg. - Implement lifestyle modifications including walking 4-5 times a week, smaller portion sizes, and a balanced diet. - Send Wegovy  prescription to [facility] pharmacy.  Chronic kidney disease stage 3a Previous labs showed normalization of kidney function. Current status unknown. History of hypercalcemia related to excessive Tums intake, resolved with medication. - Order lab work to assess current kidney function.  History of hypercalcemia Hypercalcemia likely due to excessive Tums intake. Calcium levels normalized after stopping Tums and starting omeprazole .  Gastroesophageal reflux disease (GERD) Heartburn resolved with omeprazole  and cessation of alcohol. No current symptoms. - Continue omeprazole  as needed.  Insomnia Using trazodone  for sleep. - Refill trazodone  prescription.  Depression/Major recurrent  Increased stress and disinterest in activities. Previous medications Lexapro  and Wellbutrin  discontinued. Considering duloxetine  for treatment. Not attending therapy recently. - Initiate duloxetine  therapy. - Send duloxetine  prescription to [facility] pharmacy. - Encourage follow-up with psychiatrist for therapy and medication management.  Obstructive sleep apnea Using CPAP since spring 2024. Reports feeling more rested. - Continue CPAP therapy.  Convulsive seizure, single episode (remote) Single episode in past, possibly stress and lack of sleep related. No recurrence. On topiramate as precaution. - Continue topiramate as prescribed.  Supraventricular  tachycardia (SVT) No recent episodes. Previous episode attributed to stress and travel. No current symptoms. - Discuss SVT management with cardiologist during follow-up.  Ascending aortic dilation (mild) Mild dilation noted in previous imaging. No recent cardiologist follow-up. - Discuss aortic dilation with cardiologist during follow-up.  Alcohol use, in remission No current alcohol consumption. Previous relapse noted, currently abstinent.

## 2023-12-05 ENCOUNTER — Telehealth: Payer: Self-pay | Admitting: Pharmacy Technician

## 2023-12-05 ENCOUNTER — Other Ambulatory Visit (HOSPITAL_COMMUNITY): Payer: Self-pay

## 2023-12-05 NOTE — Telephone Encounter (Signed)
 Pharmacy Patient Advocate Encounter   Received notification from CoverMyMeds that prior authorization for Wegovy  0.25MG /0.5ML auto-injectors is required/requested.   Insurance verification completed.   The patient is insured through [facility] .   Per test claim: PA required; PA submitted to above mentioned insurance via CoverMyMeds Key/confirmation #/EOC [id number] Status is pending

## 2023-12-06 NOTE — Telephone Encounter (Signed)
 Pharmacy Patient Advocate Encounter  Received notification from [employer] that Prior Authorization for Wegovy  0.25MG /0.5ML auto-injectors has been DENIED.  Full denial letter will be uploaded to the media tab. See denial reason below.     PA #/Case ID/Reference #: [id number]

## 2023-12-08 ENCOUNTER — Encounter: Payer: Self-pay | Admitting: Family Medicine

## 2023-12-09 ENCOUNTER — Other Ambulatory Visit (HOSPITAL_COMMUNITY): Payer: Self-pay

## 2023-12-09 NOTE — Telephone Encounter (Signed)
 Good morning, we will send an appeal

## 2023-12-10 ENCOUNTER — Telehealth: Payer: Self-pay | Admitting: Pharmacist

## 2023-12-10 ENCOUNTER — Other Ambulatory Visit (HOSPITAL_COMMUNITY): Payer: Self-pay

## 2023-12-10 NOTE — Telephone Encounter (Signed)
 Pharmacy Patient Advocate Encounter  Received notification from [employer] that Prior Authorization for Wegovy  0.25MG /0.5ML auto-injectors has been APPROVED from 12/10/23 to 12/09/24   PA #/Case ID/Reference #: [id number]

## 2023-12-10 NOTE — Telephone Encounter (Signed)
 An E-Appeal has been submitted for Wegovy . Will advise when response is received, please be advised that most companies may take 30 days to make a decision. Appeal letter and supporting information have been uploaded and submitted via CMM Website.  Thank you, Devere Pandy, PharmD Clinical Pharmacist  [place]  Direct Dial: [phone]

## 2023-12-11 ENCOUNTER — Other Ambulatory Visit: Payer: Self-pay

## 2024-01-12 ENCOUNTER — Other Ambulatory Visit: Payer: Self-pay | Admitting: Family Medicine

## 2024-01-12 DIAGNOSIS — E66811 Obesity, class 1: Secondary | ICD-10-CM

## 2024-01-13 ENCOUNTER — Other Ambulatory Visit: Payer: Self-pay | Admitting: Family Medicine

## 2024-01-13 ENCOUNTER — Other Ambulatory Visit: Payer: Self-pay

## 2024-01-13 DIAGNOSIS — E66811 Obesity, class 1: Secondary | ICD-10-CM

## 2024-01-13 MED ORDER — WEGOVY 0.5 MG/0.5ML ~~LOC~~ SOAJ
0.5000 mg | SUBCUTANEOUS | 0 refills | Status: DC
  Filled 2024-01-13: qty 2, 28d supply, fill #0

## 2024-01-13 NOTE — Telephone Encounter (Signed)
 Pt willing to go up on dose

## 2024-01-13 NOTE — Telephone Encounter (Signed)
 Willing to go up

## 2024-01-17 ENCOUNTER — Ambulatory Visit: Payer: Self-pay

## 2024-01-17 ENCOUNTER — Telehealth: Admitting: Physician Assistant

## 2024-01-17 DIAGNOSIS — U071 COVID-19: Secondary | ICD-10-CM | POA: Diagnosis not present

## 2024-01-17 MED ORDER — NIRMATRELVIR/RITONAVIR (PAXLOVID)TABLET: 3.0000 | ORAL_TABLET | Freq: Two times a day (BID) | ORAL | 0 refills | Status: AC

## 2024-01-17 NOTE — Patient Instructions (Signed)
 Victor Valdez, thank you for joining Victor [name], PA-C for today's virtual visit.  While this provider is not your primary care provider (PCP), if your PCP is located in our provider database this encounter information will be shared with them immediately following your visit.   A [facility] MyChart account gives you access to today's visit and all your visits, tests, and labs performed at [facility]  click here if you don't have a [facility] MyChart account or go to [email or web address]  Consent: (Patient) Victor Valdez provided verbal consent for this virtual visit at the beginning of the encounter.  Current Medications:  Current Outpatient Medications:    nirmatrelvir /ritonavir  (PAXLOVID ) 20 x 150 MG & 10 x 100MG  TABS, Take 3 tablets by mouth 2 (two) times daily for 5 days. (Take nirmatrelvir  150 mg two tablets twice daily for 5 days and ritonavir  100 mg one tablet twice daily for 5 days) Patient GFR is 88, Disp: 30 tablet, Rfl: 0   atenolol  (TENORMIN ) 25 MG tablet, Take 1 tablet (25 mg total) by mouth every evening., Disp: 90 tablet, Rfl: 1   Clindamycin  Phosphate foam, Apply to affected areas scalp, chest, back once to twice daily as needed., Disp: 100 g, Rfl: 5   DULoxetine  (CYMBALTA ) 30 MG capsule, Take 1-2 capsules (30-60 mg total) by mouth daily. 30  mg first week after that two daily, Disp: 60 capsule, Rfl: 0   ferrous sulfate 325 (65 FE) MG tablet, Take 325 mg by mouth daily with breakfast., Disp: , Rfl:    metroNIDAZOLE  (METROGEL ) 0.75 % gel, Apply to face every night for rosacea., Disp: 45 g, Rfl: 5   olmesartan  (BENICAR ) 40 MG tablet, Take 1 tablet (40 mg total) by mouth daily., Disp: 90 tablet, Rfl: 1   omega-3 acid ethyl esters (LOVAZA ) 1 g capsule, Take 2 capsules (2 g total) by mouth 2 (two) times daily., Disp: 360 capsule, Rfl: 1   omeprazole  (PRILOSEC) 40 MG capsule, Take 1 capsule (40 mg total) by mouth 2 (two) times daily before a meal., Disp:  180 capsule, Rfl: 1   semaglutide -weight management (WEGOVY ) 0.5 MG/0.5ML SOAJ SQ injection, Inject 0.5 mg into the skin once a week., Disp: 2 mL, Rfl: 0   tadalafil  (CIALIS ) 20 MG tablet, Take 1 tablet (20 mg total) by mouth every other day as needed for erectile dysfunction. (Patient not taking: Reported on 12/04/2023), Disp: 30 tablet, Rfl: 1   topiramate (TOPAMAX) 50 MG tablet, Take 1 tablet by mouth at bedtime., Disp: , Rfl:    traZODone  (DESYREL ) 100 MG tablet, Take 1 tablet (100 mg total) by mouth at bedtime., Disp: 90 tablet, Rfl: 1   Medications ordered in this encounter:  Meds ordered this encounter  Medications   nirmatrelvir /ritonavir  (PAXLOVID ) 20 x 150 MG & 10 x 100MG  TABS    Sig: Take 3 tablets by mouth 2 (two) times daily for 5 days. (Take nirmatrelvir  150 mg two tablets twice daily for 5 days and ritonavir  100 mg one tablet twice daily for 5 days) Patient GFR is 88    Dispense:  30 tablet    Refill:  0    Supervising Provider:   BLAISE ALEENE KIDD [phone]     *If you need refills on other medications prior to your next appointment, please contact your pharmacy*  Follow-Up: Call back or seek an in-person evaluation if the symptoms worsen or if the condition fails to improve as anticipated.  [facility] [phone]  Care Instructions: Can take to lessen severity (if able): Vit C 500mg  twice daily Quercertin 250-500mg  twice daily Zinc 75-100mg  daily Melatonin 3-6 mg at bedtime Vit D3 1000-2000 IU daily Aspirin 81 mg daily with food Optional: Famotidine  20mg  daily Also can add tylenol/ibuprofen as needed for fevers and body aches May add Mucinex  or Mucinex  DM as needed for cough/congestion   Isolation Instructions: You are to isolate at home until you have been fever free for at least 24 hours without a fever-reducing medication, and symptoms have been steadily improving for 24 hours. At that time,  you can end isolation but need to mask for an  additional 5 days.   If you must be around other household members who do not have symptoms, you need to make sure that both you and the family members are masking consistently with a high-quality mask.  If you note any worsening of symptoms despite treatment, please seek an in-person evaluation ASAP. If you note any significant shortness of breath or any chest pain, please seek ER evaluation. Please do not delay care!   COVID-19: What to Do if You Are Sick If you test positive and are an older adult or someone who is at high risk of getting very sick from COVID-19, treatment may be available. Contact a healthcare provider right away after a positive test to determine if you are eligible, even if your symptoms are mild right now. You can also visit a Test to Treat location and, if eligible, receive a prescription from a provider. Don't delay: Treatment must be started within the first few days to be effective. If you have a fever, cough, or other symptoms, you might have COVID-19. Most people have mild illness and are able to recover at home. If you are sick: Keep track of your symptoms. If you have an emergency warning sign (including trouble breathing), call 911. Steps to help prevent the spread of COVID-19 if you are sick If you are sick with COVID-19 or think you might have COVID-19, follow the steps below to care for yourself and to help protect other people in your home and community. Stay home except to get medical care Stay home. Most people with COVID-19 have mild illness and can recover at home without medical care. Do not leave your home, except to get medical care. Do not visit public areas and do not go to places where you are unable to wear a mask. Take care of yourself. Get rest and stay hydrated. Take over-the-counter medicines, such as acetaminophen, to help you feel better. Stay in touch with your doctor. Call before you get medical care. Be sure to get care if you have trouble  breathing, or have any other emergency warning signs, or if you think it is an emergency. Avoid public transportation, ride-sharing, or taxis if possible. Get tested If you have symptoms of COVID-19, get tested. While waiting for test results, stay away from others, including staying apart from those living in your household. Get tested as soon as possible after your symptoms start. Treatments may be available for people with COVID-19 who are at risk for becoming very sick. Don't delay: Treatment must be started early to be effective--some treatments must begin within 5 days of your first symptoms. Contact your healthcare provider right away if your test result is positive to determine if you are eligible. Self-tests are one of several options for testing for the virus that causes COVID-19 and may be more convenient than laboratory-based tests and  point-of-care tests. Ask your healthcare provider or your local health department if you need help interpreting your test results. You can visit your state, tribal, local, and territorial health department's website to look for the latest local information on testing sites. Separate yourself from other people As much as possible, stay in a specific room and away from other people and pets in your home. If possible, you should use a separate bathroom. If you need to be around other people or animals in or outside of the home, wear a well-fitting mask. Tell your close contacts that they may have been exposed to COVID-19. An infected person can spread COVID-19 starting 48 hours (or 2 days) before the person has any symptoms or tests positive. By letting your close contacts know they may have been exposed to COVID-19, you are helping to protect everyone. See COVID-19 and Animals if you have questions about pets. If you are diagnosed with COVID-19, someone from the health department may call you. Answer the call to slow the spread. Monitor your symptoms Symptoms of  COVID-19 include fever, cough, or other symptoms. Follow care instructions from your healthcare provider and local health department. Your local health authorities may give instructions on checking your symptoms and reporting information. When to seek emergency medical attention Look for emergency warning signs* for COVID-19. If someone is showing any of these signs, seek emergency medical care immediately: Trouble breathing Persistent pain or pressure in the chest New confusion Inability to wake or stay awake Pale, gray, or blue-colored skin, lips, or nail beds, depending on skin tone *This list is not all possible symptoms. Please call your medical provider for any other symptoms that are severe or concerning to you. Call 911 or call ahead to your local emergency facility: Notify the operator that you are seeking care for someone who has or may have COVID-19. Call ahead before visiting your doctor Call ahead. Many medical visits for routine care are being postponed or done by phone or telemedicine. If you have a medical appointment that cannot be postponed, call your doctor's office, and tell them you have or may have COVID-19. This will help the office protect themselves and other patients. If you are sick, wear a well-fitting mask You should wear a mask if you must be around other people or animals, including pets (even at home). Wear a mask with the best fit, protection, and comfort for you. You don't need to wear the mask if you are alone. If you can't put on a mask (because of trouble breathing, for example), cover your coughs and sneezes in some other way. Try to stay at least 6 feet away from other people. This will help protect the people around you. Masks should not be placed on young children under age 58 years, anyone who has trouble breathing, or anyone who is not able to remove the mask without help. Cover your coughs and sneezes Cover your mouth and nose with a tissue when you  cough or sneeze. Throw away used tissues in a lined trash can. Immediately wash your hands with soap and water for at least 20 seconds. If soap and water are not available, clean your hands with an alcohol-based hand sanitizer that contains at least 60% alcohol. Clean your hands often Wash your hands often with soap and water for at least 20 seconds. This is especially important after blowing your nose, coughing, or sneezing; going to the bathroom; and before eating or preparing food. Use hand sanitizer if soap  and water are not available. Use an alcohol-based hand sanitizer with at least 60% alcohol, covering all surfaces of your hands and rubbing them together until they feel dry. Soap and water are the best option, especially if hands are visibly dirty. Avoid touching your eyes, nose, and mouth with unwashed hands. Handwashing Tips Avoid sharing personal household items Do not share dishes, drinking glasses, cups, eating utensils, towels, or bedding with other people in your home. Wash these items thoroughly after using them with soap and water or put in the dishwasher. Clean surfaces in your home regularly Clean and disinfect high-touch surfaces (for example, doorknobs, tables, handles, light switches, and countertops) in your sick room and bathroom. In shared spaces, you should clean and disinfect surfaces and items after each use by the person who is ill. If you are sick and cannot clean, a caregiver or other person should only clean and disinfect the area around you (such as your bedroom and bathroom) on an as needed basis. Your caregiver/other person should wait as long as possible (at least several hours) and wear a mask before entering, cleaning, and disinfecting shared spaces that you use. Clean and disinfect areas that may have blood, stool, or body fluids on them. Use household cleaners and disinfectants. Clean visible dirty surfaces with household cleaners containing soap or  detergent. Then, use a household disinfectant. Use a product from [employer] List N: Disinfectants for Coronavirus (COVID-19). Be sure to follow the instructions on the label to ensure safe and effective use of the product. Many products recommend keeping the surface wet with a disinfectant for a certain period of time (look at contact time on the product label). You may also need to wear personal protective equipment, such as gloves, depending on the directions on the product label. Immediately after disinfecting, wash your hands with soap and water for 20 seconds. For completed guidance on cleaning and disinfecting your home, visit Complete Disinfection Guidance. Take steps to improve ventilation at home Improve ventilation (air flow) at home to help prevent from spreading COVID-19 to other people in your household. Clear out COVID-19 virus particles in the air by opening windows, using air filters, and turning on fans in your home. Use this interactive tool to learn how to improve air flow in your home. When you can be around others after being sick with COVID-19 Deciding when you can be around others is different for different situations. Find out when you can safely end home isolation. For any additional questions about your care, contact your healthcare provider or state or local health department. 07/26/2020 Content source: [facility] ([facility]), Division of Viral Diseases This information is not intended to replace advice given to you by your health care provider. Make sure you discuss any questions you have with your health care provider. Document Revised: 09/08/2020 Document Reviewed: 09/08/2020 Elsevier Patient Education  2022 [employer].     If you have been instructed to have an in-person evaluation today at a local Urgent Care facility, please use the link below. It will take you to a list of all of our available [facility], including address, phone number and hours of operation. Please do not delay care.  [facility]  If you or a family member do not have a primary care provider, use the link below to schedule a visit and establish care. When you choose a [facility] primary care physician or advanced practice provider, you gain  a long-term partner in health. Find a Primary Care Provider  Learn more about [facility] in-office and virtual care options: [facility] - Get Care Now

## 2024-01-17 NOTE — Telephone Encounter (Signed)
 Patient scheduled for a [facility] visit for today 01/17/2024 at 9:45AM  FYI Only or Action Required?: FYI only for provider.  Patient was last seen in primary care on 12/04/2023 by Glenard Mire, MD.  Called Nurse Triage reporting Nasal Congestion and Covid Positive.  Symptoms began last night.  Interventions attempted: Nothing.  Symptoms are: unchanged.  Triage Disposition: Call PCP Within 24 Hours  Patient/caregiver understands and will follow disposition?: Yes---virtual urgent care visit made due to no availability in office--pt wanted a virtual visit              Copied from CRM #[id number]. Topic: Clinical - Red Word Triage >> Jan 17, 2024  8:33 AM Turkey B wrote: [facility] that prompted transfer to Nurse Triage: patient states feel bad, achy, runny nose, starting to loose voice Reason for Disposition  [1] HIGH RISK patient (e.g., weak immune system, age > 64 years, obesity with BMI 30 or higher, pregnant, chronic lung disease or other chronic medical condition) AND [2] COVID symptoms (e.g., cough, fever)  (Exceptions: Already seen by PCP and no new or worsening symptoms.)  Answer Assessment - Initial Assessment Questions Body aches, tired, congestion Denies fever Nasal spray helped somewhat 8 am today covid positive on home test Last night symptoms started Patient states that he does not feel bad but he wanted to see about possibly getting something like Paxlovid  to lessen the possible severity of symptoms Patient wanted to do a virtual visit No availability at his PCP office Patient scheduled for a Virtual Urgent Care visit      1. LOCATION: Where does it hurt?      sinuses 2. ONSET: When did the sinus pain start?  (e.g., hours, days)      Last night 3. SEVERITY: How bad is the pain?   (Scale 0-10; or none, mild, moderate or severe)     ------ 4. RECURRENT SYMPTOM: Have you ever had sinus problems before? If Yes, ask: When  was the last time? and What happened that time?      ----- 5. NASAL CONGESTION: Is the nose blocked? If Yes, ask: Can you open it or must you breathe through your mouth?     congestion 6. NASAL DISCHARGE: Do you have discharge from your nose? If so ask, What color?     clear 7. FEVER: Do you have a fever? If Yes, ask: What is it, how was it measured, and when did it start?      no 8. OTHER SYMPTOMS: Do you have any other symptoms? (e.g., sore throat, cough, earache, difficulty breathing)     Body aches, tired  Answer Assessment - Initial Assessment Questions 1. COVID-19 DIAGNOSIS: How do you know that you have COVID? (e.g., positive lab test or self-test, diagnosed by doctor or NP/PA, symptoms after exposure).     Home test positive this morning 2. COVID-19 EXPOSURE: Was there any known exposure to COVID before the symptoms began? CDC Definition of close contact: within 6 feet (2 meters) for a total of 15 minutes or more over a 24-hour period.      ---- 3. ONSET: When did the COVID-19 symptoms start?      Last night 4. WORST SYMPTOM: What is your worst symptom? (e.g., cough, fever, shortness of breath, muscle aches)     congestion 5. COUGH: Do you have a cough? If Yes, ask: How bad is the cough?       Slight but not often 6.  FEVER: Do you have a fever? If Yes, ask: What is your temperature, how was it measured, and when did it start?     no 7. RESPIRATORY STATUS: Describe your breathing? (e.g., normal; shortness of breath, wheezing, unable to speak)      Denies difficulty breathing but is congested 8. BETTER-SAME-WORSE: Are you getting better, staying the same or getting worse compared to yesterday?  If getting worse, ask, In what way?     About the same 9. OTHER SYMPTOMS: Do you have any other symptoms?  (e.g., chills, fatigue, headache, loss of smell or taste, muscle pain, sore throat)     tired 10. HIGH RISK DISEASE: Do you have any chronic  medical problems? (e.g., asthma, heart or lung disease, weak immune system, obesity, etc.)       Patient works in healthcare 11. VACCINE: Have you had the COVID-19 vaccine? If Yes, ask: Which one, how many shots, when did you get it?    -----  Protocols used: Sinus Pain or Congestion-A-AH, Coronavirus (COVID-19) Diagnosed or Suspected-A-AH

## 2024-01-17 NOTE — Progress Notes (Signed)
 Virtual Visit Consent   [place], you are scheduled for a virtual visit with a [facility] provider today. Just as with appointments in the office, your consent must be obtained to participate. Your consent will be active for this visit and any virtual visit you may have with one of our providers in the next 365 days. If you have a MyChart account, a copy of this consent can be sent to you electronically.  As this is a virtual visit, video technology does not allow for your provider to perform a traditional examination. This may limit your provider's ability to fully assess your condition. If your provider identifies any concerns that need to be evaluated in person or the need to arrange testing (such as labs, EKG, etc.), we will make arrangements to do so. Although advances in technology are sophisticated, we cannot ensure that it will always work on either your end or our end. If the connection with a video visit is poor, the visit may have to be switched to a telephone visit. With either a video or telephone visit, we are not always able to ensure that we have a secure connection.  By engaging in this virtual visit, you consent to the provision of healthcare and authorize for your insurance to be billed (if applicable) for the services provided during this visit. Depending on your insurance coverage, you may receive a charge related to this service.  I need to obtain your verbal consent now. Are you willing to proceed with your visit today? [name] has provided verbal consent on 01/17/2024 for a virtual visit (video or telephone). Delon [name], PA-C  Date: 01/17/2024 9:53 AM   Virtual Visit via Video Note   I, Delon [name], connected with  [place]  ([id number], December 07, 1965) on 01/17/24 at  9:45 AM EDT by a video-enabled telemedicine application and verified that I am speaking with the correct person using two identifiers.  Location: Patient: Virtual Visit Location  Patient: Home Provider: Virtual Visit Location Provider: Home Office   I discussed the limitations of evaluation and management by telemedicine and the availability of in person appointments. The patient expressed understanding and agreed to proceed.    History of Present Illness: Victor Valdez is a 58 y.o. who identifies as a male who was assigned male at birth, and is being seen today for Covid 75.  HPI: URI  This is a new problem. The current episode started yesterday (Symptoms started yesterday, Tested positive for Covid today). The problem has been unchanged. There has been no fever. Associated symptoms include congestion, coughing (mild), rhinorrhea (and post nasal drainage), sinus pain and sneezing. Pertinent negatives include no diarrhea, ear pain, headaches, nausea, plugged ear sensation, sore throat or wheezing. Associated symptoms comments: Mild body aches. He has tried antihistamine (zyrtec) for the symptoms. The treatment provided no relief.     Problems:  Patient Active Problem List   Diagnosis Date Noted   Small vessel disease (HCC) 12/04/2023   Primary insomnia 12/04/2023   Neuropathic pain of both legs 12/04/2023   Gastroesophageal reflux disease with esophagitis without hemorrhage 07/05/2023   SVT (supraventricular tachycardia) (HCC) 07/05/2023   Alcoholism in remission (HCC) 07/05/2023   Erosive esophagitis 06/13/2023   Chronic GERD 06/13/2023   Abnormal serum immunoelectrophoresis 01/09/2023   Iron deficiency anemia 12/26/2021   Gastroesophageal reflux disease without esophagitis 12/26/2021   Obesity (BMI 30.0-34.9) 12/26/2021   Snoring 12/26/2021   Stage 3a chronic kidney disease (HCC) 12/26/2021   Mild  dilation of ascending aorta (HCC) 04/04/2020   Hepatic steatosis 04/04/2020   Low serum parathyroid hormone (PTH) 04/04/2020   Hypomagnesemia 04/04/2020   Calcium metabolism disorder 04/04/2020   Narrow complex tachycardia (HCC) 03/30/2020   Internal  hemorrhoids without complication 10/23/2016   Diverticulosis 10/23/2016   ADD (attention deficit disorder) 11/13/2014   Basal cell carcinoma of face 11/13/2014   CD (contact dermatitis) 11/13/2014   Dyslipidemia 11/13/2014   Blood glucose elevated 11/13/2014   Benign hypertension 11/13/2014   Perennial allergic rhinitis with seasonal variation 11/13/2014   Allergic rhinitis 11/13/2014    Allergies:  Allergies  Allergen Reactions   Aspirin Anaphylaxis   Nsaids    Penicillins    Medications:  Current Outpatient Medications:    nirmatrelvir /ritonavir  (PAXLOVID ) 20 x 150 MG & 10 x 100MG  TABS, Take 3 tablets by mouth 2 (two) times daily for 5 days. (Take nirmatrelvir  150 mg two tablets twice daily for 5 days and ritonavir  100 mg one tablet twice daily for 5 days) Patient GFR is 88, Disp: 30 tablet, Rfl: 0   atenolol  (TENORMIN ) 25 MG tablet, Take 1 tablet (25 mg total) by mouth every evening., Disp: 90 tablet, Rfl: 1   Clindamycin  Phosphate foam, Apply to affected areas scalp, chest, back once to twice daily as needed., Disp: 100 g, Rfl: 5   DULoxetine  (CYMBALTA ) 30 MG capsule, Take 1-2 capsules (30-60 mg total) by mouth daily. 30  mg first week after that two daily, Disp: 60 capsule, Rfl: 0   ferrous sulfate 325 (65 FE) MG tablet, Take 325 mg by mouth daily with breakfast., Disp: , Rfl:    metroNIDAZOLE  (METROGEL ) 0.75 % gel, Apply to face every night for rosacea., Disp: 45 g, Rfl: 5   olmesartan  (BENICAR ) 40 MG tablet, Take 1 tablet (40 mg total) by mouth daily., Disp: 90 tablet, Rfl: 1   omega-3 acid ethyl esters (LOVAZA ) 1 g capsule, Take 2 capsules (2 g total) by mouth 2 (two) times daily., Disp: 360 capsule, Rfl: 1   omeprazole  (PRILOSEC) 40 MG capsule, Take 1 capsule (40 mg total) by mouth 2 (two) times daily before a meal., Disp: 180 capsule, Rfl: 1   semaglutide -weight management (WEGOVY ) 0.5 MG/0.5ML SOAJ SQ injection, Inject 0.5 mg into the skin once a week., Disp: 2 mL, Rfl: 0    tadalafil  (CIALIS ) 20 MG tablet, Take 1 tablet (20 mg total) by mouth every other day as needed for erectile dysfunction. (Patient not taking: Reported on 12/04/2023), Disp: 30 tablet, Rfl: 1   topiramate (TOPAMAX) 50 MG tablet, Take 1 tablet by mouth at bedtime., Disp: , Rfl:    traZODone  (DESYREL ) 100 MG tablet, Take 1 tablet (100 mg total) by mouth at bedtime., Disp: 90 tablet, Rfl: 1  Observations/Objective: Patient is well-developed, well-nourished in no acute distress.  Resting comfortably at home.  Head is normocephalic, atraumatic.  No labored breathing.  Speech is clear and coherent with logical content.  Patient is alert and oriented at baseline.    Assessment and Plan: 1. COVID (Primary) - nirmatrelvir /ritonavir  (PAXLOVID ) 20 x 150 MG & 10 x 100MG  TABS; Take 3 tablets by mouth 2 (two) times daily for 5 days. (Take nirmatrelvir  150 mg two tablets twice daily for 5 days and ritonavir  100 mg one tablet twice daily for 5 days) Patient GFR is 88  Dispense: 30 tablet; Refill: 0  - Continue OTC symptomatic management of choice - Will send OTC vitamins and supplement information through AVS - Paxlovid  prescribed -  Push fluids - Rest as needed - Discussed return precautions and when to seek in-person evaluation, sent via AVS as well   Follow Up Instructions: I discussed the assessment and treatment plan with the patient. The patient was provided an opportunity to ask questions and all were answered. The patient agreed with the plan and demonstrated an understanding of the instructions.  A copy of instructions were sent to the patient via MyChart unless otherwise noted below.    The patient was advised to call back or seek an in-person evaluation if the symptoms worsen or if the condition fails to improve as anticipated.    Delon [name], PA-C

## 2024-02-05 ENCOUNTER — Encounter: Payer: Self-pay | Admitting: Family Medicine

## 2024-02-13 ENCOUNTER — Other Ambulatory Visit: Payer: Self-pay | Admitting: Family Medicine

## 2024-02-13 ENCOUNTER — Other Ambulatory Visit: Payer: Self-pay

## 2024-02-13 MED FILL — Semaglutide (Weight Mngmt) Soln Auto-Injector 0.5 MG/0.5ML: SUBCUTANEOUS | 28 days supply | Qty: 2 | Fill #0 | Status: AC

## 2024-02-14 ENCOUNTER — Other Ambulatory Visit: Payer: Self-pay

## 2024-03-06 ENCOUNTER — Ambulatory Visit: Admitting: Family Medicine

## 2024-03-10 ENCOUNTER — Encounter: Payer: Self-pay | Admitting: Family Medicine

## 2024-03-12 ENCOUNTER — Other Ambulatory Visit: Payer: Self-pay | Admitting: Family Medicine

## 2024-03-13 ENCOUNTER — Other Ambulatory Visit: Payer: Self-pay

## 2024-03-31 ENCOUNTER — Encounter: Payer: 59 | Admitting: Dermatology

## 2024-04-10 ENCOUNTER — Ambulatory Visit: Admitting: Family Medicine

## 2024-04-10 ENCOUNTER — Encounter: Payer: Self-pay | Admitting: Family Medicine

## 2024-04-10 ENCOUNTER — Other Ambulatory Visit: Payer: Self-pay

## 2024-04-10 VITALS — BP 114/76 | HR 61 | Resp 16 | Ht 70.0 in | Wt 246.3 lb

## 2024-04-10 DIAGNOSIS — F1021 Alcohol dependence, in remission: Secondary | ICD-10-CM

## 2024-04-10 DIAGNOSIS — R635 Abnormal weight gain: Secondary | ICD-10-CM

## 2024-04-10 DIAGNOSIS — E785 Hyperlipidemia, unspecified: Secondary | ICD-10-CM

## 2024-04-10 DIAGNOSIS — R7989 Other specified abnormal findings of blood chemistry: Secondary | ICD-10-CM

## 2024-04-10 DIAGNOSIS — Z131 Encounter for screening for diabetes mellitus: Secondary | ICD-10-CM | POA: Diagnosis not present

## 2024-04-10 DIAGNOSIS — I471 Supraventricular tachycardia, unspecified: Secondary | ICD-10-CM

## 2024-04-10 DIAGNOSIS — F901 Attention-deficit hyperactivity disorder, predominantly hyperactive type: Secondary | ICD-10-CM

## 2024-04-10 DIAGNOSIS — K219 Gastro-esophageal reflux disease without esophagitis: Secondary | ICD-10-CM | POA: Diagnosis not present

## 2024-04-10 DIAGNOSIS — N1831 Chronic kidney disease, stage 3a: Secondary | ICD-10-CM | POA: Diagnosis not present

## 2024-04-10 MED ORDER — WEGOVY 0.25 MG/0.5ML ~~LOC~~ SOAJ
0.2500 mg | SUBCUTANEOUS | 0 refills | Status: DC
  Filled 2024-04-10: qty 2, 28d supply, fill #0

## 2024-04-10 MED ORDER — WEGOVY 0.5 MG/0.5ML ~~LOC~~ SOAJ
0.5000 mg | SUBCUTANEOUS | 0 refills | Status: DC
  Filled 2024-04-10 – 2024-04-25 (×2): qty 2, 28d supply, fill #0

## 2024-04-10 NOTE — Progress Notes (Signed)
 Name: Victor Valdez   MRN: [id number]    DOB: 18-Jun-1965   Date:04/10/2024       Progress Note  Subjective  Chief Complaint  Chief Complaint  Patient presents with   Medical Management of Chronic Issues   Discussed the use of AI scribe software for clinical note transcription with the patient, who gave verbal consent to proceed.  History of Present Illness Victor Valdez is a 58 year old male with alcohol use disorder who presents for follow-up after recent rehabilitation.  He recently completed a 21-day rehabilitation program in [place] followed by a 28-day program in [place] for alcohol use disorder. Since returning home, he feels great but has gained significant weight, which he attributes to the medications prescribed during rehab. His mother-in-law's illness and subsequent passing in late August were significant stressors contributing to his relapse.  He is currently taking amlodipine  5 mg, duloxetine  60 mg (two capsules of 30 mg), olmesartan  20 mg, atenolol  50 mg, trazodone  100 mg for sleep, omeprazole  40 mg for stomach issues, a multivitamin, folic acid , and Seroquel 25 mg for sleep. He wants to discontinue Seroquel due to weight gain and dissatisfaction with its effects on his sleep. He is no longer taking Requip for restless leg syndrome and reports no current issues with leg movements at night.  He has been prescribed Vivitrol for alcohol use disorder and is interested in continuing this treatment locally. He mentions a medication for ADD, referred to as 'Envy something,' which he finds helpful for focus. He has returned to work and reports that his employer was supportive during his rehabilitation, continuing to pay him throughout his absence.  No current issues with reflux. He is not experiencing symptoms of depression at present, although he acknowledges feeling significantly worse two months ago. He is planning to establish care with a local psychiatrist and therapist for  ongoing management of his conditions.    Patient Active Problem List   Diagnosis Date Noted   Small vessel disease 12/04/2023   Primary insomnia 12/04/2023   Neuropathic pain of both legs 12/04/2023   Gastroesophageal reflux disease with esophagitis without hemorrhage 07/05/2023   SVT (supraventricular tachycardia) 07/05/2023   Alcoholism in remission (HCC) 07/05/2023   Erosive esophagitis 06/13/2023   Chronic GERD 06/13/2023   Abnormal serum immunoelectrophoresis 01/09/2023   Iron deficiency anemia 12/26/2021   Gastroesophageal reflux disease without esophagitis 12/26/2021   Obesity (BMI 30.0-34.9) 12/26/2021   Snoring 12/26/2021   Stage 3a chronic kidney disease (HCC) 12/26/2021   Mild dilation of ascending aorta 04/04/2020   Hepatic steatosis 04/04/2020   Low serum parathyroid hormone (PTH) 04/04/2020   Hypomagnesemia 04/04/2020   Calcium metabolism disorder 04/04/2020   Narrow complex tachycardia 03/30/2020   Internal hemorrhoids without complication 10/23/2016   Diverticulosis 10/23/2016   ADD (attention deficit disorder) 11/13/2014   Basal cell carcinoma of face 11/13/2014   CD (contact dermatitis) 11/13/2014   Dyslipidemia 11/13/2014   Blood glucose elevated 11/13/2014   Benign hypertension 11/13/2014   Perennial allergic rhinitis with seasonal variation 11/13/2014   Allergic rhinitis 11/13/2014    Past Surgical History:  Procedure Laterality Date   COLONOSCOPY WITH PROPOFOL  N/A 10/08/2016   Procedure: COLONOSCOPY WITH PROPOFOL ;  Surgeon: Gaylyn Gladis PENNER, MD;  Location: [facility] ENDOSCOPY;  Service: Endoscopy;  Laterality: N/A;   COLONOSCOPY WITH PROPOFOL  N/A 06/13/2023   Procedure: COLONOSCOPY WITH PROPOFOL ;  Surgeon: Unk Corinn Skiff, MD;  Location[facility] ENDOSCOPY;  Service: Gastroenterology;  Laterality: N/A;   ESOPHAGOGASTRODUODENOSCOPY (EGD) WITH PROPOFOL   N/A 06/13/2023   Procedure: ESOPHAGOGASTRODUODENOSCOPY (EGD) WITH PROPOFOL ;  Surgeon: Unk Corinn Skiff, MD;   Location: [facility] ENDOSCOPY;  Service: Gastroenterology;  Laterality: N/A;   EYE SURGERY Bilateral    lasik   LASIK Bilateral 12.30.2015    Family History  Problem Relation Age of Onset   Hypertension Mother    Cancer Father 46       prostate cancer   Hypertension Sister    Obesity Sister     Social History   Tobacco Use   Smoking status: Never   Smokeless tobacco: Former  Substance Use Topics   Alcohol use: Not Currently    Comment: States stopped on 10/05/2020 Had a slipped up and drank one day on 11/07/2022     Current Outpatient Medications:    amLODipine  (NORVASC) 10 MG tablet, Take 10 mg by mouth every morning., Disp: , Rfl:    atenolol  (TENORMIN ) 50 MG tablet, Take 50 mg by mouth at bedtime., Disp: , Rfl:    Clindamycin  Phosphate foam, Apply to affected areas scalp, chest, back once to twice daily as needed., Disp: 100 g, Rfl: 5   DULoxetine  (CYMBALTA ) 30 MG capsule, Take 1-2 capsules (30-60 mg total) by mouth daily. 30  mg first week after that two daily (Patient taking differently: Take 30-60 mg by mouth 2 (two) times daily. 30  mg first week after that two daily), Disp: 60 capsule, Rfl: 0   ferrous sulfate 325 (65 FE) MG tablet, Take 325 mg by mouth daily with breakfast., Disp: , Rfl:    metroNIDAZOLE  (METROGEL ) 0.75 % gel, Apply to face every night for rosacea., Disp: 45 g, Rfl: 5   olmesartan  (BENICAR ) 20 MG tablet, Take 20 mg by mouth every morning., Disp: , Rfl:    omeprazole  (PRILOSEC) 40 MG capsule, Take 1 capsule (40 mg total) by mouth 2 (two) times daily before a meal., Disp: 180 capsule, Rfl: 1   QUEtiapine (SEROQUEL) 50 MG tablet, Take 50 mg by mouth at bedtime as needed., Disp: , Rfl:    tadalafil  (CIALIS ) 20 MG tablet, Take 1 tablet (20 mg total) by mouth every other day as needed for erectile dysfunction., Disp: 30 tablet, Rfl: 1   topiramate (TOPAMAX) 50 MG tablet, Take 1 tablet by mouth at bedtime., Disp: , Rfl:    traZODone  (DESYREL ) 100 MG tablet, Take 1  tablet (100 mg total) by mouth at bedtime., Disp: 90 tablet, Rfl: 1   VIVITROL 380 MG SUSR IM injection, SMARTSIG:4 Milliliter(s) IM Once a Month, Disp: , Rfl:    atenolol  (TENORMIN ) 25 MG tablet, Take 1 tablet (25 mg total) by mouth every evening. (Patient not taking: Reported on 04/10/2024), Disp: 90 tablet, Rfl: 1   olmesartan  (BENICAR ) 40 MG tablet, Take 1 tablet (40 mg total) by mouth daily. (Patient not taking: Reported on 04/10/2024), Disp: 90 tablet, Rfl: 1   omega-3 acid ethyl esters (LOVAZA ) 1 g capsule, Take 2 capsules (2 g total) by mouth 2 (two) times daily. (Patient not taking: Reported on 04/10/2024), Disp: 360 capsule, Rfl: 1   semaglutide -weight management (WEGOVY ) 0.5 MG/0.5ML SOAJ SQ injection, Inject 0.5 mg into the skin once a week. (Patient not taking: Reported on 04/10/2024), Disp: 2 mL, Rfl: 0  Allergies  Allergen Reactions   Aspirin Anaphylaxis   Nsaids    Penicillins     I personally reviewed active problem list, medication list, allergies, family history with the patient/caregiver today.   ROS  Ten systems reviewed and is negative except  as mentioned in HPI    Objective Physical Exam CONSTITUTIONAL: Patient appears well-developed and well-nourished.  No distress. HEENT: Head atraumatic, normocephalic, neck supple. CARDIOVASCULAR: Normal rate, regular rhythm and normal heart sounds.  No murmur heard. No BLE edema. PULMONARY: Effort normal and breath sounds normal. No respiratory distress. ABDOMINAL: There is no tenderness or distention. MUSCULOSKELETAL: Normal gait. Without gross motor or sensory deficit. PSYCHIATRIC: Patient has a normal mood and affect. behavior is normal. Judgment and thought content normal.  Vitals:   04/10/24 1114  BP: 114/76  Pulse: 61  Resp: 16  SpO2: 97%  Weight: 246 lb 4.8 oz (111.7 kg)  Height: 5' 10 (1.778 m)    Body mass index is 35.34 kg/m.   PHQ2/9:    04/10/2024   11:12 AM 12/04/2023    1:12 PM 07/05/2023    8:44 AM  04/09/2023   10:25 AM 01/18/2023    9:50 AM  Depression screen PHQ 2/9  Decreased Interest 0 3 0 0 0  Down, Depressed, Hopeless 0 3 0 0 0  PHQ - 2 Score 0 6 0 0 0  Altered sleeping 0 3 0 0   Tired, decreased energy 0 3 0 0   Change in appetite 0 1 0 0   Feeling bad or failure about yourself  0 0 0 0   Trouble concentrating 0 3 0 0   Moving slowly or fidgety/restless 0 0 0 0   Suicidal thoughts 0 0 0 0   PHQ-9 Score 0 16  0  0    Difficult doing work/chores Not difficult at all Very difficult Not difficult at all Not difficult at all      Data saved with a previous flowsheet row definition    phq 9 is negative  Fall Risk:    04/10/2024   11:09 AM 12/04/2023    1:09 PM 04/09/2023   10:17 AM 01/08/2023    8:50 AM 12/25/2022   11:20 AM  Fall Risk   Falls in the past year? 0 0 1 1 1   Number falls in past yr: 0 0 0 1 1  Injury with Fall? 0 0  0  0  0   Risk for fall due to : No Fall Risks No Fall Risks Impaired balance/gait  History of fall(s)  Follow up Falls evaluation completed Falls evaluation completed Falls prevention discussed;Education provided;Falls evaluation completed  Falls evaluation completed;Falls prevention discussed;Education provided     Data saved with a previous flowsheet row definition      Assessment & Plan Alcohol dependence, in remission Alcohol dependence with history of recovery center stays. Receiving Vivitrol for management. - Attend [facility] appointment for Vivitrol management and psychiatrist referral. - Continue Vivitrol injections as prescribed.  Morbid obesity with abnormal weight gain Morbid obesity with weight gain likely due to Seroquel. Wegovy  was effective previously. - Restart Wegovy  at 0.25 mg for one month, increase to 0.5 mg if tolerated. - Discontinue Seroquel if possible, monitor weight changes. - Monitor weight and adjust treatment as needed.  Attention-deficit hyperactivity disorder ADHD managed with current medication. Seeking  local psychiatrist for management. - Seek local psychiatrist for ongoing ADHD management.  Hypertension Hypertension well-controlled with current medications. - Continue current antihypertensive medications: amlodipine , olmesartan , atenolol .  Hyperlipidemia Dyslipidemia requires monitoring. - Ordered lipid panel to assess current status of hyperlipidemia.  Chronic kidney disease, stage 3a Stage 3a CKD requires monitoring of kidney function. - Ordered comprehensive metabolic panel to assess kidney function.  Depression, in  remission Depression in remission, no current symptoms. - Continue current management and monitor for recurrence.  Gastroesophageal reflux disease GERD well-controlled with current medication. - Continue current medication: omeprazole .

## 2024-04-13 ENCOUNTER — Ambulatory Visit: Payer: Self-pay | Admitting: Family Medicine

## 2024-04-13 LAB — COMPREHENSIVE METABOLIC PANEL WITH GFR
ALT: 20 IU/L (ref 0–44)
AST: 27 IU/L (ref 0–40)
Albumin: 4 g/dL (ref 3.8–4.9)
Alkaline Phosphatase: 92 IU/L (ref 47–123)
BUN/Creatinine Ratio: 12 (ref 9–20)
BUN: 16 mg/dL (ref 6–24)
Bilirubin Total: 0.5 mg/dL (ref 0.0–1.2)
CO2: 22 mmol/L (ref 20–29)
Calcium: 9.5 mg/dL (ref 8.7–10.2)
Chloride: 103 mmol/L (ref 96–106)
Creatinine, Ser: 1.33 mg/dL — ABNORMAL HIGH (ref 0.76–1.27)
Globulin, Total: 2.4 g/dL (ref 1.5–4.5)
Glucose: 111 mg/dL — ABNORMAL HIGH (ref 70–99)
Potassium: 4.3 mmol/L (ref 3.5–5.2)
Sodium: 139 mmol/L (ref 134–144)
Total Protein: 6.4 g/dL (ref 6.0–8.5)
eGFR: 62 mL/min/1.73 (ref >59)

## 2024-04-13 LAB — HEMOGLOBIN A1C
Est. average glucose Bld gHb Est-mCnc: 126 mg/dL
Hgb A1c MFr Bld: 6 % — ABNORMAL HIGH (ref 4.8–5.6)

## 2024-04-13 LAB — CBC WITH DIFFERENTIAL/PLATELET
Basophils Absolute: 0.1 x10E3/uL (ref 0.0–0.2)
Basos: 1 %
EOS (ABSOLUTE): 0.3 x10E3/uL (ref 0.0–0.4)
Eos: 6 %
Hematocrit: 39.2 % (ref 37.5–51.0)
Hemoglobin: 12.5 g/dL — ABNORMAL LOW (ref 13.0–17.7)
Immature Grans (Abs): 0 x10E3/uL (ref 0.0–0.1)
Immature Granulocytes: 0 %
Lymphocytes Absolute: 2.2 x10E3/uL (ref 0.7–3.1)
Lymphs: 38 %
MCH: 27.9 pg (ref 26.6–33.0)
MCHC: 31.9 g/dL (ref 31.5–35.7)
MCV: 88 fL (ref 79–97)
Monocytes Absolute: 0.6 x10E3/uL (ref 0.1–0.9)
Monocytes: 10 %
Neutrophils Absolute: 2.7 x10E3/uL (ref 1.4–7.0)
Neutrophils: 45 %
Platelets: 258 x10E3/uL (ref 150–450)
RBC: 4.48 x10E6/uL (ref 4.14–5.80)
RDW: 13.8 % (ref 11.6–15.4)
WBC: 5.9 x10E3/uL (ref 3.4–10.8)

## 2024-04-13 LAB — LIPID PANEL
Chol/HDL Ratio: 4.3 ratio (ref 0.0–5.0)
Cholesterol, Total: 206 mg/dL — ABNORMAL HIGH (ref 100–199)
HDL: 48 mg/dL (ref >39)
LDL Chol Calc (NIH): 123 mg/dL — ABNORMAL HIGH (ref 0–99)
Triglycerides: 198 mg/dL — ABNORMAL HIGH (ref 0–149)
VLDL Cholesterol Cal: 35 mg/dL (ref 5–40)

## 2024-04-13 LAB — B12 AND FOLATE PANEL
Folate: 7.8 ng/mL (ref >3.0)
Vitamin B-12: 1004 pg/mL (ref 232–1245)

## 2024-04-13 LAB — VITAMIN D 25 HYDROXY (VIT D DEFICIENCY, FRACTURES): Vit D, 25-Hydroxy: 18.9 ng/mL — ABNORMAL LOW (ref 30.0–100.0)

## 2024-04-13 LAB — TSH: TSH: 1.11 u[IU]/mL (ref 0.450–4.500)

## 2024-04-13 LAB — VITAMIN B1: Thiamine: 90.2 nmol/L (ref 66.5–200.0)

## 2024-04-14 ENCOUNTER — Encounter: Payer: Self-pay | Admitting: Family Medicine

## 2024-04-17 ENCOUNTER — Other Ambulatory Visit: Payer: Self-pay | Admitting: Family Medicine

## 2024-04-17 DIAGNOSIS — F1021 Alcohol dependence, in remission: Secondary | ICD-10-CM

## 2024-04-22 ENCOUNTER — Encounter: Payer: Self-pay | Admitting: Family Medicine

## 2024-04-22 MED ORDER — AMLODIPINE BESYLATE 10 MG PO TABS: 10.0000 mg | ORAL_TABLET | Freq: Every morning | ORAL | 0 refills | Status: AC

## 2024-04-22 MED ORDER — DULOXETINE HCL 60 MG PO CPEP: 60.0000 mg | ORAL_CAPSULE | Freq: Every day | ORAL | 0 refills | Status: DC

## 2024-04-22 MED ORDER — GUANFACINE HCL ER 2 MG PO TB24: 2.0000 mg | ORAL_TABLET | Freq: Every day | ORAL | 0 refills | Status: DC

## 2024-04-22 NOTE — Addendum Note (Signed)
 Addended by: YVONE PIERCE C on: 04/22/2024 10:10 AM   Modules accepted: Orders

## 2024-04-25 ENCOUNTER — Other Ambulatory Visit: Payer: Self-pay

## 2024-05-05 ENCOUNTER — Other Ambulatory Visit: Payer: Self-pay | Admitting: Family Medicine

## 2024-05-05 DIAGNOSIS — I10 Essential (primary) hypertension: Secondary | ICD-10-CM

## 2024-05-05 DIAGNOSIS — I471 Supraventricular tachycardia, unspecified: Secondary | ICD-10-CM

## 2024-05-05 DIAGNOSIS — F5101 Primary insomnia: Secondary | ICD-10-CM

## 2024-05-07 NOTE — Telephone Encounter (Signed)
 Olmesartan  dscontinued by provider on 04/10/24, Atenolol  dosage changed by provider on 04/10/24, refusing due to this.    Requested Prescriptions  Pending Prescriptions Disp Refills   traZODone  (DESYREL ) 100 MG tablet [Pharmacy Med Name: traZODone  HCl 100 MG Oral Tablet] 90 tablet 0    Sig: TAKE 1 TABLET BY MOUTH AT  BEDTIME     Psychiatry: Antidepressants - Serotonin Modulator Passed - 05/07/2024 10:33 AM      Passed - Valid encounter within last 6 months    Recent Outpatient Visits           3 weeks ago Alcoholism in remission [facility])   [place] [facility] Glenard Mire, MD   5 months ago SVT (supraventricular tachycardia)   [place] [facility] Glenard Mire, MD   10 months ago Mild dilation of ascending aorta   [facility] Glenard Mire, MD              Refused Prescriptions Disp Refills   olmesartan  (BENICAR ) 40 MG tablet [Pharmacy Med Name: Olmesartan  Medoxomil 40 MG Oral Tablet] 90 tablet 3    Sig: TAKE 1 TABLET BY MOUTH DAILY     Cardiovascular:  Angiotensin Receptor Blockers Failed - 05/07/2024 10:33 AM      Failed - Cr in normal range and within 180 days    Creatinine, Ser  Date Value Ref Range Status  04/10/2024 1.33 (H) 0.76 - 1.27 mg/dL Final         Passed - K in normal range and within 180 days    Potassium  Date Value Ref Range Status  04/10/2024 4.3 3.5 - 5.2 mmol/L Final         Passed - Patient is not pregnant      Passed - Last BP in normal range    BP Readings from Last 1 Encounters:  04/10/24 114/76         Passed - Valid encounter within last 6 months    Recent Outpatient Visits           3 weeks ago Alcoholism in remission [facility])   [place] [facility] Glenard Mire, MD   5 months ago SVT (supraventricular tachycardia)   [place] [facility] Glenard Mire, MD   10 months ago Mild dilation of ascending aorta   [facility] Decker, Krichna, MD               atenolol  (TENORMIN ) 25 MG tablet [Pharmacy Med Name: Atenolol  25 MG Oral Tablet] 90 tablet 3    Sig: TAKE 1 TABLET BY MOUTH EVERY  EVENING     Cardiovascular: Beta Blockers 2 Failed - 05/07/2024 10:33 AM      Failed - Cr in normal range and within 360 days    Creatinine, Ser  Date Value Ref Range Status  04/10/2024 1.33 (H) 0.76 - 1.27 mg/dL Final         Passed - Last BP in normal range    BP Readings from Last 1 Encounters:  04/10/24 114/76         Passed - Last Heart Rate in normal range    Pulse Readings from Last 1 Encounters:  04/10/24 61         Passed - Valid encounter within last 6 months    Recent Outpatient Visits           3 weeks ago Alcoholism in remission (HCC)   [facility] Glenard Mire, MD   5 months ago SVT (supraventricular tachycardia)   [place] [facility] Glenard Mire, MD   10 months ago Mild dilation of ascending aorta   [facility] Sowles, Krichna, MD

## 2024-05-20 ENCOUNTER — Other Ambulatory Visit: Payer: Self-pay

## 2024-05-20 ENCOUNTER — Other Ambulatory Visit: Payer: Self-pay | Admitting: Family Medicine

## 2024-05-21 ENCOUNTER — Other Ambulatory Visit: Payer: Self-pay

## 2024-05-21 MED ORDER — WEGOVY 0.5 MG/0.5ML ~~LOC~~ SOAJ
0.5000 mg | SUBCUTANEOUS | 0 refills | Status: DC
  Filled 2024-05-21: qty 2, 28d supply, fill #0

## 2024-05-21 NOTE — Telephone Encounter (Signed)
 Requested medication (s) are due for refill today - yes  Requested medication (s) are on the active medication list -yes  Future visit scheduled -yes  Last refill: 04/10/24 2ml- for 0.25 and 0.5mg  dosing  Notes to clinic: Pharmacy comment: 2 pens malfunctioned, calling [employer]  for replacement voucher, need new script please   Requested Prescriptions  Pending Prescriptions Disp Refills   semaglutide -weight management (WEGOVY ) 0.5 MG/0.5ML SOAJ SQ injection 2 mL 0    Sig: Inject 0.5 mg into the skin once a week.     Endocrinology:  Diabetes - GLP-1 Receptor Agonists - semaglutide  Failed - 05/21/2024 12:39 PM      Failed - HBA1C in normal range and within 180 days    Hemoglobin A1C  Date Value Ref Range Status  01/05/2016 5.3  Final   Hgb A1c MFr Bld  Date Value Ref Range Status  04/10/2024 6.0 (H) 4.8 - 5.6 % Final    Comment:             Prediabetes: 5.7 - 6.4          Diabetes: >6.4          Glycemic control for adults with diabetes: <7.0          Failed - Cr in normal range and within 360 days    Creatinine, Ser  Date Value Ref Range Status  04/10/2024 1.33 (H) 0.76 - 1.27 mg/dL Final         Passed - Valid encounter within last 6 months    Recent Outpatient Visits           1 month ago Alcoholism in remission [facility])   [facility] [place] M[place], MD   5 months ago SVT (supraventricular tachycardia)   [facility] [place] Mire, MD   10 months ago Mild dilation of ascending aorta   [facility] [place] Mire, MD                 Requested Prescriptions  Pending Prescriptions Disp Refills   semaglutide -weight management (WEGOVY ) 0.5 MG/0.5ML SOAJ SQ injection 2 mL 0    Sig: Inject 0.5 mg into the skin once a week.     Endocrinology:  Diabetes - GLP-1 Receptor Agonists - semaglutide  Failed - 05/21/2024 12:39 PM      Failed - HBA1C in normal range and within 180 days     Hemoglobin A1C  Date Value Ref Range Status  01/05/2016 5.3  Final   Hgb A1c MFr Bld  Date Value Ref Range Status  04/10/2024 6.0 (H) 4.8 - 5.6 % Final    Comment:             Prediabetes: 5.7 - 6.4          Diabetes: >6.4          Glycemic control for adults with diabetes: <7.0          Failed - Cr in normal range and within 360 days    Creatinine, Ser  Date Value Ref Range Status  04/10/2024 1.33 (H) 0.76 - 1.27 mg/dL Final         Passed - Valid encounter within last 6 months    Recent Outpatient Visits           1 month ago Alcoholism in remission [facility])   [place] [place] Mire, MD   5 months ago SVT (supraventricular tachycardia)    [facility] [place] Mire, MD   10 months ago Mild dilation of ascending aorta   [facility] S[facility], Krichna, MD

## 2024-05-22 ENCOUNTER — Ambulatory Visit: Admitting: Family Medicine

## 2024-05-22 ENCOUNTER — Other Ambulatory Visit: Payer: Self-pay

## 2024-06-02 ENCOUNTER — Ambulatory Visit (INDEPENDENT_AMBULATORY_CARE_PROVIDER_SITE_OTHER): Admitting: Family Medicine

## 2024-06-02 ENCOUNTER — Other Ambulatory Visit: Payer: Self-pay

## 2024-06-02 ENCOUNTER — Encounter: Payer: Self-pay | Admitting: Family Medicine

## 2024-06-02 DIAGNOSIS — R7303 Prediabetes: Secondary | ICD-10-CM

## 2024-06-02 DIAGNOSIS — I10 Essential (primary) hypertension: Secondary | ICD-10-CM | POA: Diagnosis not present

## 2024-06-02 DIAGNOSIS — N1831 Chronic kidney disease, stage 3a: Secondary | ICD-10-CM | POA: Diagnosis not present

## 2024-06-02 DIAGNOSIS — K219 Gastro-esophageal reflux disease without esophagitis: Secondary | ICD-10-CM | POA: Diagnosis not present

## 2024-06-02 DIAGNOSIS — F901 Attention-deficit hyperactivity disorder, predominantly hyperactive type: Secondary | ICD-10-CM

## 2024-06-02 DIAGNOSIS — F1021 Alcohol dependence, in remission: Secondary | ICD-10-CM

## 2024-06-02 DIAGNOSIS — F5221 Male erectile disorder: Secondary | ICD-10-CM | POA: Diagnosis not present

## 2024-06-02 DIAGNOSIS — D500 Iron deficiency anemia secondary to blood loss (chronic): Secondary | ICD-10-CM

## 2024-06-02 DIAGNOSIS — F341 Dysthymic disorder: Secondary | ICD-10-CM | POA: Diagnosis not present

## 2024-06-02 MED ORDER — DULOXETINE HCL 60 MG PO CPEP: 60.0000 mg | ORAL_CAPSULE | Freq: Every day | ORAL | 0 refills | Status: AC

## 2024-06-02 MED ORDER — WEGOVY 0.5 MG/0.5ML ~~LOC~~ SOAJ
0.5000 mg | SUBCUTANEOUS | 0 refills | Status: AC
  Filled 2024-06-02 – 2024-06-12 (×2): qty 2, 28d supply, fill #0

## 2024-06-02 MED ORDER — ATENOLOL 50 MG PO TABS: 50.0000 mg | ORAL_TABLET | Freq: Every evening | ORAL | 0 refills | Status: AC

## 2024-06-02 MED ORDER — GUANFACINE HCL ER 4 MG PO TB24: 4.0000 mg | ORAL_TABLET | Freq: Every day | ORAL | 0 refills | Status: AC

## 2024-06-02 MED ORDER — OMEPRAZOLE 40 MG PO CPDR: 40.0000 mg | DELAYED_RELEASE_CAPSULE | Freq: Two times a day (BID) | ORAL | 0 refills | Status: AC

## 2024-06-02 MED ORDER — OLMESARTAN MEDOXOMIL 40 MG PO TABS: 40.0000 mg | ORAL_TABLET | Freq: Every morning | ORAL | 0 refills | Status: AC

## 2024-06-02 MED ORDER — TADALAFIL 20 MG PO TABS
20.0000 mg | ORAL_TABLET | ORAL | 1 refills | Status: AC | PRN
  Filled 2024-06-02: qty 30, 60d supply, fill #0
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
## 2024-06-03 ENCOUNTER — Other Ambulatory Visit: Payer: Self-pay

## 2024-06-04 ENCOUNTER — Other Ambulatory Visit: Payer: Self-pay

## 2024-06-04 ENCOUNTER — Other Ambulatory Visit (HOSPITAL_COMMUNITY): Payer: Self-pay

## 2024-06-04 ENCOUNTER — Telehealth: Payer: Self-pay | Admitting: Pharmacy Technician

## 2024-06-04 NOTE — Telephone Encounter (Signed)
 Pharmacy Patient Advocate Encounter  Received notification from [employer] that Prior Authorization for Omeprazole  40MG  dr capsules has been APPROVED from 06/04/24 to 06/04/25. Ran test claim, Copay is $9.73. This test claim was processed through [facility]- copay amounts may vary at other pharmacies due to pharmacy/plan contracts, or as the patient moves through the different stages of their insurance plan.   PA #/Case ID/Reference #: [id number]

## 2024-06-04 NOTE — Telephone Encounter (Signed)
 Pharmacy Patient Advocate Encounter   Received notification from [facility] KEY that prior authorization for Omeprazole  40MG  dr capsules  is required/requested.   Insurance verification completed.   The patient is insured through [facility].   Per test claim: PA required; PA started via CoverMyMeds. KEY [id number] . Waiting for clinical questions to populate.

## 2024-06-09 ENCOUNTER — Other Ambulatory Visit: Payer: Self-pay

## 2024-06-12 ENCOUNTER — Other Ambulatory Visit: Payer: Self-pay

## 2024-09-10 ENCOUNTER — Ambulatory Visit: Admitting: Family Medicine
